# Patient Record
Sex: Male | Born: 1975 | Race: Black or African American | Hispanic: No | Smoking: Current every day smoker
Health system: Southern US, Community
[De-identification: ages and names within clinical notes are randomized; demographics above are authoritative.]

## PROBLEM LIST (undated history)

## (undated) DIAGNOSIS — D571 Sickle-cell disease without crisis: Secondary | ICD-10-CM

## (undated) DIAGNOSIS — R011 Cardiac murmur, unspecified: Secondary | ICD-10-CM

## (undated) HISTORY — PX: CHOLECYSTECTOMY: SHX55

## (undated) HISTORY — DX: Sickle-cell disease without crisis: D57.1

---

## 2008-04-29 ENCOUNTER — Ambulatory Visit (HOSPITAL_BASED_OUTPATIENT_CLINIC_OR_DEPARTMENT_OTHER): Payer: Medicaid Other | Admitting: Hematology and Oncology

## 2010-08-10 ENCOUNTER — Ambulatory Visit: Payer: Self-pay | Admitting: Hematology & Oncology

## 2010-08-23 ENCOUNTER — Ambulatory Visit: Payer: Self-pay | Admitting: Hematology & Oncology

## 2010-09-06 ENCOUNTER — Encounter (HOSPITAL_COMMUNITY)
Admission: RE | Admit: 2010-09-06 | Discharge: 2010-09-06 | Disposition: A | Discharge status: Home / Self Care | Payer: Medicaid Other | Source: Ambulatory Visit | Attending: Hematology & Oncology | Admitting: Hematology & Oncology

## 2010-09-06 ENCOUNTER — Ambulatory Visit (HOSPITAL_BASED_OUTPATIENT_CLINIC_OR_DEPARTMENT_OTHER): Payer: Medicaid Other | Admitting: Hematology & Oncology

## 2010-09-06 ENCOUNTER — Other Ambulatory Visit: Payer: Self-pay | Admitting: Hematology & Oncology

## 2010-09-06 DIAGNOSIS — D57219 Sickle-cell/Hb-C disease with crisis, unspecified: Secondary | ICD-10-CM

## 2010-09-06 DIAGNOSIS — D649 Anemia, unspecified: Secondary | ICD-10-CM | POA: Insufficient documentation

## 2010-09-06 LAB — CBC WITH DIFFERENTIAL (CANCER CENTER ONLY)
BASO#: 0.1 10*3/uL (ref 0.0–0.2)
BASO%: 0.6 % (ref 0.0–2.0)
EOS%: 3 % (ref 0.0–7.0)
Eosinophils Absolute: 0.6 10*3/uL — ABNORMAL HIGH (ref 0.0–0.5)
HCT: 18.5 % — ABNORMAL LOW (ref 38.7–49.9)
HGB: 6.5 g/dL — CL (ref 13.0–17.1)
LYMPH#: 4.3 10*3/uL — ABNORMAL HIGH (ref 0.9–3.3)
LYMPH%: 22.2 % (ref 14.0–48.0)
MCH: 33.3 pg (ref 28.0–33.4)
MCHC: 35.1 g/dL (ref 32.0–35.9)
MCV: 95 fL (ref 82–98)
MONO#: 2 10*3/uL — ABNORMAL HIGH (ref 0.1–0.9)
MONO%: 10.5 % (ref 0.0–13.0)
NEUT#: 12.4 10*3/uL — ABNORMAL HIGH (ref 1.5–6.5)
NEUT%: 63.7 % (ref 40.0–80.0)
Platelets: 432 10*3/uL — ABNORMAL HIGH (ref 145–400)
RBC: 1.95 10*6/uL — ABNORMAL LOW (ref 4.20–5.70)
RDW: 20.7 % — ABNORMAL HIGH (ref 11.1–15.7)
WBC: 19.4 10*3/uL — ABNORMAL HIGH (ref 4.0–10.0)

## 2010-09-06 LAB — ABO/RH: ABO/RH(D): O POS

## 2010-09-07 ENCOUNTER — Encounter (HOSPITAL_BASED_OUTPATIENT_CLINIC_OR_DEPARTMENT_OTHER): Payer: Medicaid Other | Admitting: Hematology & Oncology

## 2010-09-07 DIAGNOSIS — D571 Sickle-cell disease without crisis: Secondary | ICD-10-CM

## 2010-09-08 LAB — COMPREHENSIVE METABOLIC PANEL
ALT: 23 U/L (ref 0–53)
Albumin: 3.9 g/dL (ref 3.5–5.2)
Alkaline Phosphatase: 112 U/L (ref 39–117)
CO2: 21 mEq/L (ref 19–32)
Glucose, Bld: 96 mg/dL (ref 70–99)
Potassium: 4 mEq/L (ref 3.5–5.3)
Sodium: 138 mEq/L (ref 135–145)
Total Bilirubin: 7 mg/dL — ABNORMAL HIGH (ref 0.3–1.2)
Total Protein: 7.4 g/dL (ref 6.0–8.3)

## 2010-09-08 LAB — CROSSMATCH
ABO/RH(D): O POS
Antibody Screen: NEGATIVE
Unit division: 0

## 2010-09-08 LAB — HEMOGLOBINOPATHY EVALUATION
Hemoglobin Other: 0 % (ref 0.0–0.0)
Hgb A: 26.2 % — ABNORMAL LOW (ref 96.8–97.8)
Hgb S Quant: 61.5 % — ABNORMAL HIGH (ref 0.0–0.0)

## 2010-09-08 LAB — TYPE & CROSSMATCH - CHCC SATELLITE

## 2010-09-08 LAB — LACTATE DEHYDROGENASE: LDH: 297 U/L — ABNORMAL HIGH (ref 94–250)

## 2010-09-10 ENCOUNTER — Encounter (HOSPITAL_COMMUNITY): Payer: Medicaid Other

## 2010-09-11 ENCOUNTER — Emergency Department (HOSPITAL_BASED_OUTPATIENT_CLINIC_OR_DEPARTMENT_OTHER)
Admission: EM | Admit: 2010-09-11 | Discharge: 2010-09-12 | Disposition: A | Discharge status: Home / Self Care | Payer: Medicaid Other | Attending: Emergency Medicine | Admitting: Emergency Medicine

## 2010-09-11 DIAGNOSIS — M545 Low back pain, unspecified: Secondary | ICD-10-CM | POA: Insufficient documentation

## 2010-09-11 DIAGNOSIS — F17200 Nicotine dependence, unspecified, uncomplicated: Secondary | ICD-10-CM | POA: Insufficient documentation

## 2010-09-11 DIAGNOSIS — D5700 Hb-SS disease with crisis, unspecified: Secondary | ICD-10-CM | POA: Insufficient documentation

## 2010-09-12 LAB — RETICULOCYTES
RBC.: 2.4 MIL/uL — ABNORMAL LOW (ref 4.22–5.81)
Retic Count, Absolute: 268.8 10*3/uL — ABNORMAL HIGH (ref 19.0–186.0)
Retic Ct Pct: 11.2 % — ABNORMAL HIGH (ref 0.4–3.1)

## 2010-09-12 LAB — BASIC METABOLIC PANEL
CO2: 25 mEq/L (ref 19–32)
Chloride: 110 mEq/L (ref 96–112)
Creatinine, Ser: 0.6 mg/dL (ref 0.4–1.5)
GFR calc Af Amer: >60 mL/min (ref >60)
Sodium: 145 mEq/L (ref 135–145)

## 2010-09-12 LAB — CBC
Hemoglobin: 7.5 g/dL — ABNORMAL LOW (ref 13.0–17.0)
MCH: 32.2 pg (ref 26.0–34.0)
Platelets: 335 10*3/uL (ref 150–400)
RBC: 2.33 MIL/uL — ABNORMAL LOW (ref 4.22–5.81)
WBC: 19.9 10*3/uL — ABNORMAL HIGH (ref 4.0–10.5)

## 2010-09-12 LAB — DIFFERENTIAL
Basophils Absolute: 0.2 10*3/uL — ABNORMAL HIGH (ref 0.0–0.1)
Eosinophils Absolute: 0.6 10*3/uL (ref 0.0–0.7)
Lymphocytes Relative: 32 % (ref 12–46)
Monocytes Relative: 10 % (ref 3–12)
Neutro Abs: 10.7 10*3/uL — ABNORMAL HIGH (ref 1.7–7.7)
Neutrophils Relative %: 54 % (ref 43–77)

## 2010-09-21 ENCOUNTER — Other Ambulatory Visit: Payer: Self-pay | Admitting: Hematology & Oncology

## 2010-09-21 ENCOUNTER — Encounter (HOSPITAL_BASED_OUTPATIENT_CLINIC_OR_DEPARTMENT_OTHER): Payer: Medicaid Other | Admitting: Hematology & Oncology

## 2010-09-21 DIAGNOSIS — D571 Sickle-cell disease without crisis: Secondary | ICD-10-CM

## 2010-09-21 DIAGNOSIS — D57219 Sickle-cell/Hb-C disease with crisis, unspecified: Secondary | ICD-10-CM

## 2010-09-21 LAB — CBC WITH DIFFERENTIAL (CANCER CENTER ONLY)
BASO%: 0.4 % (ref 0.0–2.0)
LYMPH%: 20.1 % (ref 14.0–48.0)
MCH: 33.3 pg (ref 28.0–33.4)
MCV: 95 fL (ref 82–98)
MONO#: 2.5 10*3/uL — ABNORMAL HIGH (ref 0.1–0.9)
MONO%: 11.5 % (ref 0.0–13.0)
NEUT#: 14.4 10*3/uL — ABNORMAL HIGH (ref 1.5–6.5)
Platelets: 315 10*3/uL (ref 145–400)
RDW: 18.9 % — ABNORMAL HIGH (ref 11.1–15.7)
WBC: 22.1 10*3/uL — ABNORMAL HIGH (ref 4.0–10.0)

## 2010-09-21 LAB — RETICULOCYTES (CHCC): Retic Ct Pct: 13.7 % — ABNORMAL HIGH (ref 0.4–3.1)

## 2010-09-21 LAB — CHCC SATELLITE - SMEAR

## 2010-09-29 ENCOUNTER — Encounter (HOSPITAL_BASED_OUTPATIENT_CLINIC_OR_DEPARTMENT_OTHER): Payer: Medicaid Other | Admitting: Hematology & Oncology

## 2010-09-29 ENCOUNTER — Other Ambulatory Visit: Payer: Self-pay | Admitting: Hematology & Oncology

## 2010-09-29 DIAGNOSIS — D57219 Sickle-cell/Hb-C disease with crisis, unspecified: Secondary | ICD-10-CM

## 2010-09-29 DIAGNOSIS — D571 Sickle-cell disease without crisis: Secondary | ICD-10-CM

## 2010-09-29 LAB — CBC WITH DIFFERENTIAL (CANCER CENTER ONLY)
BASO#: 0.1 10*3/uL (ref 0.0–0.2)
Eosinophils Absolute: 0.2 10*3/uL (ref 0.0–0.5)
HCT: 20 % — ABNORMAL LOW (ref 38.7–49.9)
HGB: 7.2 g/dL — ABNORMAL LOW (ref 13.0–17.1)
LYMPH%: 22.7 % (ref 14.0–48.0)
MCH: 34.4 pg — ABNORMAL HIGH (ref 28.0–33.4)
MCV: 96 fL (ref 82–98)
MONO%: 9.6 % (ref 0.0–13.0)
NEUT%: 65.9 % (ref 40.0–80.0)
RBC: 2.09 10*6/uL — ABNORMAL LOW (ref 4.20–5.70)

## 2010-09-30 ENCOUNTER — Inpatient Hospital Stay (HOSPITAL_COMMUNITY)
Admission: AD | Admit: 2010-09-30 | Payer: Self-pay | Source: Other Acute Inpatient Hospital | Admitting: Internal Medicine

## 2010-09-30 ENCOUNTER — Emergency Department (INDEPENDENT_AMBULATORY_CARE_PROVIDER_SITE_OTHER): Payer: Medicaid Other

## 2010-09-30 ENCOUNTER — Emergency Department (HOSPITAL_BASED_OUTPATIENT_CLINIC_OR_DEPARTMENT_OTHER)
Admission: EM | Admit: 2010-09-30 | Discharge: 2010-09-30 | Disposition: A | Discharge status: Home / Self Care | Payer: Medicaid Other | Attending: Emergency Medicine | Admitting: Emergency Medicine

## 2010-09-30 DIAGNOSIS — J45909 Unspecified asthma, uncomplicated: Secondary | ICD-10-CM | POA: Insufficient documentation

## 2010-09-30 DIAGNOSIS — M545 Low back pain, unspecified: Secondary | ICD-10-CM | POA: Insufficient documentation

## 2010-09-30 DIAGNOSIS — D571 Sickle-cell disease without crisis: Secondary | ICD-10-CM

## 2010-09-30 DIAGNOSIS — D5700 Hb-SS disease with crisis, unspecified: Secondary | ICD-10-CM | POA: Insufficient documentation

## 2010-09-30 DIAGNOSIS — F17200 Nicotine dependence, unspecified, uncomplicated: Secondary | ICD-10-CM | POA: Insufficient documentation

## 2010-09-30 LAB — DIFFERENTIAL
Basophils Absolute: 0.2 10*3/uL — ABNORMAL HIGH (ref 0.0–0.1)
Lymphocytes Relative: 26 % (ref 12–46)
Monocytes Relative: 11 % (ref 3–12)
Neutro Abs: 11.7 10*3/uL — ABNORMAL HIGH (ref 1.7–7.7)

## 2010-09-30 LAB — CBC
HCT: 20 % — ABNORMAL LOW (ref 39.0–52.0)
Hemoglobin: 7.2 g/dL — ABNORMAL LOW (ref 13.0–17.0)
MCHC: 36 g/dL (ref 30.0–36.0)
WBC: 19.8 10*3/uL — ABNORMAL HIGH (ref 4.0–10.5)

## 2010-09-30 LAB — POCT TOXICOLOGY PANEL: Tetrahydrocannabinol: POSITIVE

## 2010-09-30 LAB — URINE MICROSCOPIC-ADD ON: RBC / HPF: NONE SEEN RBC/hpf (ref <3)

## 2010-09-30 LAB — COMPREHENSIVE METABOLIC PANEL
AST: 65 U/L — ABNORMAL HIGH (ref 0–37)
Albumin: 4.1 g/dL (ref 3.5–5.2)
BUN: 9 mg/dL (ref 6–23)
Calcium: 8.8 mg/dL (ref 8.4–10.5)
Creatinine, Ser: 0.8 mg/dL (ref 0.4–1.5)
Total Bilirubin: 6 mg/dL — ABNORMAL HIGH (ref 0.3–1.2)
Total Protein: 8.2 g/dL (ref 6.0–8.3)

## 2010-09-30 LAB — URINALYSIS, ROUTINE W REFLEX MICROSCOPIC
Glucose, UA: NEGATIVE mg/dL
Hgb urine dipstick: NEGATIVE
Ketones, ur: NEGATIVE mg/dL
Leukocytes, UA: NEGATIVE
pH: 6 (ref 5.0–8.0)

## 2010-09-30 LAB — RETICULOCYTES
RBC.: 2.12 MIL/uL — ABNORMAL LOW (ref 4.22–5.81)
Retic Ct Pct: 22 % — ABNORMAL HIGH (ref 0.4–3.1)

## 2010-10-01 LAB — RETICULOCYTES (CHCC): RBC.: 2.18 MIL/uL — ABNORMAL LOW (ref 4.22–5.81)

## 2010-10-01 LAB — HEMOGLOBINOPATHY EVALUATION
Hemoglobin Other: 0 % (ref 0.0–0.0)
Hgb A2 Quant: 2.7 % (ref 2.2–3.2)
Hgb A: 26.5 % — ABNORMAL LOW (ref 96.8–97.8)
Hgb F Quant: 10.5 % — ABNORMAL HIGH (ref 0.0–2.0)
Hgb S Quant: 60.3 % — ABNORMAL HIGH (ref 0.0–0.0)

## 2010-10-01 LAB — FERRITIN: Ferritin: 3623 ng/mL — ABNORMAL HIGH (ref 22–322)

## 2010-10-06 ENCOUNTER — Ambulatory Visit (HOSPITAL_COMMUNITY)
Admission: RE | Admit: 2010-10-06 | Discharge: 2010-10-06 | Disposition: A | Discharge status: Home / Self Care | Payer: Medicaid Other | Source: Ambulatory Visit | Attending: Hematology & Oncology | Admitting: Hematology & Oncology

## 2010-10-06 DIAGNOSIS — J45909 Unspecified asthma, uncomplicated: Secondary | ICD-10-CM | POA: Insufficient documentation

## 2010-10-06 DIAGNOSIS — R011 Cardiac murmur, unspecified: Secondary | ICD-10-CM | POA: Insufficient documentation

## 2010-10-06 DIAGNOSIS — I319 Disease of pericardium, unspecified: Secondary | ICD-10-CM

## 2010-10-06 DIAGNOSIS — D5720 Sickle-cell/Hb-C disease without crisis: Secondary | ICD-10-CM | POA: Insufficient documentation

## 2010-10-11 ENCOUNTER — Encounter (HOSPITAL_BASED_OUTPATIENT_CLINIC_OR_DEPARTMENT_OTHER): Payer: Medicaid Other | Admitting: Hematology & Oncology

## 2010-10-11 DIAGNOSIS — D571 Sickle-cell disease without crisis: Secondary | ICD-10-CM

## 2010-10-13 ENCOUNTER — Encounter: Payer: Medicaid Other | Admitting: Hematology & Oncology

## 2010-10-20 ENCOUNTER — Encounter (HOSPITAL_BASED_OUTPATIENT_CLINIC_OR_DEPARTMENT_OTHER): Payer: Medicaid Other | Admitting: Hematology & Oncology

## 2010-10-20 ENCOUNTER — Other Ambulatory Visit: Payer: Self-pay | Admitting: Hematology & Oncology

## 2010-10-20 DIAGNOSIS — D571 Sickle-cell disease without crisis: Secondary | ICD-10-CM

## 2010-10-20 LAB — CBC WITH DIFFERENTIAL (CANCER CENTER ONLY)
BASO#: 0.1 10*3/uL (ref 0.0–0.2)
BASO%: 0.5 % (ref 0.0–2.0)
EOS%: 3.5 % (ref 0.0–7.0)
HGB: 6.6 g/dL — CL (ref 13.0–17.1)
LYMPH#: 5.2 10*3/uL — ABNORMAL HIGH (ref 0.9–3.3)
MCH: 34.7 pg — ABNORMAL HIGH (ref 28.0–33.4)
MCHC: 35.5 g/dL (ref 32.0–35.9)
MONO%: 10.5 % (ref 0.0–13.0)
NEUT#: 9 10*3/uL — ABNORMAL HIGH (ref 1.5–6.5)
Platelets: 358 10*3/uL (ref 145–400)
RDW: 21.9 % — ABNORMAL HIGH (ref 11.1–15.7)

## 2010-10-20 LAB — CHCC SATELLITE - SMEAR

## 2010-10-20 LAB — TECHNOLOGIST REVIEW CHCC SATELLITE

## 2010-10-21 ENCOUNTER — Encounter (HOSPITAL_COMMUNITY)
Admission: RE | Admit: 2010-10-21 | Discharge: 2010-10-21 | Disposition: A | Discharge status: Home / Self Care | Payer: Medicaid Other | Source: Ambulatory Visit | Attending: Hematology & Oncology | Admitting: Hematology & Oncology

## 2010-10-21 ENCOUNTER — Other Ambulatory Visit: Payer: Self-pay | Admitting: Hematology & Oncology

## 2010-10-21 ENCOUNTER — Encounter (HOSPITAL_BASED_OUTPATIENT_CLINIC_OR_DEPARTMENT_OTHER): Payer: Medicaid Other | Admitting: Hematology & Oncology

## 2010-10-21 DIAGNOSIS — D571 Sickle-cell disease without crisis: Secondary | ICD-10-CM

## 2010-10-21 DIAGNOSIS — D649 Anemia, unspecified: Secondary | ICD-10-CM | POA: Insufficient documentation

## 2010-10-22 ENCOUNTER — Encounter (HOSPITAL_BASED_OUTPATIENT_CLINIC_OR_DEPARTMENT_OTHER): Payer: Medicaid Other | Admitting: Hematology & Oncology

## 2010-10-22 DIAGNOSIS — D571 Sickle-cell disease without crisis: Secondary | ICD-10-CM

## 2010-10-22 LAB — TYPE & CROSSMATCH - CHCC SATELLITE

## 2010-10-23 LAB — CROSSMATCH
ABO/RH(D): O POS
Unit division: 0

## 2010-10-25 ENCOUNTER — Encounter (HOSPITAL_COMMUNITY): Payer: Medicaid Other

## 2010-10-25 LAB — RETICULOCYTES (CHCC)
RBC.: 1.94 MIL/uL — ABNORMAL LOW (ref 4.22–5.81)
Retic Ct Pct: 19.8 % — ABNORMAL HIGH (ref 0.4–3.1)

## 2010-10-25 LAB — HEMOGLOBINOPATHY EVALUATION
Hemoglobin Other: 0 % (ref 0.0–0.0)
Hgb A: 9.9 % — ABNORMAL LOW (ref 96.8–97.8)
Hgb S Quant: 76.1 % — ABNORMAL HIGH (ref 0.0–0.0)

## 2010-10-25 LAB — LACTATE DEHYDROGENASE: LDH: 436 U/L — ABNORMAL HIGH (ref 94–250)

## 2010-10-25 LAB — HAPTOGLOBIN: Haptoglobin: <6 mg/dL — ABNORMAL LOW (ref 16–200)

## 2010-11-10 ENCOUNTER — Encounter: Payer: Medicaid Other | Admitting: Hematology & Oncology

## 2010-11-10 ENCOUNTER — Other Ambulatory Visit: Payer: Self-pay | Admitting: Hematology & Oncology

## 2010-11-10 DIAGNOSIS — D649 Anemia, unspecified: Secondary | ICD-10-CM

## 2010-11-10 LAB — CBC WITH DIFFERENTIAL (CANCER CENTER ONLY)
BASO#: 0.1 10*3/uL (ref 0.0–0.2)
Eosinophils Absolute: 0.7 10*3/uL — ABNORMAL HIGH (ref 0.0–0.5)
HCT: 17.7 % — ABNORMAL LOW (ref 38.7–49.9)
HGB: 6.4 g/dL — CL (ref 13.0–17.1)
LYMPH#: 4.1 10*3/uL — ABNORMAL HIGH (ref 0.9–3.3)
NEUT#: 9.4 10*3/uL — ABNORMAL HIGH (ref 1.5–6.5)
NEUT%: 59.2 % (ref 40.0–80.0)
RBC: 1.86 10*6/uL — ABNORMAL LOW (ref 4.20–5.70)

## 2010-11-11 ENCOUNTER — Encounter (HOSPITAL_BASED_OUTPATIENT_CLINIC_OR_DEPARTMENT_OTHER): Payer: Medicaid Other | Admitting: Hematology & Oncology

## 2010-11-11 ENCOUNTER — Other Ambulatory Visit: Payer: Self-pay | Admitting: Hematology & Oncology

## 2010-11-11 DIAGNOSIS — D571 Sickle-cell disease without crisis: Secondary | ICD-10-CM

## 2010-11-11 DIAGNOSIS — D649 Anemia, unspecified: Secondary | ICD-10-CM

## 2010-11-11 DIAGNOSIS — D57219 Sickle-cell/Hb-C disease with crisis, unspecified: Secondary | ICD-10-CM

## 2010-11-11 LAB — BASIC METABOLIC PANEL - CANCER CENTER ONLY
CO2: 26 mEq/L (ref 18–33)
Calcium: 8 mg/dL (ref 8.0–10.3)
Creat: UNDETERMINED mg/dl (ref 0.6–1.2)

## 2010-11-11 LAB — TYPE & CROSSMATCH - CHCC SATELLITE

## 2010-11-12 LAB — CROSSMATCH
ABO/RH(D): O POS
Antibody Screen: NEGATIVE
Unit division: 0
Unit division: 0

## 2010-11-15 ENCOUNTER — Other Ambulatory Visit (HOSPITAL_COMMUNITY): Payer: Medicaid Other

## 2010-12-13 ENCOUNTER — Other Ambulatory Visit: Payer: Self-pay | Admitting: Hematology & Oncology

## 2010-12-13 ENCOUNTER — Encounter (HOSPITAL_COMMUNITY)
Admission: RE | Admit: 2010-12-13 | Discharge: 2010-12-13 | Disposition: A | Discharge status: Home / Self Care | Payer: Medicaid Other | Source: Ambulatory Visit | Attending: Hematology & Oncology | Admitting: Hematology & Oncology

## 2010-12-13 ENCOUNTER — Encounter: Payer: Medicaid Other | Admitting: Hematology & Oncology

## 2010-12-13 DIAGNOSIS — D571 Sickle-cell disease without crisis: Secondary | ICD-10-CM | POA: Insufficient documentation

## 2010-12-13 LAB — CBC WITH DIFFERENTIAL (CANCER CENTER ONLY)
BASO#: 0.1 10*3/uL (ref 0.0–0.2)
EOS%: 2.2 % (ref 0.0–7.0)
HGB: 5.8 g/dL — CL (ref 13.0–17.1)
LYMPH#: 6.5 10*3/uL — ABNORMAL HIGH (ref 0.9–3.3)
MCHC: 36.3 g/dL — ABNORMAL HIGH (ref 32.0–35.9)
MONO#: 1.6 10*3/uL — ABNORMAL HIGH (ref 0.1–0.9)
NEUT#: 9 10*3/uL — ABNORMAL HIGH (ref 1.5–6.5)
RBC: 1.66 10*6/uL — ABNORMAL LOW (ref 4.20–5.70)
WBC: 17.6 10*3/uL — ABNORMAL HIGH (ref 4.0–10.0)

## 2010-12-13 LAB — HOLD TUBE, BLOOD BANK - CHCC SATELLITE

## 2010-12-14 ENCOUNTER — Other Ambulatory Visit: Payer: Self-pay | Admitting: Hematology & Oncology

## 2010-12-14 ENCOUNTER — Encounter (HOSPITAL_BASED_OUTPATIENT_CLINIC_OR_DEPARTMENT_OTHER): Payer: Medicaid Other | Admitting: Hematology & Oncology

## 2010-12-14 DIAGNOSIS — D571 Sickle-cell disease without crisis: Secondary | ICD-10-CM

## 2010-12-15 LAB — CROSSMATCH: Antibody Screen: NEGATIVE

## 2010-12-17 ENCOUNTER — Encounter (HOSPITAL_COMMUNITY): Payer: Medicaid Other

## 2010-12-21 ENCOUNTER — Encounter (HOSPITAL_COMMUNITY): Payer: Medicaid Other

## 2010-12-27 ENCOUNTER — Encounter (HOSPITAL_COMMUNITY): Payer: Medicaid Other

## 2011-01-18 ENCOUNTER — Other Ambulatory Visit: Payer: Self-pay | Admitting: Hematology & Oncology

## 2011-01-18 ENCOUNTER — Encounter (HOSPITAL_COMMUNITY)
Admission: RE | Admit: 2011-01-18 | Discharge: 2011-01-18 | Disposition: A | Discharge status: Home / Self Care | Payer: Medicaid Other | Source: Ambulatory Visit | Attending: Hematology & Oncology | Admitting: Hematology & Oncology

## 2011-01-18 ENCOUNTER — Encounter: Payer: Medicaid Other | Admitting: Hematology & Oncology

## 2011-01-18 DIAGNOSIS — D649 Anemia, unspecified: Secondary | ICD-10-CM | POA: Insufficient documentation

## 2011-01-18 LAB — CBC WITH DIFFERENTIAL (CANCER CENTER ONLY)
BASO#: 0 10*3/uL (ref 0.0–0.2)
BASO%: 0.5 % (ref 0.0–2.0)
HCT: 17.2 % — ABNORMAL LOW (ref 38.7–49.9)
HGB: 6.2 g/dL — CL (ref 13.0–17.1)
LYMPH%: 23.2 % (ref 14.0–48.0)
MCHC: 36 g/dL — ABNORMAL HIGH (ref 32.0–35.9)
MCV: 98 fL (ref 82–98)
MONO#: 0.9 10*3/uL (ref 0.1–0.9)
NEUT%: 63 % (ref 40.0–80.0)
RDW: 19.9 % — ABNORMAL HIGH (ref 11.1–15.7)
WBC: 16.3 10*3/uL — ABNORMAL HIGH (ref 4.0–10.0)

## 2011-01-18 LAB — HOLD TUBE, BLOOD BANK - CHCC SATELLITE

## 2011-01-19 ENCOUNTER — Encounter (HOSPITAL_BASED_OUTPATIENT_CLINIC_OR_DEPARTMENT_OTHER): Payer: Medicaid Other | Admitting: Hematology & Oncology

## 2011-01-19 DIAGNOSIS — D571 Sickle-cell disease without crisis: Secondary | ICD-10-CM

## 2011-01-20 ENCOUNTER — Other Ambulatory Visit: Payer: Self-pay | Admitting: Hematology & Oncology

## 2011-01-20 DIAGNOSIS — D571 Sickle-cell disease without crisis: Secondary | ICD-10-CM

## 2011-01-20 LAB — CROSSMATCH
ABO/RH(D): O POS
Antibody Screen: NEGATIVE
Unit division: 0

## 2011-01-27 ENCOUNTER — Ambulatory Visit (HOSPITAL_COMMUNITY)
Admission: RE | Admit: 2011-01-27 | Discharge: 2011-01-27 | Disposition: A | Discharge status: Home / Self Care | Payer: Medicaid Other | Source: Ambulatory Visit | Attending: Hematology & Oncology | Admitting: Hematology & Oncology

## 2011-01-27 DIAGNOSIS — D571 Sickle-cell disease without crisis: Secondary | ICD-10-CM

## 2011-02-03 ENCOUNTER — Encounter (HOSPITAL_BASED_OUTPATIENT_CLINIC_OR_DEPARTMENT_OTHER): Payer: Medicaid Other | Admitting: Hematology & Oncology

## 2011-02-03 ENCOUNTER — Other Ambulatory Visit: Payer: Self-pay | Admitting: Hematology & Oncology

## 2011-02-03 DIAGNOSIS — D571 Sickle-cell disease without crisis: Secondary | ICD-10-CM

## 2011-02-03 LAB — CBC WITH DIFFERENTIAL (CANCER CENTER ONLY)
BASO#: 0.1 10*3/uL (ref 0.0–0.2)
BASO%: 0.4 % (ref 0.0–2.0)
EOS%: 1.2 % (ref 0.0–7.0)
HGB: 6.4 g/dL — CL (ref 13.0–17.1)
MCH: 34.2 pg — ABNORMAL HIGH (ref 28.0–33.4)
MCHC: 36.4 g/dL — ABNORMAL HIGH (ref 32.0–35.9)
MONO%: 11.5 % (ref 0.0–13.0)
NEUT#: 11.5 10*3/uL — ABNORMAL HIGH (ref 1.5–6.5)
RDW: 20.2 % — ABNORMAL HIGH (ref 11.1–15.7)

## 2011-02-04 ENCOUNTER — Encounter (HOSPITAL_BASED_OUTPATIENT_CLINIC_OR_DEPARTMENT_OTHER): Payer: Medicaid Other | Admitting: Hematology & Oncology

## 2011-02-04 DIAGNOSIS — D571 Sickle-cell disease without crisis: Secondary | ICD-10-CM

## 2011-02-04 LAB — TYPE & CROSSMATCH - CHCC SATELLITE

## 2011-02-05 LAB — CROSSMATCH
ABO/RH(D): O POS
Unit division: 0
Unit division: 0

## 2011-02-10 ENCOUNTER — Other Ambulatory Visit: Payer: Self-pay | Admitting: Hematology & Oncology

## 2011-02-10 ENCOUNTER — Encounter (HOSPITAL_BASED_OUTPATIENT_CLINIC_OR_DEPARTMENT_OTHER): Payer: Medicaid Other | Admitting: Hematology & Oncology

## 2011-02-10 DIAGNOSIS — D571 Sickle-cell disease without crisis: Secondary | ICD-10-CM

## 2011-02-10 LAB — CBC WITH DIFFERENTIAL (CANCER CENTER ONLY)
BASO#: 0.1 10*3/uL (ref 0.0–0.2)
Eosinophils Absolute: 0.3 10*3/uL (ref 0.0–0.5)
HCT: 20.3 % — ABNORMAL LOW (ref 38.7–49.9)
HGB: 7.4 g/dL — ABNORMAL LOW (ref 13.0–17.1)
LYMPH#: 3 10*3/uL (ref 0.9–3.3)
MONO#: 3 10*3/uL — ABNORMAL HIGH (ref 0.1–0.9)
NEUT%: 71.6 % (ref 40.0–80.0)
WBC: 22.3 10*3/uL — ABNORMAL HIGH (ref 4.0–10.0)

## 2011-02-17 ENCOUNTER — Encounter: Payer: Medicaid Other | Admitting: Hematology & Oncology

## 2011-02-17 ENCOUNTER — Other Ambulatory Visit: Payer: Self-pay | Admitting: Hematology & Oncology

## 2011-02-17 ENCOUNTER — Encounter (HOSPITAL_COMMUNITY)
Admission: RE | Admit: 2011-02-17 | Discharge: 2011-02-17 | Disposition: A | Discharge status: Home / Self Care | Payer: Medicaid Other | Source: Ambulatory Visit | Attending: Hematology & Oncology | Admitting: Hematology & Oncology

## 2011-02-17 DIAGNOSIS — D649 Anemia, unspecified: Secondary | ICD-10-CM

## 2011-02-17 LAB — CBC WITH DIFFERENTIAL (CANCER CENTER ONLY)
BASO#: 0.1 10*3/uL (ref 0.0–0.2)
EOS%: 1.3 % (ref 0.0–7.0)
HCT: 19.5 % — ABNORMAL LOW (ref 38.7–49.9)
HGB: 6.9 g/dL — CL (ref 13.0–17.1)
MCH: 33.8 pg — ABNORMAL HIGH (ref 28.0–33.4)
MCHC: 35.4 g/dL (ref 32.0–35.9)
MCV: 96 fL (ref 82–98)
MONO%: 9.7 % (ref 0.0–13.0)
NEUT%: 59 % (ref 40.0–80.0)

## 2011-02-17 LAB — RETICULOCYTES (CHCC): Retic Ct Pct: 18.8 % — ABNORMAL HIGH (ref 0.4–2.3)

## 2011-02-18 ENCOUNTER — Encounter (HOSPITAL_BASED_OUTPATIENT_CLINIC_OR_DEPARTMENT_OTHER): Payer: Medicaid Other | Admitting: Hematology & Oncology

## 2011-02-18 DIAGNOSIS — D571 Sickle-cell disease without crisis: Secondary | ICD-10-CM

## 2011-02-18 LAB — TYPE & CROSSMATCH - CHCC SATELLITE

## 2011-02-19 LAB — CROSSMATCH
ABO/RH(D): O POS
Unit division: 0
Unit division: 0

## 2011-03-04 ENCOUNTER — Encounter (HOSPITAL_BASED_OUTPATIENT_CLINIC_OR_DEPARTMENT_OTHER): Payer: Medicaid Other | Admitting: Hematology & Oncology

## 2011-03-04 ENCOUNTER — Other Ambulatory Visit: Payer: Self-pay | Admitting: Hematology & Oncology

## 2011-03-04 DIAGNOSIS — D571 Sickle-cell disease without crisis: Secondary | ICD-10-CM

## 2011-03-04 LAB — CBC WITH DIFFERENTIAL (CANCER CENTER ONLY)
BASO#: 0.1 10*3/uL (ref 0.0–0.2)
BASO%: 0.3 % (ref 0.0–2.0)
EOS%: 1.8 % (ref 0.0–7.0)
HGB: 7.5 g/dL — ABNORMAL LOW (ref 13.0–17.1)
LYMPH#: 5.2 10*3/uL — ABNORMAL HIGH (ref 0.9–3.3)
MCHC: 35.7 g/dL (ref 32.0–35.9)
MONO#: 2.2 10*3/uL — ABNORMAL HIGH (ref 0.1–0.9)
NEUT#: 15.1 10*3/uL — ABNORMAL HIGH (ref 1.5–6.5)
WBC: 23 10*3/uL — ABNORMAL HIGH (ref 4.0–10.0)

## 2011-03-15 ENCOUNTER — Other Ambulatory Visit: Payer: Self-pay | Admitting: Hematology & Oncology

## 2011-03-15 ENCOUNTER — Emergency Department (INDEPENDENT_AMBULATORY_CARE_PROVIDER_SITE_OTHER): Payer: Medicaid Other

## 2011-03-15 ENCOUNTER — Encounter (HOSPITAL_BASED_OUTPATIENT_CLINIC_OR_DEPARTMENT_OTHER): Payer: Medicaid Other | Admitting: Hematology & Oncology

## 2011-03-15 ENCOUNTER — Inpatient Hospital Stay (HOSPITAL_COMMUNITY)
Admission: AD | Admit: 2011-03-15 | Payer: Self-pay | Source: Other Acute Inpatient Hospital | Admitting: Internal Medicine

## 2011-03-15 ENCOUNTER — Emergency Department (HOSPITAL_BASED_OUTPATIENT_CLINIC_OR_DEPARTMENT_OTHER)
Admission: EM | Admit: 2011-03-15 | Discharge: 2011-03-15 | Discharge status: Against Medical Advice | Payer: Medicaid Other | Source: Home / Self Care | Attending: Emergency Medicine | Admitting: Emergency Medicine

## 2011-03-15 ENCOUNTER — Encounter: Payer: Self-pay | Admitting: *Deleted

## 2011-03-15 ENCOUNTER — Inpatient Hospital Stay (HOSPITAL_COMMUNITY)
Admission: EM | Admit: 2011-03-15 | Discharge: 2011-03-18 | DRG: 812 | Disposition: A | Discharge status: Home / Self Care | Payer: Medicaid Other | Attending: Family Medicine | Admitting: Family Medicine

## 2011-03-15 DIAGNOSIS — R079 Chest pain, unspecified: Secondary | ICD-10-CM | POA: Insufficient documentation

## 2011-03-15 DIAGNOSIS — D571 Sickle-cell disease without crisis: Secondary | ICD-10-CM | POA: Insufficient documentation

## 2011-03-15 DIAGNOSIS — R1012 Left upper quadrant pain: Secondary | ICD-10-CM

## 2011-03-15 DIAGNOSIS — D7389 Other diseases of spleen: Secondary | ICD-10-CM | POA: Insufficient documentation

## 2011-03-15 DIAGNOSIS — D5700 Hb-SS disease with crisis, unspecified: Principal | ICD-10-CM | POA: Diagnosis present

## 2011-03-15 DIAGNOSIS — J45909 Unspecified asthma, uncomplicated: Secondary | ICD-10-CM | POA: Diagnosis present

## 2011-03-15 DIAGNOSIS — R109 Unspecified abdominal pain: Secondary | ICD-10-CM

## 2011-03-15 DIAGNOSIS — I517 Cardiomegaly: Secondary | ICD-10-CM

## 2011-03-15 DIAGNOSIS — F17200 Nicotine dependence, unspecified, uncomplicated: Secondary | ICD-10-CM | POA: Diagnosis present

## 2011-03-15 DIAGNOSIS — I319 Disease of pericardium, unspecified: Secondary | ICD-10-CM

## 2011-03-15 DIAGNOSIS — D735 Infarction of spleen: Secondary | ICD-10-CM

## 2011-03-15 HISTORY — DX: Sickle-cell disease without crisis: D57.1

## 2011-03-15 LAB — COMPREHENSIVE METABOLIC PANEL
ALT: 26 U/L (ref 0–53)
AST: 46 U/L — ABNORMAL HIGH (ref 0–37)
Alkaline Phosphatase: 98 U/L (ref 39–117)
CO2: 24 mEq/L (ref 19–32)
Glucose, Bld: 83 mg/dL (ref 70–99)
Potassium: 3.6 mEq/L (ref 3.5–5.1)
Sodium: 135 mEq/L (ref 135–145)
Total Protein: 7 g/dL (ref 6.0–8.3)

## 2011-03-15 LAB — URINALYSIS, ROUTINE W REFLEX MICROSCOPIC
Glucose, UA: NEGATIVE mg/dL
Ketones, ur: NEGATIVE mg/dL
Leukocytes, UA: NEGATIVE
Nitrite: NEGATIVE
Nitrite: NEGATIVE
Specific Gravity, Urine: 1.026 (ref 1.005–1.030)
Specific Gravity, Urine: 1.02 (ref 1.005–1.030)
Urobilinogen, UA: 1 mg/dL (ref 0.0–1.0)
pH: 5.5 (ref 5.0–8.0)

## 2011-03-15 LAB — RAPID URINE DRUG SCREEN, HOSP PERFORMED
Amphetamines: NOT DETECTED
Benzodiazepines: NOT DETECTED
Opiates: POSITIVE — AB

## 2011-03-15 LAB — HOLD TUBE, BLOOD BANK - CHCC SATELLITE

## 2011-03-15 LAB — CBC
HCT: 20.2 % — ABNORMAL LOW (ref 39.0–52.0)
Hemoglobin: 7.4 g/dL — ABNORMAL LOW (ref 13.0–17.0)
MCV: 98.1 fL (ref 78.0–100.0)
RBC: 2.06 MIL/uL — ABNORMAL LOW (ref 4.22–5.81)
RDW: 21.4 % — ABNORMAL HIGH (ref 11.5–15.5)
WBC: 20.6 10*3/uL — ABNORMAL HIGH (ref 4.0–10.5)

## 2011-03-15 LAB — CBC WITH DIFFERENTIAL (CANCER CENTER ONLY)
BASO%: 0.4 % (ref 0.0–2.0)
LYMPH%: 22.5 % (ref 14.0–48.0)
MCV: 98 fL (ref 82–98)
MONO#: 2.6 10*3/uL — ABNORMAL HIGH (ref 0.1–0.9)
MONO%: 12 % (ref 0.0–13.0)
NEUT#: 13.9 10*3/uL — ABNORMAL HIGH (ref 1.5–6.5)
Platelets: 368 10*3/uL (ref 145–400)
RBC: 2 10*6/uL — ABNORMAL LOW (ref 4.20–5.70)
RDW: 20.7 % — ABNORMAL HIGH (ref 11.1–15.7)
WBC: 21.7 10*3/uL — ABNORMAL HIGH (ref 4.0–10.0)

## 2011-03-15 LAB — RETICULOCYTES
RBC.: 2.06 MIL/uL — ABNORMAL LOW (ref 4.22–5.81)
Retic Count, Absolute: 725.1 10*3/uL — ABNORMAL HIGH (ref 19.0–186.0)

## 2011-03-15 LAB — BASIC METABOLIC PANEL
BUN: 8 mg/dL (ref 6–23)
CO2: 27 mEq/L (ref 19–32)
Calcium: 9.2 mg/dL (ref 8.4–10.5)
Chloride: 100 mEq/L (ref 96–112)
Potassium: 4 mEq/L (ref 3.5–5.3)
Potassium: 3.6 mEq/L (ref 3.5–5.1)
Sodium: 137 mEq/L (ref 135–145)
Sodium: 135 mEq/L (ref 135–145)

## 2011-03-15 LAB — DIFFERENTIAL
Basophils Absolute: 0.2 10*3/uL — ABNORMAL HIGH (ref 0.0–0.1)
Basophils Relative: 1 % (ref 0–1)
Lymphs Abs: 5.8 10*3/uL — ABNORMAL HIGH (ref 0.7–4.0)
Monocytes Relative: 10 % (ref 3–12)
Neutro Abs: 12.1 10*3/uL — ABNORMAL HIGH (ref 1.7–7.7)

## 2011-03-15 LAB — TECHNOLOGIST REVIEW CHCC SATELLITE

## 2011-03-15 MED ORDER — HYDROMORPHONE HCL 1 MG/ML IJ SOLN
1.0000 mg | Freq: Once | INTRAMUSCULAR | Status: AC
  Administered 2011-03-15: 1 mg via INTRAVENOUS
  Filled 2011-03-15: qty 1

## 2011-03-15 MED ORDER — DIPHENHYDRAMINE HCL 50 MG/ML IJ SOLN
25.0000 mg | Freq: Once | INTRAMUSCULAR | Status: AC
  Administered 2011-03-15: 25 mg via INTRAVENOUS

## 2011-03-15 MED ORDER — ONDANSETRON HCL 4 MG/2ML IJ SOLN
4.0000 mg | Freq: Once | INTRAMUSCULAR | Status: AC
  Administered 2011-03-15: 4 mg via INTRAVENOUS

## 2011-03-15 MED ORDER — DIPHENHYDRAMINE HCL 50 MG/ML IJ SOLN
INTRAMUSCULAR | Status: AC
  Administered 2011-03-15: 25 mg via INTRAVENOUS
  Filled 2011-03-15: qty 1

## 2011-03-15 MED ORDER — IOHEXOL 300 MG/ML  SOLN
100.0000 mL | Freq: Once | INTRAMUSCULAR | Status: AC | PRN
  Administered 2011-03-15: 100 mL via INTRAVENOUS

## 2011-03-15 MED ORDER — DIPHENHYDRAMINE HCL 50 MG/ML IJ SOLN
25.0000 mg | Freq: Once | INTRAMUSCULAR | Status: AC
  Administered 2011-03-15: 25 mg via INTRAVENOUS
  Filled 2011-03-15: qty 1

## 2011-03-15 MED ORDER — ONDANSETRON HCL 4 MG/2ML IJ SOLN
4.0000 mg | Freq: Once | INTRAMUSCULAR | Status: AC
  Administered 2011-03-15: 4 mg via INTRAVENOUS
  Filled 2011-03-15: qty 2

## 2011-03-15 MED ORDER — SODIUM CHLORIDE 0.9 % IV BOLUS (SEPSIS)
1000.0000 mL | Freq: Once | INTRAVENOUS | Status: AC
  Administered 2011-03-15: 1000 mL via INTRAVENOUS

## 2011-03-15 MED ORDER — ONDANSETRON HCL 4 MG/2ML IJ SOLN
INTRAMUSCULAR | Status: AC
  Administered 2011-03-15: 4 mg via INTRAVENOUS
  Filled 2011-03-15: qty 2

## 2011-03-15 NOTE — ED Notes (Signed)
Pt again adamant about not being admitted at this time. Pt sts he needs to "take care of some things at home first." Pt's friend sts he will bring pt back for admission after pt takes care of his things at home.

## 2011-03-15 NOTE — ED Notes (Signed)
Pt sts he cannot be admitted at this time. Pt's brother at bedside encouraging pt to be admitted.

## 2011-03-15 NOTE — ED Notes (Signed)
Pt amb to room 6 with quick steady gait in nad. Pt reports 3 days of luq pain that radiates through to his back. Denies any fevers, n/v/d. Pt sent from dr. Carolyn Stare office, has iv access in place.

## 2011-03-15 NOTE — ED Notes (Signed)
Patient leaving AMA; RN requested IV to be removed.

## 2011-03-15 NOTE — ED Provider Notes (Addendum)
History     CSN: [id number] Arrival date & time: 03/15/2011 11:40 AM  Chief Complaint  Patient presents with  . Abdominal Pain    (Consider location/radiation/quality/duration/timing/severity/associated sxs/prior treatment) HPI Patient with a history of sickle cell disease presenting with left upper abdominal pain. Patient states pain began 3 days ago and has been constant since that time. Denies fever vomiting or diarrhea. States pain is worse with taking deep breath. States that his normal pain crises involve low back pain and that this is different. Was seen this morning at his doctor's office and referred to the emergency department for CT scan of the abdomen. CBC was checked this morning and his hemoglobin is 7.2 which is near his baseline and they decided to hold on blood transfusion at this time.  Past Medical History  Diagnosis Date  . Sickle cell anemia   . Asthma     History reviewed. No pertinent past surgical history.  History reviewed. No pertinent family history.  History  Substance Use Topics  . Smoking status: Current Everyday Smoker  . Smokeless tobacco: Not on file  . Alcohol Use: No      Review of Systems ROS reviewed and otherwise negative except for mentioned in HPI  Allergies  Morphine and related  Home Medications   Current Outpatient Rx  Name Route Sig Dispense Refill  . DEFERASIROX 125 MG PO TBSO Oral Take 250 mg by mouth daily.     Marland Kitchen FOLIC ACID 1 MG PO TABS Oral Take 1 mg by mouth daily.      Marland Kitchen HYDROMORPHONE HCL 4 MG PO TABS Oral Take 4 mg by mouth every 4 (four) hours as needed.      Marland Kitchen HYDROXYUREA 500 MG PO CAPS Oral Take 500 mg by mouth daily. May take with food to minimize GI side effects.       BP 119/61  Pulse 88  Temp(Src) 98.4 F (36.9 C) (Oral)  SpO2 92% Vitals reviewed Physical Exam Physical Examination: General appearance - alert, well appearing, and in no distress Mental status - alert, oriented to person, place, and  time Eyes - pupils equal and reactive, extraocular eye movements intact, scleral icterus noted, conjunctival pallor Mouth - mucous membranes moist, pharynx normal without lesions Chest - clear to auscultation, no wheezes, rales or rhonchi, symmetric air entry Heart - normal rate, regular rhythm, normal S1, S2, no murmurs, rubs, clicks or gallops Abdomen - ttp in LUQ, spleen tip palpable, no gaurding, no rebound tenderness noted bowel sounds normal, soft, nondistended Back exam - full range of motion, mild ttp in left lumbar paraspinal region, no palpable spasm or pain on motion, no CVA tenderness Musculoskeletal - no joint tenderness, deformity or swelling, no muscular tenderness noted Extremities - peripheral pulses normal, no pedal edema, no clubbing or cyanosis Skin - normal coloration and turgor, no rashes, no suspicious skin lesions noted ED Course  Procedures (including critical care time) Cbc from Dr. Tama Gander office reviewed- elevated WBC in 20K, hgb 7.2, bmet pending- nurse calling to inquire if blood is in lab- will need CMP, lipase Abdominal CT scan ordered, also CXR  Labs Reviewed  URINALYSIS, ROUTINE W REFLEX MICROSCOPIC - Abnormal; Notable for the following:    Color, Urine AMBER (*) BIOCHEMICALS MAY BE AFFECTED BY COLOR   All other components within normal limits  COMPREHENSIVE METABOLIC PANEL  LIPASE, BLOOD   Dg Chest 2 View  03/15/2011  *RADIOLOGY REPORT*  Clinical Data: Left lower chest pain and upper abdominal  pain. Sickle cell anemia.  Asthma.  CHEST - 2 VIEW  Comparison: 09/30/2010  Findings: Stable accentuated pulmonary vasculature noted. Cardiomegaly is present.  No new airspace opacity or significant pleural effusion identified. Prior cholecystectomy noted.  No osseous findings of sickle cell disease noted.  IMPRESSION:  1.  Cardiomegaly with accentuated pulmonary vascularity, stable. No acute findings.  Original Report Authenticated By: Dellia Cloud, M.D.    Ct Abdomen Pelvis W Contrast  03/15/2011  *RADIOLOGY REPORT*  Clinical Data: Left upper abdominal pain  CT ABDOMEN AND PELVIS WITH CONTRAST  Technique:  Multidetector CT imaging of the abdomen and pelvis was performed following the standard protocol during bolus administration of intravenous contrast.  Contrast: OMNIPAQUE IOHEXOL 300 MG/ML IV SOLN  Comparison: None.  Findings: Poor technique limits the exam.  Mild pericardial effusion is partially imaged.  Several low density areas are scattered throughout the spleen. Some are round and others are wedge-shaped.  The spleen is enlarged measuring 17.2 cm.  Post cholecystectomy.  Liver, pancreas, adrenal glands, kidneys are within normal limits. No free fluid.  Prostate and bladder are within normal limits.  No abnormal adenopathy.  IMPRESSION: Pericardial effusion.  Splenomegaly with multiple spleen hypodensities.  Considerations include splenic infarcts and infarctions.  Given history of sickle cell disorder, splenic infarcts are favored.  Original Report Authenticated By: Donavan Burnet, M.D.     No diagnosis found.    MDM  Pt with 3 days of left upper quadrant pain that has been constant.  CT scan shows splenic infarcts.  Pt will need admission.  Have provided pain control- calling to discuss results with Dr. Myna Hidalgo as well.       2:36 PM D/w NP working with dr. Myna Hidalgo- she states hospitalist at [facility] to admit patient- and Dr. Myna Hidalgo will see patient in the morning.  Does not recommend transfusion at this time.  Pt updated about findings and plan for transfer D/w [facility] for admission- will write holding orders and arrange for transfer- pt given more medication for pain  Ethelda Chick, MD 03/15/11 1437  Ethelda Chick, MD 03/15/11 [phone]

## 2011-03-15 NOTE — ED Notes (Signed)
This rn called to room by pharmacy tech, states pt friend has brought in food and they are getting ready to eat.  This rn rushes to room, pt informed that he is NPO and not to eat or drink anything. Pt becomes angry and upset, shouts "this is bullshit! I don't want to be admitted!" also pt states he is "going outside to burn one..." pt encouraged to remain in his room, requested that friend take [facility] food out of room, friend refuses. Dr. Karma Ganja alerted, will speak with pt.  Report given to scott, rn.

## 2011-03-16 DIAGNOSIS — D571 Sickle-cell disease without crisis: Secondary | ICD-10-CM

## 2011-03-17 LAB — RETICULOCYTES
RBC.: 2.46 MIL/uL — ABNORMAL LOW (ref 4.22–5.81)
Retic Count, Absolute: 563.3 10*3/uL — ABNORMAL HIGH (ref 19.0–186.0)

## 2011-03-17 LAB — CBC
HCT: 22.9 % — ABNORMAL LOW (ref 39.0–52.0)
Hemoglobin: 8.1 g/dL — ABNORMAL LOW (ref 13.0–17.0)
MCV: 93.1 fL (ref 78.0–100.0)
RBC: 2.46 MIL/uL — ABNORMAL LOW (ref 4.22–5.81)
WBC: 32.3 10*3/uL — ABNORMAL HIGH (ref 4.0–10.5)

## 2011-03-18 LAB — RETICULOCYTES
RBC.: 2.86 MIL/uL — ABNORMAL LOW (ref 4.22–5.81)
Retic Count, Absolute: 437.6 10*3/uL — ABNORMAL HIGH (ref 19.0–186.0)
Retic Ct Pct: 15.3 % — ABNORMAL HIGH (ref 0.4–3.1)

## 2011-03-18 LAB — CBC
HCT: 25.6 % — ABNORMAL LOW (ref 39.0–52.0)
Hemoglobin: 9.3 g/dL — ABNORMAL LOW (ref 13.0–17.0)
WBC: 17.3 10*3/uL — ABNORMAL HIGH (ref 4.0–10.5)

## 2011-03-19 LAB — CROSSMATCH
ABO/RH(D): O POS
Unit division: 0
Unit division: 0
Unit division: 0

## 2011-03-26 NOTE — Discharge Summary (Signed)
NAME:  Christopher Warren, Christopher Warren NO.:  [id number]  MEDICAL RECORD NO.:  [id number]  LOCATION:  1337                         FACILITY:  [facility]  PHYSICIAN:  Pleas Koch, MD        DATE OF BIRTH:  June 21, 1975  DATE OF ADMISSION:  03/15/2011 DATE OF DISCHARGE:  03/18/2011                              DISCHARGE SUMMARY   PRIMARY CARE PHYSICIAN:  Theron Arista R. Myna Hidalgo, MD  DISCHARGE DIAGNOSES: 1. Acute sickle crisis. 2. History of splenic infarct. 3. ? Pain medicine seeking behavior. 4. Asthma. 5. History of heart murmur.  DISCHARGE MEDICATIONS: 1. Hydroxyurea 500 one capsule daily. 2. Deferasirox 500 two tablets daily. 3. Pilocarpine 2 drops daily p.r.n. 4. Ventolin inhaler 2 puffs q.4 p.r.n. 5. Folic acid 1 mg p.o. daily.  Please note, refilled the medications as follows:  Hydromorphone 4 mg 1 tablet q.6 p.r.n., quantity 24 given.  Please see full HPI by Dr. Marguerite Olea, job number [id number].  HISTORY OF PRESENT ILLNESS:  Briefly, this is a Christopher Warren who was admitted initially to the Hospitalist Service with sickle cell disease, asthma, heart murmur, complaint of abdominal pain.  He had no shortness of breath and evaluation there showed multiple splenic infarcts. Baseline hemoglobin is 7.4 and retic count was 2.06 and recommended to be admitted but left the Urgent Care Center and presented to the emergency room.  The patient is well-known to Dr. Arlan Organ, however, inadvertently Dr. August Saucer was peripherally involved in his care as a sickle cell specialist within the [facility].  As such, Dr. Myna Hidalgo managed his care over the 2 days that the patient was here and Dr. August Saucer was not involved in his care given that another hematologist/oncologist was seeing the patient.  I was involved in care to the extent that I transferred service over to Dr. August Saucer on admission to streamline his sickle cell disease care from a specialist standpoint.  HOSPITAL COURSE:  Sickle  crisis.  The patient was seen daily by Dr. Myna Hidalgo and was hydrated well and noted that his hemoglobin subsequent to transfusion of packed blood cells went up to a high point of 9.3.  He had a very appropriate reticulocyte response to 2.86 and was recommended by Dr. Myna Hidalgo to have an exchange transfusion which was performed today, March 18, 2011 and the patient has completed.  The patient was recommended to follow up with Dr. Myna Hidalgo as an outpatient for further management of sickle disease.  The patient did voice request to have a splenectomy in view of his abdominal pain.  This will have to be followed up as an outpatient with Dr. Myna Hidalgo.  The patient was discharged home in limited amount of pain medications and was counseled significantly regarding his multiple medications he takes for pain in addition to a positive drug screen that was noted on admission.  He was positive for cocaine, opiates, and tetrahydrocannabinol.  DISCHARGE VITAL SIGNS:  Temperature 98.7, pulse 74, respirations 18, blood pressure 131-123/36-96% on room air.  FOLLOWUP:  The patient to follow up with Dr. Myna Hidalgo as an outpatient as that his primary care physician.          ______________________________ Pleas Koch, MD  JS/MEDQ  D:  03/18/2011  T:  03/18/2011  Job:  [id number]  cc:   Christopher Warren, M.D.  Electronically Signed by Pleas Koch MD on 03/26/2011 06:54:22 PM

## 2011-03-30 NOTE — H&P (Signed)
NAME:  Christopher Warren, Christopher Warren NO.:  [id number]  MEDICAL RECORD NO.:  [id number]  LOCATION:  WLED                         FACILITY:  [facility]  PHYSICIAN:  Houston Siren, MD           DATE OF BIRTH:  01-06-1976  DATE OF ADMISSION:  03/15/2011 DATE OF DISCHARGE:                             HISTORY & PHYSICAL   PRIMARY CARE PHYSICIAN:  [name], M.D.  ADVANCE DIRECTIVE:  Full code.  REASON FOR ADMISSION:  Anemia, sickle cell crisis, and splenic infarct.  HISTORY OF PRESENT ILLNESS:  This is a 35 year old male with history of sickle cell disease, asthma, heart murmur, presented earlier to the Urgent Care complaining of abdominal pain.  He states his pain is on the left side.  He has no nausea, vomiting, fever, or chills.  He has no joint pain or chest pain.  No shortness of breath.  Evaluation there included an abdominal CT showed multiple splenic infarcts.  He has a hemoglobin of 7.4, baseline unclear.  Creatinine 0.47, potassium of 3.6, blood glucose of 116, reticulocyte count 2.06.  Hospitalist was asked to admit the patient for further evaluation and treatment.  He was recommended to admission, but actually left the Urgent Care and subsequently he presented here.  PAST MEDICAL HISTORY:  As above.  SOCIAL HISTORY:  He does smoke cigarettes and has history of cannabis abuse.  ALLERGY:  MORPHINE causes hives.  MEDICATIONS:  Include folic acid, hydroxyurea, and Dilaudid.  FAMILY HISTORY:  Noncontributory.  REVIEW OF SYSTEMS:  Otherwise, unremarkable.  PHYSICAL EXAMINATION:  VITAL SIGNS:  Blood pressure is 115/52, pulse of 100, respiratory rate of 16, and temperature 99.8. GENERAL:  He is sitting up, not in any respiratory distress. HEENT:  Sclerae are icteric. CARDIAC EXAM:  Revealed S1 and S2 with a 2/6 systolic ejection murmur at the left sternal border. LUNGS:  Clear. ABDOMINAL EXAM:  Scaphoid, slightly tender diffusely. EXTREMITIES:  Show no edema.  No  calf tenderness. SKIN:  Warm and dry.  He has tattoo all over his body.  No tremor.  No diaphoresis.  No evidence of shock.  He is constantly discussing his pain management and constantly asking for pain medication.  He would fall asleep while I was talking to him and arouse asking for pain medication again.  OBJECTIVE FINDINGS:  As above, hemoglobin 7.4.  CT showed multiple splenic infarcts.  Potassium of 3.6, creatinine of 0.47, reticulocyte count 2.06.  IMPRESSION:  This is a 35 year old with sickle cell disease presents likely with crisis and splenic infarct.  We will treat him oxygen, IV fluid, transfusions, and pain medication.  It should be noted that he does exhibit pain seeking behavior.  I will give him Dilaudid 1 to 2 mg IV q.2 hours p.r.n.  As soon as his medications are reconciled, we will continue them along with folic acid.  He requests IV Benadryl and Zofran with his pain medications and it will be given.  We will admit him to [facility].  He is a full code.     Houston Siren, MD     PL/MEDQ  D:  03/16/2011  T:  03/16/2011  Job:  [id number]  Electronically Signed by Houston Siren  on 03/30/2011 12:11:38 AM

## 2011-04-27 ENCOUNTER — Other Ambulatory Visit: Payer: Self-pay | Admitting: *Deleted

## 2011-04-27 NOTE — Telephone Encounter (Signed)
Left message on voicemail stating that Dr Myna Hidalgo said he would not refill his pain medication until he came in for an appt. He missed his appt on 10/30.

## 2011-05-10 ENCOUNTER — Encounter: Payer: Self-pay | Admitting: Hematology & Oncology

## 2011-05-10 ENCOUNTER — Other Ambulatory Visit: Payer: Self-pay | Admitting: Hematology & Oncology

## 2011-05-10 DIAGNOSIS — D571 Sickle-cell disease without crisis: Secondary | ICD-10-CM

## 2011-05-10 HISTORY — DX: Sickle-cell disease without crisis: D57.1

## 2011-05-11 ENCOUNTER — Encounter: Payer: Medicare Other | Admitting: Hematology & Oncology

## 2011-05-11 ENCOUNTER — Other Ambulatory Visit: Payer: Self-pay | Admitting: Hematology & Oncology

## 2011-05-11 ENCOUNTER — Other Ambulatory Visit (HOSPITAL_BASED_OUTPATIENT_CLINIC_OR_DEPARTMENT_OTHER): Payer: Medicaid Other | Admitting: Lab

## 2011-05-11 VITALS — BP 135/67 | HR 87 | Temp 100.0°F | Ht 65.0 in | Wt 126.0 lb

## 2011-05-11 DIAGNOSIS — D571 Sickle-cell disease without crisis: Secondary | ICD-10-CM

## 2011-05-11 DIAGNOSIS — D57219 Sickle-cell/Hb-C disease with crisis, unspecified: Secondary | ICD-10-CM

## 2011-05-11 LAB — CHCC SATELLITE - SMEAR

## 2011-05-11 LAB — RETICULOCYTES (CHCC)
ABS Retic: 144.3 10*3/uL (ref 19.0–186.0)
RBC.: 2.83 MIL/uL — ABNORMAL LOW (ref 4.22–5.81)

## 2011-05-11 LAB — CBC WITH DIFFERENTIAL (CANCER CENTER ONLY)
BASO#: 0.1 10*3/uL (ref 0.0–0.2)
Eosinophils Absolute: 0.2 10*3/uL (ref 0.0–0.5)
HCT: 25.7 % — ABNORMAL LOW (ref 38.7–49.9)
HGB: 9 g/dL — ABNORMAL LOW (ref 13.0–17.1)
LYMPH%: 10.8 % — ABNORMAL LOW (ref 14.0–48.0)
MCV: 94 fL (ref 82–98)
MONO#: 3.1 10*3/uL — ABNORMAL HIGH (ref 0.1–0.9)
NEUT%: 77.8 % (ref 40.0–80.0)
Platelets: 501 10*3/uL — ABNORMAL HIGH (ref 145–400)
RBC: 2.74 10*6/uL — ABNORMAL LOW (ref 4.20–5.70)
WBC: 29.2 10*3/uL — ABNORMAL HIGH (ref 4.0–10.0)

## 2011-05-11 LAB — IRON AND TIBC
Iron: 156 ug/dL (ref 42–165)
TIBC: 253 ug/dL (ref 215–435)

## 2011-05-11 MED ORDER — HYDROMORPHONE HCL 4 MG PO TABS: 4.0000 mg | ORAL_TABLET | ORAL | Status: DC | PRN

## 2011-05-11 MED ORDER — HYDROXYUREA 500 MG PO CAPS: 500.0000 mg | ORAL_CAPSULE | Freq: Three times a day (TID) | ORAL | Status: DC

## 2011-05-11 MED ORDER — CEFDINIR 300 MG PO CAPS: 300.0000 mg | ORAL_CAPSULE | Freq: Two times a day (BID) | ORAL | Status: AC

## 2011-05-30 ENCOUNTER — Telehealth: Payer: Self-pay | Admitting: *Deleted

## 2011-05-30 NOTE — Telephone Encounter (Signed)
Pt called made 12-21 appointment

## 2011-06-03 ENCOUNTER — Other Ambulatory Visit: Payer: Self-pay | Admitting: Hematology & Oncology

## 2011-06-03 ENCOUNTER — Other Ambulatory Visit (HOSPITAL_BASED_OUTPATIENT_CLINIC_OR_DEPARTMENT_OTHER): Payer: Medicaid Other | Admitting: Lab

## 2011-06-03 ENCOUNTER — Ambulatory Visit (HOSPITAL_BASED_OUTPATIENT_CLINIC_OR_DEPARTMENT_OTHER): Payer: Medicaid Other | Admitting: Hematology & Oncology

## 2011-06-03 VITALS — BP 120/63 | HR 102 | Temp 98.7°F | Ht 65.0 in | Wt 131.0 lb

## 2011-06-03 DIAGNOSIS — D571 Sickle-cell disease without crisis: Secondary | ICD-10-CM

## 2011-06-03 LAB — CBC WITH DIFFERENTIAL (CANCER CENTER ONLY)
BASO#: 0.1 10*3/uL (ref 0.0–0.2)
EOS%: 4.7 % (ref 0.0–7.0)
HGB: 7.2 g/dL — ABNORMAL LOW (ref 13.0–17.1)
MCH: 34.4 pg — ABNORMAL HIGH (ref 28.0–33.4)
MCHC: 35 g/dL (ref 32.0–35.9)
MONO%: 10.7 % (ref 0.0–13.0)
NEUT#: 16.9 10*3/uL — ABNORMAL HIGH (ref 1.5–6.5)
NEUT%: 66.5 % (ref 40.0–80.0)

## 2011-06-03 LAB — BASIC METABOLIC PANEL
CO2: 22 mEq/L (ref 19–32)
Calcium: 9 mg/dL (ref 8.4–10.5)
Creatinine, Ser: 0.71 mg/dL (ref 0.50–1.35)
Glucose, Bld: 108 mg/dL — ABNORMAL HIGH (ref 70–99)

## 2011-06-03 LAB — RETICULOCYTES (CHCC): Retic Ct Pct: >20 % — ABNORMAL HIGH (ref 0.4–2.3)

## 2011-06-03 LAB — HOLD TUBE, BLOOD BANK - CHCC SATELLITE

## 2011-06-03 MED ORDER — HYDROMORPHONE HCL 4 MG PO TABS: 4.0000 mg | ORAL_TABLET | ORAL | Status: DC | PRN

## 2011-06-03 NOTE — Progress Notes (Signed)
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  CURRENT THERAPY: 1. Hydrea 1500 mg p.o. daily. Exjade daily.  INTERIM HISTORY:  [name] comes in for followup.  He seems to be doing pretty well.  Again, it is hard to really know if he is taking his medication or not.  He is not complaining of any kind of pain.  He was hospitalized, I think back in October, because of a splenic infarct.  Again, he said he is taking the Exjade.  It is just hard to tell that if that truly is the case or not.  His last ferritin back on 05/11/2011 was 4241.  His iron saturation was 62%.  He still has his "carefree" lifestyle.  He is with his new girlfriend.  He has never let us do any exchange transfusion on him.  He has never wanted to have central line put in.  He has a "phobia" about these. This also is hard to understand since he has multiple tattoos on his body.  He may consent to a PICC line.  However, this would be somewhat risky given his lifestyle.  PHYSICAL EXAM:  General: This is a well-developed white gentleman in no obvious distress.  Vital signs: 98.7, pulse 102, respiratory 20, blood pressure 120/63, and weight is 131.  Head and neck exam shows a normocephalic, atraumatic skull.  There are no ocular or oral lesions. There are no palpable cervical or supraclavicular lymph nodes.  Lungs are clear to percussion and auscultation bilaterally.  Cardiac examination:  Regular rhythm with a normal S1 and S2.  There are no murmurs, rubs, or bruits.  Abdominal exam: Soft with good bowel sounds. There is no fluid wave.  There is no palpable abdominal mass.  He has no tenderness in the left upper quadrant.  There is no splenomegaly or hepatomegaly.  Extremities: Shows no clubbing, cyanosis, or edema. Neurological exam: Shows no focal neurological deficits.  LABORATORY STUDIES:  White cell count 25, hemoglobin 7.2, hematocrit 20.6, and platelet count 319.  IMPRESSION:  [name]  is a 35 year old African American gentleman with hemoglobin SS disease.  Again, he "does his own thing."  He really has not followed the guidelines that we essentially set for him.  He understands the risks that he takes, by not doing what feel is appropriate.  He fully is cognizant of the risk that he does take.  He is going to need to be transfused again.  He will get this next week.  We refilled his pain medication again today.  He will let us know when he wants to be seen again.  Again, I sincerely doubt he is taking the Exjade.  Unfortunately, there is really no way to prove this.    ______________________________ [name], M.D. PRE/MEDQ  D:  06/03/2011  T:  06/03/2011  Job:  [id number]

## 2011-06-03 NOTE — Progress Notes (Signed)
This office note has been dictated.

## 2011-06-06 ENCOUNTER — Ambulatory Visit: Payer: Medicare Other | Admitting: Lab

## 2011-06-06 ENCOUNTER — Encounter (HOSPITAL_COMMUNITY)
Admission: RE | Admit: 2011-06-06 | Discharge: 2011-06-06 | Disposition: A | Discharge status: Home / Self Care | Payer: Medicare Other | Source: Ambulatory Visit | Attending: Hematology & Oncology | Admitting: Hematology & Oncology

## 2011-06-06 DIAGNOSIS — D571 Sickle-cell disease without crisis: Secondary | ICD-10-CM

## 2011-06-06 LAB — PREPARE RBC (CROSSMATCH)

## 2011-06-08 ENCOUNTER — Ambulatory Visit (HOSPITAL_BASED_OUTPATIENT_CLINIC_OR_DEPARTMENT_OTHER): Payer: Medicaid Other

## 2011-06-08 VITALS — BP 122/76 | HR 88 | Temp 97.9°F | Resp 20

## 2011-06-08 DIAGNOSIS — D571 Sickle-cell disease without crisis: Secondary | ICD-10-CM

## 2011-06-08 MED ORDER — DIPHENHYDRAMINE HCL 50 MG/ML IJ SOLN: 25.0000 mg | Freq: Once | INTRAMUSCULAR | Status: DC

## 2011-06-08 MED ORDER — DIPHENHYDRAMINE HCL 25 MG PO TABS
25.0000 mg | ORAL_TABLET | Freq: Once | ORAL | Status: AC
  Administered 2011-06-08: 25 mg via ORAL
  Filled 2011-06-08: qty 1

## 2011-06-08 MED ORDER — FUROSEMIDE 10 MG/ML IJ SOLN
20.0000 mg | Freq: Once | INTRAMUSCULAR | Status: AC
  Administered 2011-06-08: 20 mg via INTRAVENOUS

## 2011-06-08 MED ORDER — HYDROMORPHONE HCL 4 MG PO TABS: 4.0000 mg | ORAL_TABLET | ORAL | Status: AC | PRN

## 2011-06-08 MED ORDER — ACETAMINOPHEN 325 MG PO TABS
650.0000 mg | ORAL_TABLET | Freq: Once | ORAL | Status: AC
  Administered 2011-06-08: 650 mg via ORAL

## 2011-06-08 MED ORDER — HYDROMORPHONE HCL PF 4 MG/ML IJ SOLN
4.0000 mg | Freq: Once | INTRAMUSCULAR | Status: AC
  Administered 2011-06-08: 4 mg via INTRAVENOUS

## 2011-06-09 LAB — TYPE AND SCREEN
ABO/RH(D): O POS
Antibody Screen: NEGATIVE
Unit division: 0
Unit division: 0

## 2011-07-06 ENCOUNTER — Telehealth: Payer: Self-pay | Admitting: Hematology & Oncology

## 2011-07-06 NOTE — Telephone Encounter (Signed)
Pt called said needed lab and med refill. Per MD see in a couple of weeks, Pt says he needs to be seen Friday and would not leave message on RN voice mail because he was on the other line and wants me to give RN message. RN is aware.

## 2011-07-06 NOTE — Telephone Encounter (Signed)
Per RN pt aware of 07-08-11 appointment

## 2011-07-07 ENCOUNTER — Other Ambulatory Visit: Payer: Self-pay | Admitting: Hematology & Oncology

## 2011-07-07 DIAGNOSIS — D571 Sickle-cell disease without crisis: Secondary | ICD-10-CM

## 2011-07-08 ENCOUNTER — Other Ambulatory Visit (HOSPITAL_BASED_OUTPATIENT_CLINIC_OR_DEPARTMENT_OTHER): Payer: Medicare Other | Admitting: Lab

## 2011-07-08 ENCOUNTER — Other Ambulatory Visit: Payer: Self-pay | Admitting: *Deleted

## 2011-07-08 DIAGNOSIS — D571 Sickle-cell disease without crisis: Secondary | ICD-10-CM

## 2011-07-08 LAB — CBC WITH DIFFERENTIAL (CANCER CENTER ONLY)
Eosinophils Absolute: 1 10*3/uL — ABNORMAL HIGH (ref 0.0–0.5)
HCT: 20.1 % — ABNORMAL LOW (ref 38.7–49.9)
LYMPH%: 23.8 % (ref 14.0–48.0)
MCH: 34 pg — ABNORMAL HIGH (ref 28.0–33.4)
MCV: 96 fL (ref 82–98)
MONO%: 10.7 % (ref 0.0–13.0)
NEUT%: 60.5 % (ref 40.0–80.0)
Platelets: 362 10*3/uL (ref 145–400)
RBC: 2.09 10*6/uL — ABNORMAL LOW (ref 4.20–5.70)
RDW: 23.2 % — ABNORMAL HIGH (ref 11.1–15.7)

## 2011-07-08 LAB — TECHNOLOGIST REVIEW CHCC SATELLITE

## 2011-07-08 MED ORDER — HYDROMORPHONE HCL 4 MG PO TABS: 4.0000 mg | ORAL_TABLET | ORAL | Status: DC | PRN

## 2011-07-08 NOTE — Telephone Encounter (Signed)
When the pt arrived to pick up his rx he was upset that he was unable to obtain more than 30 tablets due to his transportation issues. He wanted to know if Dr Myna Hidalgo could be called to tell Dr Arbutus Ped that it was ok to give him more than 30 pills. Explained to him that when another physician is covering it is up to them to determine what they are comfortable with giving. He further stated "I just don't get this just get him to call Dr Myna Hidalgo". Explained to him that Dr. Myna Hidalgo was off and the covering physician is only required to provide enough to get him through until Dr Myna Hidalgo returns. He continued to demand to have Dr Myna Hidalgo called and he was told for the third time that it was his day off. [name] then said "well then I'm just going to have to go to the ER". Asked him how many pills he had left and he stated "a couple". He took the prescriptions with him when he left but said several times that he probably wasn't going to have it filled.  Will discuss with Dr Myna Hidalgo on Monday what other options he may have.  Of note: Dr Myna Hidalgo called earlier in the morning and stated that if Mr Christopher Warren & H was around the same as last time, no transfusion was needed. Reviewed the labs with [name], no interventions to be done at this time based on labs per Dr Myna Hidalgo.

## 2011-07-08 NOTE — Telephone Encounter (Signed)
Pt came in to have labs checked and needs to have Dilaudid refilled. Dr Arbutus Ped is covering for Dr Myna Hidalgo who is willing to give him 30 tablets.

## 2011-07-09 LAB — IRON AND TIBC
%SAT: 91 % — ABNORMAL HIGH (ref 20–55)
Iron: 173 ug/dL — ABNORMAL HIGH (ref 42–165)

## 2011-07-09 LAB — RETICULOCYTES (CHCC): RBC.: 2.14 MIL/uL — ABNORMAL LOW (ref 4.22–5.81)

## 2011-07-12 ENCOUNTER — Other Ambulatory Visit: Payer: Self-pay | Admitting: Hematology & Oncology

## 2011-07-12 DIAGNOSIS — D571 Sickle-cell disease without crisis: Secondary | ICD-10-CM

## 2011-07-12 MED ORDER — HYDROMORPHONE HCL 4 MG PO TABS: 4.0000 mg | ORAL_TABLET | Freq: Four times a day (QID) | ORAL | Status: DC | PRN

## 2011-08-16 ENCOUNTER — Encounter (HOSPITAL_COMMUNITY)
Admission: RE | Admit: 2011-08-16 | Discharge: 2011-08-16 | Disposition: A | Discharge status: Home / Self Care | Payer: Medicaid Other | Source: Ambulatory Visit | Attending: Hematology & Oncology | Admitting: Hematology & Oncology

## 2011-08-16 DIAGNOSIS — D5700 Hb-SS disease with crisis, unspecified: Secondary | ICD-10-CM

## 2011-08-16 DIAGNOSIS — D571 Sickle-cell disease without crisis: Secondary | ICD-10-CM

## 2011-08-22 ENCOUNTER — Telehealth: Payer: Self-pay | Admitting: Hematology & Oncology

## 2011-08-22 NOTE — Telephone Encounter (Signed)
Faxed last 2 office visit notes to [facility] @ [facility] per phoned request.

## 2011-08-25 ENCOUNTER — Other Ambulatory Visit: Payer: Self-pay | Admitting: Hematology & Oncology

## 2011-08-25 ENCOUNTER — Ambulatory Visit (HOSPITAL_BASED_OUTPATIENT_CLINIC_OR_DEPARTMENT_OTHER): Payer: Medicaid Other

## 2011-08-25 ENCOUNTER — Other Ambulatory Visit: Payer: Self-pay | Admitting: *Deleted

## 2011-08-25 ENCOUNTER — Telehealth: Payer: Self-pay | Admitting: *Deleted

## 2011-08-25 ENCOUNTER — Other Ambulatory Visit (HOSPITAL_BASED_OUTPATIENT_CLINIC_OR_DEPARTMENT_OTHER): Payer: Medicaid Other | Admitting: Lab

## 2011-08-25 VITALS — BP 118/64 | HR 108 | Temp 100.4°F

## 2011-08-25 DIAGNOSIS — M549 Dorsalgia, unspecified: Secondary | ICD-10-CM

## 2011-08-25 DIAGNOSIS — D571 Sickle-cell disease without crisis: Secondary | ICD-10-CM

## 2011-08-25 DIAGNOSIS — D5700 Hb-SS disease with crisis, unspecified: Secondary | ICD-10-CM

## 2011-08-25 LAB — CBC WITH DIFFERENTIAL (CANCER CENTER ONLY)
BASO%: 0.5 % (ref 0.0–2.0)
EOS%: 2.1 % (ref 0.0–7.0)
HCT: 18.4 % — ABNORMAL LOW (ref 38.7–49.9)
LYMPH#: 3.1 10*3/uL (ref 0.9–3.3)
MCHC: 34.2 g/dL (ref 32.0–35.9)
MONO#: 2.6 10*3/uL — ABNORMAL HIGH (ref 0.1–0.9)
NEUT#: 18.5 10*3/uL — ABNORMAL HIGH (ref 1.5–6.5)
NEUT%: 74.6 % (ref 40.0–80.0)
Platelets: 304 10*3/uL (ref 145–400)
RDW: 18.7 % — ABNORMAL HIGH (ref 11.1–15.7)
WBC: 24.8 10*3/uL — ABNORMAL HIGH (ref 4.0–10.0)

## 2011-08-25 LAB — HOLD TUBE, BLOOD BANK - CHCC SATELLITE

## 2011-08-25 MED ORDER — HYDROMORPHONE HCL PF 4 MG/ML IJ SOLN
4.0000 mg | Freq: Once | INTRAMUSCULAR | Status: AC
  Administered 2011-08-25: 4 mg via INTRAVENOUS

## 2011-08-25 MED ORDER — DIPHENHYDRAMINE HCL 50 MG/ML IJ SOLN
12.5000 mg | Freq: Once | INTRAMUSCULAR | Status: AC
  Administered 2011-08-25: 12.5 mg via INTRAVENOUS

## 2011-08-25 MED ORDER — SODIUM CHLORIDE 0.9 % IJ SOLN
10.0000 mL | INTRAMUSCULAR | Status: DC | PRN
  Administered 2011-08-25: 10 mL via INTRAVENOUS
  Filled 2011-08-25: qty 10

## 2011-08-25 MED ORDER — LIDOCAINE-PRILOCAINE 2.5-2.5 % EX CREA
TOPICAL_CREAM | Freq: Once | CUTANEOUS | Status: AC
  Administered 2011-08-25: 14:00:00 via TOPICAL

## 2011-08-25 MED ORDER — HEPARIN SOD (PORK) LOCK FLUSH 100 UNIT/ML IV SOLN
500.0000 [IU] | Freq: Once | INTRAVENOUS | Status: AC
  Administered 2011-08-25: 500 [IU] via INTRAVENOUS
  Filled 2011-08-25: qty 5

## 2011-08-25 MED ORDER — SODIUM CHLORIDE 0.9 % IV SOLN
Freq: Once | INTRAVENOUS | Status: AC
  Administered 2011-08-25: 15:00:00 via INTRAVENOUS

## 2011-08-25 NOTE — Progress Notes (Signed)
Addended by: Lacie Draft on: 08/25/2011 06:02 PM   Modules accepted: Orders

## 2011-08-25 NOTE — Telephone Encounter (Signed)
Patient called stating he just got out of the hospital on Tuesday and feels as if he is having a crisis.  Having a great deal of pain.  Spoke to Dr. Lupita Leash wants to bring patient in for pain meds and fluids,.

## 2011-08-26 ENCOUNTER — Ambulatory Visit (HOSPITAL_BASED_OUTPATIENT_CLINIC_OR_DEPARTMENT_OTHER): Payer: Medicaid Other

## 2011-08-26 ENCOUNTER — Other Ambulatory Visit: Payer: Self-pay | Admitting: *Deleted

## 2011-08-26 VITALS — BP 108/73 | HR 77 | Temp 98.8°F | Resp 16

## 2011-08-26 DIAGNOSIS — D571 Sickle-cell disease without crisis: Secondary | ICD-10-CM

## 2011-08-26 DIAGNOSIS — R52 Pain, unspecified: Secondary | ICD-10-CM

## 2011-08-26 DIAGNOSIS — D5700 Hb-SS disease with crisis, unspecified: Secondary | ICD-10-CM

## 2011-08-26 MED ORDER — HYDROMORPHONE HCL PF 4 MG/ML IJ SOLN
4.0000 mg | Freq: Once | INTRAMUSCULAR | Status: AC
  Administered 2011-08-26: 4 mg via INTRAVENOUS

## 2011-08-26 MED ORDER — FUROSEMIDE 10 MG/ML IJ SOLN
20.0000 mg | Freq: Once | INTRAMUSCULAR | Status: AC
  Administered 2011-08-26: 20 mg via INTRAMUSCULAR

## 2011-08-26 MED ORDER — HEPARIN SOD (PORK) LOCK FLUSH 100 UNIT/ML IV SOLN
500.0000 [IU] | INTRAVENOUS | Status: AC | PRN
  Administered 2011-08-26: 500 [IU]
  Filled 2011-08-26: qty 5

## 2011-08-26 MED ORDER — SODIUM CHLORIDE 0.9 % IV SOLN
250.0000 mL | Freq: Once | INTRAVENOUS | Status: AC
  Administered 2011-08-26: 250 mL via INTRAVENOUS

## 2011-08-26 MED ORDER — WARFARIN SODIUM 1 MG PO TABS: ORAL_TABLET | ORAL | Status: DC

## 2011-08-26 MED ORDER — SODIUM CHLORIDE 0.9 % IJ SOLN
10.0000 mL | INTRAMUSCULAR | Status: AC | PRN
  Administered 2011-08-26: 10 mL
  Filled 2011-08-26: qty 10

## 2011-08-26 MED ORDER — PROMETHAZINE HCL 25 MG/ML IJ SOLN
25.0000 mg | INTRAMUSCULAR | Status: DC | PRN
  Administered 2011-08-26: 25 mg via INTRAVENOUS

## 2011-08-26 MED ORDER — ACETAMINOPHEN 325 MG PO TABS
650.0000 mg | ORAL_TABLET | Freq: Once | ORAL | Status: AC
  Administered 2011-08-26: 650 mg via ORAL

## 2011-08-26 MED ORDER — DIPHENHYDRAMINE HCL 25 MG PO CAPS
25.0000 mg | ORAL_CAPSULE | Freq: Once | ORAL | Status: AC
  Administered 2011-08-26: 25 mg via ORAL

## 2011-08-26 NOTE — Telephone Encounter (Signed)
Pt placed on Coumadin 1 mg daily per Dr Myna Hidalgo for port maintenance. He and his mother verbalized understanding the reason for use and to call if s/s of bleeding. Encouraged to hold extra pressure with any cuts, scrapes or needle sticks.

## 2011-08-26 NOTE — Patient Instructions (Signed)
Warfarin  Warfarin is a blood thinner (anticoagulant) medicine. It is used to keep clots from forming in your blood. When you take warfarin, you may bruise easily. You may also bleed a little longer if you cut yourself.  Before taking warfarin, tell your doctor if:  You take any other medicine for your heart or blood pressure.   You are pregnant.   You plan to get pregnant.   You are breastfeeding.   You are allergic to any medicine.  HOME CARE  Take warfarin as told by your doctor. Do not stop the medicine unless your doctor tells you to.   Take your medicine at the same time every day.   Do not take anything that has aspirin in it unless your doctor says it is okay.   Do not drink alcohol.   Tell all your doctors and dentists that you are taking warfarin before they treat you or give you any medicines.   Keep all your appointments for doctor visits and blood tests.   Keep warfarin out of reach of children. Do not share warfarin with anyone.   Eat about the same amount of vitamin K foods every day.   High vitamin K foods: Beef liver. Pork liver. Green tea. Broccoli. Brussels sprouts. Cauliflower. Chickpeas. Kale. Spinach. Turnip greens.   Medium vitamin K foods: Chicken liver. Pork tenderloin. Cheddar cheese. Rolled oats. Coffee. Asparagus. Cabbage. Iceberg lettuce.   Low vitamin K foods: Apples. Butter. Bananas. Skim or 1% milk. Orange rice. Canned pears. White bread. Strawberries. Corn. Tomatoes. Green beans. Eggs. Potatoes. Tomasa Blase. Pumpkin. Chicken breasts. Ground beef. Oil (except soybean oil).  GET HELP RIGHT AWAY IF:  You miss a dose of warfarin. Do not take 2 doses at the same time.   You have a skin rash.   You have heavy or unusual bleeding.   There is blood in your pee (urine) or poop (stool).   You have side effects from medicine that do not get better after a few days.  MAKE SURE YOU:  Understand these instructions.   Will watch your condition.   Will  get help right away if you are not doing well or get worse.  Document Released: 07/02/2010 Document Revised: 05/19/2011 Document Reviewed: 07/02/2010 [facility] Patient Information 2012 [place], [place].  Blood Transfusion  A blood transfusion replaces your blood or some of its parts. Blood is replaced when you have lost blood because of surgery, an accident, or for severe blood conditions like anemia. You can donate blood to be used on yourself if you have a planned surgery. If you lose blood during that surgery, your own blood can be given back to you. Any blood given to you is checked to make sure it matches your blood type. Your temperature, blood pressure, and heart rate (vital signs) will be checked often.  GET HELP RIGHT AWAY IF:   You feel sick to your stomach (nauseous) or throw up (vomit).   You have watery poop (diarrhea).   You have shortness of breath or trouble breathing.   You have blood in your pee (urine) or have dark colored pee.   You have chest pain or tightness.   Your eyes or skin turn yellow (jaundice).   You have a temperature by mouth above 102 F (38.9 C), not controlled by medicine.   You start to shake and have chills.   You develop a a red rash (hives) or feel itchy.   You develop lightheadedness or feel confused.  You develop back, joint, or muscle pain.   You do not feel hungry (lost appetite).   You feel tired, restless, or nervous.   You develop belly (abdominal) cramps.  Document Released: 08/26/2008 Document Revised: 05/19/2011 Document Reviewed: 08/26/2008 [facility] Patient Information 2012 [place], [place].

## 2011-08-26 NOTE — Progress Notes (Signed)
Christopher Warren presents today for phlebotomy per MD orders. Phlebotomy procedure started at 0930 and ended at 1000. 500 grams removed. Patient observed for 30 minutes after procedure without any incident. Patient tolerated procedure well. IV needle removed intact.  [name] will be getting blood transfusion now. Will continue to monitor.

## 2011-08-27 LAB — TYPE AND SCREEN
ABO/RH(D): O POS
Antibody Screen: NEGATIVE
Unit division: 0
Unit division: 0

## 2011-08-29 ENCOUNTER — Other Ambulatory Visit: Payer: Medicare Other | Admitting: Lab

## 2011-08-29 ENCOUNTER — Ambulatory Visit (HOSPITAL_BASED_OUTPATIENT_CLINIC_OR_DEPARTMENT_OTHER): Payer: Medicaid Other

## 2011-08-29 ENCOUNTER — Other Ambulatory Visit: Payer: Self-pay | Admitting: *Deleted

## 2011-08-29 VITALS — BP 106/51 | HR 70 | Temp 98.1°F | Resp 16

## 2011-08-29 DIAGNOSIS — D571 Sickle-cell disease without crisis: Secondary | ICD-10-CM

## 2011-08-29 DIAGNOSIS — D5700 Hb-SS disease with crisis, unspecified: Secondary | ICD-10-CM

## 2011-08-29 MED ORDER — SODIUM CHLORIDE 0.9 % IV SOLN: 250.0000 mL | Freq: Once | INTRAVENOUS | Status: DC

## 2011-08-29 MED ORDER — HYDROMORPHONE HCL PF 4 MG/ML IJ SOLN
4.0000 mg | Freq: Once | INTRAMUSCULAR | Status: AC
  Administered 2011-08-29: 4 mg via INTRAVENOUS

## 2011-08-29 MED ORDER — HYDROMORPHONE HCL PF 1 MG/ML IJ SOLN
2.0000 mg | Freq: Once | INTRAMUSCULAR | Status: AC
  Administered 2011-08-29: 2 mg via INTRAVENOUS
  Filled 2011-08-29: qty 2

## 2011-08-29 MED ORDER — HEPARIN SOD (PORK) LOCK FLUSH 100 UNIT/ML IV SOLN
500.0000 [IU] | INTRAVENOUS | Status: DC | PRN
  Filled 2011-08-29: qty 5

## 2011-08-29 MED ORDER — ACETAMINOPHEN 325 MG PO TABS
650.0000 mg | ORAL_TABLET | Freq: Once | ORAL | Status: AC
  Administered 2011-08-29: 650 mg via ORAL

## 2011-08-29 MED ORDER — DIPHENHYDRAMINE HCL 50 MG/ML IJ SOLN: 25.0000 mg | Freq: Once | INTRAMUSCULAR | Status: DC

## 2011-08-29 MED ORDER — DIPHENHYDRAMINE HCL 25 MG PO CAPS
25.0000 mg | ORAL_CAPSULE | Freq: Once | ORAL | Status: AC
  Administered 2011-08-29: 25 mg via ORAL

## 2011-08-29 MED ORDER — SODIUM CHLORIDE 0.9 % IJ SOLN
10.0000 mL | INTRAMUSCULAR | Status: DC | PRN
  Filled 2011-08-29: qty 10

## 2011-08-29 NOTE — Progress Notes (Signed)
Pt c/o back pain rated 8 out of 10. Reviewed with Dr Myna Hidalgo. To give Dilaudid 4 mg IV x 1 now.

## 2011-08-29 NOTE — Progress Notes (Signed)
Christopher Warren presents today for phlebotomy per MD orders. Phlebotomy procedure started at 0930 and ended at 0950. 500 grams removed. Patient observed for 30 minutes after procedure without any incident. Patient tolerated procedure well. IV needle removed intact. See vital under vital tabs.

## 2011-08-30 ENCOUNTER — Ambulatory Visit (HOSPITAL_BASED_OUTPATIENT_CLINIC_OR_DEPARTMENT_OTHER): Payer: Medicaid Other

## 2011-08-30 ENCOUNTER — Other Ambulatory Visit: Payer: Self-pay | Admitting: *Deleted

## 2011-08-30 VITALS — BP 122/70 | HR 100 | Temp 98.3°F | Resp 16

## 2011-08-30 DIAGNOSIS — D571 Sickle-cell disease without crisis: Secondary | ICD-10-CM

## 2011-08-30 DIAGNOSIS — D5700 Hb-SS disease with crisis, unspecified: Secondary | ICD-10-CM

## 2011-08-30 MED ORDER — HEPARIN SOD (PORK) LOCK FLUSH 100 UNIT/ML IV SOLN
250.0000 [IU] | INTRAVENOUS | Status: DC | PRN
  Filled 2011-08-30: qty 5

## 2011-08-30 MED ORDER — SODIUM CHLORIDE 0.9 % IJ SOLN
3.0000 mL | INTRAMUSCULAR | Status: AC | PRN
  Administered 2011-08-30: 3 mL
  Filled 2011-08-30: qty 10

## 2011-08-30 MED ORDER — HYDROMORPHONE HCL PF 4 MG/ML IJ SOLN
4.0000 mg | Freq: Once | INTRAMUSCULAR | Status: AC
  Administered 2011-08-30: 4 mg via INTRAVENOUS

## 2011-08-30 MED ORDER — SODIUM CHLORIDE 0.9 % IV SOLN
250.0000 mL | Freq: Once | INTRAVENOUS | Status: AC
  Administered 2011-08-30: 250 mL via INTRAVENOUS

## 2011-08-30 MED ORDER — ACETAMINOPHEN 325 MG PO TABS
650.0000 mg | ORAL_TABLET | Freq: Once | ORAL | Status: AC
  Administered 2011-08-30: 650 mg via ORAL

## 2011-08-30 MED ORDER — HEPARIN SOD (PORK) LOCK FLUSH 100 UNIT/ML IV SOLN
500.0000 [IU] | INTRAVENOUS | Status: AC | PRN
  Administered 2011-08-30: 500 [IU]
  Filled 2011-08-30: qty 5

## 2011-08-30 MED ORDER — DIPHENHYDRAMINE HCL 25 MG PO CAPS
25.0000 mg | ORAL_CAPSULE | Freq: Once | ORAL | Status: AC
  Administered 2011-08-30: 25 mg via ORAL

## 2011-08-30 NOTE — Progress Notes (Signed)
Christopher Warren presents today for phlebotomy per MD orders. Phlebotomy procedure started at 1000 and ended at 1030 400 mls removed. Today is an exchange transfusion, one unit PRBCs to be infused. [name], Jermel Artley [employer]

## 2011-08-30 NOTE — Patient Instructions (Signed)
Blood Transfusion Information  WHAT IS A BLOOD TRANSFUSION?  A transfusion is the replacement of blood or some of its parts. Blood is made up of multiple cells which provide different functions.   Red blood cells carry oxygen and are used for blood loss replacement.   White blood cells fight against infection.   Platelets control bleeding.   Plasma helps clot blood.   Other blood products are available for specialized needs, such as hemophilia or other clotting disorders.  BEFORE THE TRANSFUSION   Who gives blood for transfusions?    You may be able to donate blood to be used at a later date on yourself (autologous donation).   Relatives can be asked to donate blood. This is generally not any safer than if you have received blood from a stranger. The same precautions are taken to ensure safety when a relative's blood is donated.   Healthy volunteers who are fully evaluated to make sure their blood is safe. This is blood bank blood.  Transfusion therapy is the safest it has ever been in the practice of medicine. Before blood is taken from a donor, a complete history is taken to make sure that person has no history of diseases nor engages in risky social behavior (examples are intravenous drug use or sexual activity with multiple partners). The donor's travel history is screened to minimize risk of transmitting infections, such as malaria. The donated blood is tested for signs of infectious diseases, such as HIV and hepatitis. The blood is then tested to be sure it is compatible with you in order to minimize the chance of a transfusion reaction. If you or a relative donates blood, this is often done in anticipation of surgery and is not appropriate for emergency situations. It takes many days to process the donated blood.  RISKS AND COMPLICATIONS  Although transfusion therapy is very safe and saves many lives, the main dangers of transfusion include:    Getting an infectious disease.   Developing a  transfusion reaction. This is an allergic reaction to something in the blood you were given. Every precaution is taken to prevent this.  The decision to have a blood transfusion has been considered carefully by your caregiver before blood is given. Blood is not given unless the benefits outweigh the risks.  AFTER THE TRANSFUSION   Right after receiving a blood transfusion, you will usually feel much better and more energetic. This is especially true if your red blood cells have gotten low (anemic). The transfusion raises the level of the red blood cells which carry oxygen, and this usually causes an energy increase.   The nurse administering the transfusion will monitor you carefully for complications.  HOME CARE INSTRUCTIONS   No special instructions are needed after a transfusion. You may find your energy is better. Speak with your caregiver about any limitations on activity for underlying diseases you may have.  SEEK MEDICAL CARE IF:    Your condition is not improving after your transfusion.   You develop redness or irritation at the intravenous (IV) site.  SEEK IMMEDIATE MEDICAL CARE IF:   Any of the following symptoms occur over the next 12 hours:   Shaking chills.   You have a temperature by mouth above 102 F (38.9 C), not controlled by medicine.   Chest, back, or muscle pain.   People around you feel you are not acting correctly or are confused.   Shortness of breath or difficulty breathing.   Dizziness and fainting.     You get a rash or develop hives.   You have a decrease in urine output.   Your urine turns a dark color or changes to pink, red, or brown.  Any of the following symptoms occur over the next 10 days:   You have a temperature by mouth above 102 F (38.9 C), not controlled by medicine.   Shortness of breath.   Weakness after normal activity.   The white part of the eye turns yellow (jaundice).   You have a decrease in the amount of urine or are urinating less often.   Your  urine turns a dark color or changes to pink, red, or brown.  Document Released: 05/27/2000 Document Revised: 05/19/2011 Document Reviewed: 01/14/2008  Exit[employer] Patient Information 2012 [employer].

## 2011-08-31 ENCOUNTER — Other Ambulatory Visit: Payer: Self-pay | Admitting: *Deleted

## 2011-08-31 ENCOUNTER — Other Ambulatory Visit (HOSPITAL_BASED_OUTPATIENT_CLINIC_OR_DEPARTMENT_OTHER): Payer: Medicaid Other | Admitting: Lab

## 2011-08-31 ENCOUNTER — Ambulatory Visit (HOSPITAL_BASED_OUTPATIENT_CLINIC_OR_DEPARTMENT_OTHER): Payer: Medicaid Other

## 2011-08-31 VITALS — BP 107/52 | HR 82 | Temp 98.8°F | Resp 20

## 2011-08-31 DIAGNOSIS — D571 Sickle-cell disease without crisis: Secondary | ICD-10-CM

## 2011-08-31 LAB — CBC WITH DIFFERENTIAL (CANCER CENTER ONLY)
BASO%: 0.3 % (ref 0.0–2.0)
LYMPH%: 15.2 % (ref 14.0–48.0)
MCH: 32.2 pg (ref 28.0–33.4)
MCV: 92 fL (ref 82–98)
MONO%: 11.5 % (ref 0.0–13.0)
NEUT%: 69.4 % (ref 40.0–80.0)
Platelets: 302 10*3/uL (ref 145–400)
RDW: 17.3 % — ABNORMAL HIGH (ref 11.1–15.7)

## 2011-08-31 LAB — HOLD TUBE, BLOOD BANK - CHCC SATELLITE

## 2011-08-31 MED ORDER — HEPARIN SOD (PORK) LOCK FLUSH 100 UNIT/ML IV SOLN
500.0000 [IU] | INTRAVENOUS | Status: AC | PRN
  Administered 2011-08-31: 500 [IU]
  Filled 2011-08-31: qty 5

## 2011-08-31 MED ORDER — ACETAMINOPHEN 325 MG PO TABS
650.0000 mg | ORAL_TABLET | Freq: Once | ORAL | Status: AC
  Administered 2011-08-31: 650 mg via ORAL

## 2011-08-31 MED ORDER — PROMETHAZINE HCL 25 MG/ML IJ SOLN
12.5000 mg | Freq: Once | INTRAMUSCULAR | Status: AC
  Administered 2011-08-31: 12.5 mg via INTRAVENOUS

## 2011-08-31 MED ORDER — PREDNISOLONE SODIUM PHOSPHATE 1 % OP SOLN: 1.0000 [drp] | Freq: Two times a day (BID) | OPHTHALMIC | Status: DC

## 2011-08-31 MED ORDER — SODIUM CHLORIDE 0.9 % IJ SOLN
10.0000 mL | INTRAMUSCULAR | Status: DC | PRN
  Filled 2011-08-31: qty 10

## 2011-08-31 MED ORDER — HYDROMORPHONE HCL 4 MG PO TABS: 4.0000 mg | ORAL_TABLET | Freq: Four times a day (QID) | ORAL | Status: DC | PRN

## 2011-08-31 MED ORDER — HYDROMORPHONE HCL PF 4 MG/ML IJ SOLN
4.0000 mg | Freq: Once | INTRAMUSCULAR | Status: AC
  Administered 2011-08-31: 4 mg via INTRAVENOUS

## 2011-08-31 MED ORDER — DIPHENHYDRAMINE HCL 25 MG PO CAPS
25.0000 mg | ORAL_CAPSULE | Freq: Once | ORAL | Status: AC
  Administered 2011-08-31: 25 mg via ORAL

## 2011-08-31 MED ORDER — SODIUM CHLORIDE 0.9 % IJ SOLN
10.0000 mL | INTRAMUSCULAR | Status: AC | PRN
  Administered 2011-08-31: 10 mL
  Filled 2011-08-31: qty 10

## 2011-08-31 MED ORDER — HYDROXYUREA 500 MG PO CAPS: 500.0000 mg | ORAL_CAPSULE | Freq: Three times a day (TID) | ORAL | Status: DC

## 2011-08-31 MED ORDER — SODIUM CHLORIDE 0.9 % IV SOLN
250.0000 mL | Freq: Once | INTRAVENOUS | Status: AC
  Administered 2011-08-31: 250 mL via INTRAVENOUS

## 2011-08-31 MED ORDER — LIDOCAINE-PRILOCAINE 2.5-2.5 % EX CREA: TOPICAL_CREAM | CUTANEOUS | Status: DC | PRN

## 2011-08-31 NOTE — Progress Notes (Signed)
Christopher Warren presents today for phlebotomy per MD orders. Phlebotomy procedure started at0910 and ended at 0920 500 grams removed. Patient observed for 30 minutes after procedure without any incident. Patient tolerated procedure well. IV needle removed intact.

## 2011-08-31 NOTE — Patient Instructions (Signed)
Blood Transfusion Information  WHAT IS A BLOOD TRANSFUSION?  A transfusion is the replacement of blood or some of its parts. Blood is made up of multiple cells which provide different functions.   Red blood cells carry oxygen and are used for blood loss replacement.   White blood cells fight against infection.   Platelets control bleeding.   Plasma helps clot blood.   Other blood products are available for specialized needs, such as hemophilia or other clotting disorders.  BEFORE THE TRANSFUSION   Who gives blood for transfusions?    You may be able to donate blood to be used at a later date on yourself (autologous donation).   Relatives can be asked to donate blood. This is generally not any safer than if you have received blood from a stranger. The same precautions are taken to ensure safety when a relative's blood is donated.   Healthy volunteers who are fully evaluated to make sure their blood is safe. This is blood bank blood.  Transfusion therapy is the safest it has ever been in the practice of medicine. Before blood is taken from a donor, a complete history is taken to make sure that person has no history of diseases nor engages in risky social behavior (examples are intravenous drug use or sexual activity with multiple partners). The donor's travel history is screened to minimize risk of transmitting infections, such as malaria. The donated blood is tested for signs of infectious diseases, such as HIV and hepatitis. The blood is then tested to be sure it is compatible with you in order to minimize the chance of a transfusion reaction. If you or a relative donates blood, this is often done in anticipation of surgery and is not appropriate for emergency situations. It takes many days to process the donated blood.  RISKS AND COMPLICATIONS  Although transfusion therapy is very safe and saves many lives, the main dangers of transfusion include:    Getting an infectious disease.   Developing a  transfusion reaction. This is an allergic reaction to something in the blood you were given. Every precaution is taken to prevent this.  The decision to have a blood transfusion has been considered carefully by your caregiver before blood is given. Blood is not given unless the benefits outweigh the risks.  AFTER THE TRANSFUSION   Right after receiving a blood transfusion, you will usually feel much better and more energetic. This is especially true if your red blood cells have gotten low (anemic). The transfusion raises the level of the red blood cells which carry oxygen, and this usually causes an energy increase.   The nurse administering the transfusion will monitor you carefully for complications.  HOME CARE INSTRUCTIONS   No special instructions are needed after a transfusion. You may find your energy is better. Speak with your caregiver about any limitations on activity for underlying diseases you may have.  SEEK MEDICAL CARE IF:    Your condition is not improving after your transfusion.   You develop redness or irritation at the intravenous (IV) site.  SEEK IMMEDIATE MEDICAL CARE IF:   Any of the following symptoms occur over the next 12 hours:   Shaking chills.   You have a temperature by mouth above 102 F (38.9 C), not controlled by medicine.   Chest, back, or muscle pain.   People around you feel you are not acting correctly or are confused.   Shortness of breath or difficulty breathing.   Dizziness and fainting.     You get a rash or develop hives.   You have a decrease in urine output.   Your urine turns a dark color or changes to pink, red, or brown.  Any of the following symptoms occur over the next 10 days:   You have a temperature by mouth above 102 F (38.9 C), not controlled by medicine.   Shortness of breath.   Weakness after normal activity.   The white part of the eye turns yellow (jaundice).   You have a decrease in the amount of urine or are urinating less often.   Your  urine turns a dark color or changes to pink, red, or brown.  Document Released: 05/27/2000 Document Revised: 05/19/2011 Document Reviewed: 01/14/2008  Exit[employer] Patient Information 2012 [employer].

## 2011-09-01 ENCOUNTER — Ambulatory Visit (HOSPITAL_BASED_OUTPATIENT_CLINIC_OR_DEPARTMENT_OTHER): Payer: Medicaid Other

## 2011-09-01 VITALS — BP 114/68 | HR 96 | Temp 98.8°F | Resp 18

## 2011-09-01 DIAGNOSIS — D571 Sickle-cell disease without crisis: Secondary | ICD-10-CM

## 2011-09-01 LAB — TYPE AND SCREEN
Unit division: 0
Unit division: 0

## 2011-09-01 LAB — PREPARE RBC (CROSSMATCH)

## 2011-09-01 MED ORDER — SODIUM CHLORIDE 0.9 % IJ SOLN
10.0000 mL | INTRAMUSCULAR | Status: AC | PRN
  Administered 2011-09-01: 10 mL
  Filled 2011-09-01: qty 10

## 2011-09-01 MED ORDER — HYDROMORPHONE HCL PF 4 MG/ML IJ SOLN
4.0000 mg | Freq: Once | INTRAMUSCULAR | Status: AC
  Administered 2011-09-01: 4 mg via INTRAVENOUS

## 2011-09-01 MED ORDER — HEPARIN SOD (PORK) LOCK FLUSH 100 UNIT/ML IV SOLN
500.0000 [IU] | INTRAVENOUS | Status: AC | PRN
  Administered 2011-09-01: 500 [IU]
  Filled 2011-09-01: qty 5

## 2011-09-01 MED ORDER — ACETAMINOPHEN 325 MG PO TABS
650.0000 mg | ORAL_TABLET | Freq: Once | ORAL | Status: AC
  Administered 2011-09-01: 650 mg via ORAL

## 2011-09-01 MED ORDER — DIPHENHYDRAMINE HCL 25 MG PO CAPS
25.0000 mg | ORAL_CAPSULE | Freq: Once | ORAL | Status: AC
  Administered 2011-09-01: 25 mg via ORAL

## 2011-09-01 MED ORDER — SODIUM CHLORIDE 0.9 % IV SOLN
250.0000 mL | Freq: Once | INTRAVENOUS | Status: AC
  Administered 2011-09-01: 250 mL via INTRAVENOUS

## 2011-09-02 ENCOUNTER — Other Ambulatory Visit: Payer: Self-pay | Admitting: *Deleted

## 2011-09-02 ENCOUNTER — Ambulatory Visit (HOSPITAL_BASED_OUTPATIENT_CLINIC_OR_DEPARTMENT_OTHER): Payer: Medicaid Other

## 2011-09-02 ENCOUNTER — Encounter: Payer: Self-pay | Admitting: Hematology & Oncology

## 2011-09-02 ENCOUNTER — Other Ambulatory Visit: Payer: Self-pay

## 2011-09-02 VITALS — BP 102/48 | HR 75 | Temp 98.6°F | Resp 18

## 2011-09-02 DIAGNOSIS — D571 Sickle-cell disease without crisis: Secondary | ICD-10-CM

## 2011-09-02 MED ORDER — SODIUM CHLORIDE 0.9 % IV SOLN
250.0000 mL | Freq: Once | INTRAVENOUS | Status: AC
  Administered 2011-09-02: 250 mL via INTRAVENOUS

## 2011-09-02 MED ORDER — PROMETHAZINE HCL 25 MG/ML IJ SOLN
12.5000 mg | Freq: Once | INTRAMUSCULAR | Status: AC
  Administered 2011-09-02: 12.5 mg via INTRAVENOUS

## 2011-09-02 MED ORDER — DIPHENHYDRAMINE HCL 25 MG PO CAPS
25.0000 mg | ORAL_CAPSULE | Freq: Once | ORAL | Status: AC
  Administered 2011-09-02: 25 mg via ORAL

## 2011-09-02 MED ORDER — HYDROMORPHONE HCL PF 4 MG/ML IJ SOLN
4.0000 mg | Freq: Once | INTRAMUSCULAR | Status: AC
  Administered 2011-09-02: 4 mg via INTRAVENOUS

## 2011-09-02 MED ORDER — ACETAMINOPHEN 325 MG PO TABS
650.0000 mg | ORAL_TABLET | Freq: Once | ORAL | Status: AC
  Administered 2011-09-02: 650 mg via ORAL

## 2011-09-02 NOTE — Patient Instructions (Signed)
Blood Transfusion   A blood transfusion replaces your blood or some of its parts. Blood is replaced when you have lost blood because of surgery, an accident, or for severe blood conditions like anemia.  You can donate blood to be used on yourself if you have a planned surgery. If you lose blood during that surgery, your own blood can be given back to you.  Any blood given to you is checked to make sure it matches your blood type. Your temperature, blood pressure, and heart rate (vital signs) will be checked often.   GET HELP RIGHT AWAY IF:    You feel sick to your stomach (nauseous) or throw up (vomit).   You have watery poop (diarrhea).   You have shortness of breath or trouble breathing.   You have blood in your pee (urine) or have dark colored pee.   You have chest pain or tightness.   Your eyes or skin turn yellow (jaundice).   You have a temperature by mouth above 102 F (38.9 C), not controlled by medicine.   You start to shake and have chills.   You develop a a red rash (hives) or feel itchy.   You develop lightheadedness or feel confused.   You develop back, joint, or muscle pain.   You do not feel hungry (lost appetite).   You feel tired, restless, or nervous.   You develop belly (abdominal) cramps.  Document Released: 08/26/2008 Document Revised: 05/19/2011 Document Reviewed: 08/26/2008  [employer] Patient Information 2012 [employer].

## 2011-09-03 LAB — TYPE AND SCREEN

## 2011-09-12 ENCOUNTER — Other Ambulatory Visit: Payer: Self-pay | Admitting: *Deleted

## 2011-09-12 ENCOUNTER — Encounter (HOSPITAL_COMMUNITY)
Admission: RE | Admit: 2011-09-12 | Discharge: 2011-09-12 | Disposition: A | Discharge status: Home / Self Care | Payer: Medicaid Other | Source: Ambulatory Visit | Attending: Hematology & Oncology | Admitting: Hematology & Oncology

## 2011-09-12 DIAGNOSIS — D571 Sickle-cell disease without crisis: Secondary | ICD-10-CM

## 2011-09-12 MED ORDER — HYDROMORPHONE HCL 4 MG PO TABS: 4.0000 mg | ORAL_TABLET | Freq: Four times a day (QID) | ORAL | Status: DC | PRN

## 2011-09-12 NOTE — Telephone Encounter (Signed)
Pt called requesting a Dilaudid refill. Reviewed with Dr Myna Hidalgo. He ok'd the fill but wanted to know if he had considered going on the long acting medication. The pt stated "not this time but probably next time". Will route Dr Myna Hidalgo the rx to sign then place up for him to pick up.

## 2011-09-27 ENCOUNTER — Ambulatory Visit (HOSPITAL_BASED_OUTPATIENT_CLINIC_OR_DEPARTMENT_OTHER): Payer: Medicaid Other

## 2011-09-27 ENCOUNTER — Other Ambulatory Visit: Payer: Self-pay | Admitting: *Deleted

## 2011-09-27 VITALS — BP 109/57 | HR 78 | Temp 97.9°F

## 2011-09-27 DIAGNOSIS — D571 Sickle-cell disease without crisis: Secondary | ICD-10-CM

## 2011-09-27 MED ORDER — PROMETHAZINE HCL 25 MG/ML IJ SOLN
12.5000 mg | Freq: Four times a day (QID) | INTRAMUSCULAR | Status: DC | PRN
  Administered 2011-09-27: 12.5 mg via INTRAVENOUS

## 2011-09-27 MED ORDER — HYDROMORPHONE HCL PF 4 MG/ML IJ SOLN: 4.0000 mg | Freq: Once | INTRAMUSCULAR | Status: DC

## 2011-09-27 MED ORDER — HEPARIN SOD (PORK) LOCK FLUSH 100 UNIT/ML IV SOLN
500.0000 [IU] | Freq: Once | INTRAVENOUS | Status: AC
  Administered 2011-09-27: 500 [IU] via INTRAVENOUS
  Filled 2011-09-27: qty 5

## 2011-09-27 MED ORDER — HYDROMORPHONE HCL PF 4 MG/ML IJ SOLN
4.0000 mg | Freq: Once | INTRAMUSCULAR | Status: AC
  Administered 2011-09-27: 4 mg via INTRAVENOUS

## 2011-09-27 MED ORDER — SODIUM CHLORIDE 0.9 % IV SOLN
INTRAVENOUS | Status: DC
  Administered 2011-09-27: 13:00:00 via INTRAVENOUS

## 2011-09-27 MED ORDER — PROMETHAZINE HCL 25 MG/ML IJ SOLN: 12.5000 mg | Freq: Once | INTRAMUSCULAR | Status: DC

## 2011-09-27 MED ORDER — SODIUM CHLORIDE 0.9 % IJ SOLN
10.0000 mL | INTRAMUSCULAR | Status: DC | PRN
  Administered 2011-09-27: 10 mL via INTRAVENOUS
  Filled 2011-09-27: qty 10

## 2011-09-27 NOTE — Progress Notes (Signed)
Pt called stating that he was feeling bad today. Asked him to explain what feeling bad meant. He stated that his back was hurting and was feeling achy all over. He has tried to treat it with increasing his fluids and pain medicine but he still is feeling poorly. Reviewed with Dr Myna Hidalgo. Ok to come in today for IVF and pain medication but must be here by 1230. He verbalized understanding and stated he would be.

## 2011-10-03 ENCOUNTER — Observation Stay (HOSPITAL_COMMUNITY): Payer: Medicaid Other

## 2011-10-03 ENCOUNTER — Encounter (HOSPITAL_COMMUNITY): Payer: Self-pay | Admitting: Emergency Medicine

## 2011-10-03 ENCOUNTER — Inpatient Hospital Stay (HOSPITAL_COMMUNITY)
Admission: EM | Admit: 2011-10-03 | Discharge: 2011-10-07 | DRG: 812 | Disposition: A | Discharge status: Home / Self Care | Payer: Medicaid Other | Attending: Hematology & Oncology | Admitting: Hematology & Oncology

## 2011-10-03 DIAGNOSIS — F17200 Nicotine dependence, unspecified, uncomplicated: Secondary | ICD-10-CM | POA: Diagnosis present

## 2011-10-03 DIAGNOSIS — D571 Sickle-cell disease without crisis: Secondary | ICD-10-CM

## 2011-10-03 DIAGNOSIS — D599 Acquired hemolytic anemia, unspecified: Secondary | ICD-10-CM | POA: Diagnosis present

## 2011-10-03 DIAGNOSIS — D5700 Hb-SS disease with crisis, unspecified: Principal | ICD-10-CM | POA: Diagnosis present

## 2011-10-03 DIAGNOSIS — M545 Low back pain, unspecified: Secondary | ICD-10-CM | POA: Diagnosis present

## 2011-10-03 DIAGNOSIS — J45909 Unspecified asthma, uncomplicated: Secondary | ICD-10-CM | POA: Diagnosis present

## 2011-10-03 DIAGNOSIS — D72829 Elevated white blood cell count, unspecified: Secondary | ICD-10-CM | POA: Diagnosis present

## 2011-10-03 DIAGNOSIS — L738 Other specified follicular disorders: Secondary | ICD-10-CM | POA: Diagnosis present

## 2011-10-03 HISTORY — DX: Cardiac murmur, unspecified: R01.1

## 2011-10-03 LAB — COMPREHENSIVE METABOLIC PANEL
ALT: 31 U/L (ref 0–53)
AST: 37 U/L (ref 0–37)
Albumin: 3.7 g/dL (ref 3.5–5.2)
Alkaline Phosphatase: 104 U/L (ref 39–117)
CO2: 28 mEq/L (ref 19–32)
Chloride: 101 mEq/L (ref 96–112)
Creatinine, Ser: 0.54 mg/dL (ref 0.50–1.35)
GFR calc non Af Amer: >90 mL/min (ref >90)
Potassium: 4.1 mEq/L (ref 3.5–5.1)
Total Bilirubin: 3.8 mg/dL — ABNORMAL HIGH (ref 0.3–1.2)

## 2011-10-03 LAB — URINALYSIS, ROUTINE W REFLEX MICROSCOPIC
Bilirubin Urine: NEGATIVE
Glucose, UA: NEGATIVE mg/dL
Hgb urine dipstick: NEGATIVE
Ketones, ur: NEGATIVE mg/dL
Protein, ur: NEGATIVE mg/dL
Urobilinogen, UA: 1 mg/dL (ref 0.0–1.0)

## 2011-10-03 LAB — DIFFERENTIAL
Basophils Absolute: 0.1 10*3/uL (ref 0.0–0.1)
Basophils Relative: 0 % (ref 0–1)
Lymphocytes Relative: 15 % (ref 12–46)
Monocytes Absolute: 1.1 10*3/uL — ABNORMAL HIGH (ref 0.1–1.0)
Monocytes Relative: 7 % (ref 3–12)
Neutro Abs: 12.3 10*3/uL — ABNORMAL HIGH (ref 1.7–7.7)
Neutrophils Relative %: 76 % (ref 43–77)

## 2011-10-03 LAB — CBC
HCT: 26 % — ABNORMAL LOW (ref 39.0–52.0)
Hemoglobin: 9 g/dL — ABNORMAL LOW (ref 13.0–17.0)
MCHC: 34.6 g/dL (ref 30.0–36.0)
RDW: 20.8 % — ABNORMAL HIGH (ref 11.5–15.5)
WBC: 16.2 10*3/uL — ABNORMAL HIGH (ref 4.0–10.5)

## 2011-10-03 LAB — PROTIME-INR
INR: 1.13 (ref 0.00–1.49)
Prothrombin Time: 14.7 seconds (ref 11.6–15.2)

## 2011-10-03 LAB — RETICULOCYTES: Retic Ct Pct: 13.4 % — ABNORMAL HIGH (ref 0.4–3.1)

## 2011-10-03 MED ORDER — HYDROMORPHONE HCL PF 1 MG/ML IJ SOLN: 1.0000 mg | INTRAMUSCULAR | Status: DC | PRN

## 2011-10-03 MED ORDER — SODIUM CHLORIDE 0.9 % IV SOLN
1000.0000 mL | Freq: Once | INTRAVENOUS | Status: AC
  Administered 2011-10-03: 1000 mL via INTRAVENOUS

## 2011-10-03 MED ORDER — ONDANSETRON HCL 4 MG/2ML IJ SOLN: 4.0000 mg | Freq: Three times a day (TID) | INTRAMUSCULAR | Status: DC | PRN

## 2011-10-03 MED ORDER — ONDANSETRON HCL 4 MG/2ML IJ SOLN
4.0000 mg | Freq: Four times a day (QID) | INTRAMUSCULAR | Status: DC | PRN
  Administered 2011-10-03: 4 mg via INTRAVENOUS

## 2011-10-03 MED ORDER — DIPHENHYDRAMINE HCL 50 MG/ML IJ SOLN
25.0000 mg | Freq: Once | INTRAMUSCULAR | Status: AC
  Administered 2011-10-03: 25 mg via INTRAVENOUS
  Filled 2011-10-03: qty 1

## 2011-10-03 MED ORDER — ONDANSETRON HCL 4 MG/2ML IJ SOLN
4.0000 mg | Freq: Four times a day (QID) | INTRAMUSCULAR | Status: DC | PRN
  Filled 2011-10-03: qty 2

## 2011-10-03 MED ORDER — HYDROMORPHONE HCL PF 1 MG/ML IJ SOLN
1.0000 mg | Freq: Once | INTRAMUSCULAR | Status: AC
  Administered 2011-10-03: 1 mg via INTRAVENOUS
  Filled 2011-10-03: qty 1

## 2011-10-03 MED ORDER — SODIUM CHLORIDE 0.9 % IV SOLN
1000.0000 mL | INTRAVENOUS | Status: DC
  Administered 2011-10-03: 1000 mL via INTRAVENOUS

## 2011-10-03 MED ORDER — HYDROMORPHONE HCL PF 2 MG/ML IJ SOLN
2.0000 mg | Freq: Once | INTRAMUSCULAR | Status: AC
  Administered 2011-10-03: 2 mg via INTRAVENOUS
  Filled 2011-10-03: qty 1

## 2011-10-03 MED ORDER — HYDROMORPHONE HCL PF 1 MG/ML IJ SOLN
1.0000 mg | INTRAMUSCULAR | Status: DC | PRN
  Administered 2011-10-04: 1 mg via INTRAVENOUS
  Filled 2011-10-03: qty 1

## 2011-10-03 MED ORDER — ACETAMINOPHEN 325 MG PO TABS
650.0000 mg | ORAL_TABLET | ORAL | Status: DC | PRN
  Filled 2011-10-03: qty 2

## 2011-10-03 MED ORDER — SODIUM CHLORIDE 0.9 % IV SOLN
INTRAVENOUS | Status: AC
  Administered 2011-10-04: 01:00:00 via INTRAVENOUS

## 2011-10-03 NOTE — ED Notes (Signed)
Pt states pain began last night, pain only located in lower back. Pt states last crisis was roughly two months ago.

## 2011-10-03 NOTE — ED Provider Notes (Signed)
History     CSN: [id number]  Arrival date & time 10/03/11  1713   First MD Initiated Contact with Patient 10/03/11 1834      Chief Complaint  Patient presents with  . Sickle Cell Pain Crisis    (Consider location/radiation/quality/duration/timing/severity/associated sxs/prior treatment) HPI Patient complaining of pain again today in his low back. He states this is consistent with his previous sickle cell pain. He has had some low-grade fever to 99. He has not any nausea or vomiting. He is taking his Dilaudid without control of pain. He states that he was last transfused a week ago. He denies any cough or chest pain. Past Medical History  Diagnosis Date  . Sickle cell anemia   . Asthma   . 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 Heart murmur     History reviewed. No pertinent past surgical history.  No family history on file.  History  Substance Use Topics  . Smoking status: Current Everyday Smoker  . Smokeless tobacco: Not on file  . Alcohol Use: No      Review of Systems  All other systems reviewed and are negative.    Allergies  Morphine and related  Home Medications   Current Outpatient Rx  Name Route Sig Dispense Refill  . DEFERASIROX 125 MG PO TBSO Oral Take 250 mg by mouth daily.     Marland Kitchen FOLIC ACID 1 MG PO TABS Oral Take 1 mg by mouth daily.      Marland Kitchen HYDROMORPHONE HCL 4 MG PO TABS Oral Take 1 tablet (4 mg total) by mouth every 6 (six) hours as needed for pain. 120 tablet 0    1 month supply  . HYDROXYUREA 500 MG PO CAPS Oral Take 1 capsule (500 mg total) by mouth 3 (three) times daily. May take with food to minimize GI side effects. 90 capsule 4  . LIDOCAINE-PRILOCAINE 2.5-2.5 % EX CREA Topical Apply topically as needed. 30 g 0  . WARFARIN SODIUM 1 MG PO TABS  Take 1 tab nightly for port maintenance. 30 tablet 6  . PREDNISOLONE SODIUM PHOSPHATE 1 % OP SOLN Both Eyes Place 1 drop into both eyes 2 (two) times daily. 10 mL 0    BP 118/44  Pulse 76  Temp(Src)  99 F (37.2 C) (Oral)  Resp 16  SpO2 100%  Physical Exam  Nursing note and vitals reviewed. Constitutional: He is oriented to person, place, and time. He appears well-developed and well-nourished.  HENT:  Head: Normocephalic and atraumatic.  Right Ear: External ear normal.  Left Ear: External ear normal.  Nose: Nose normal.  Mouth/Throat: Oropharynx is clear and moist.  Eyes: EOM are normal. Pupils are equal, round, and reactive to light.       Mild scleral icterus with pale conjunctiva  Neck: Normal range of motion. Neck supple.  Cardiovascular: Normal rate, regular rhythm, normal heart sounds and intact distal pulses.   Pulmonary/Chest: Effort normal and breath sounds normal.  Abdominal: Soft. Bowel sounds are normal.  Musculoskeletal: Normal range of motion.  Neurological: He is alert and oriented to person, place, and time. He has normal reflexes.  Skin: Skin is warm and dry.  Psychiatric: He has a normal mood and affect. His behavior is normal. Thought content normal.    ED Course  Procedures (including critical care time)  Labs Reviewed  CBC - Abnormal; Notable for the following:    WBC 16.2 (*)    RBC 2.83 (*)    Hemoglobin 9.0 (*)  HCT 26.0 (*)    RDW 20.8 (*)    All other components within normal limits  DIFFERENTIAL - Abnormal; Notable for the following:    Neutro Abs 12.3 (*)    Monocytes Absolute 1.1 (*)    All other components within normal limits  COMPREHENSIVE METABOLIC PANEL - Abnormal; Notable for the following:    Glucose, Bld 115 (*)    Total Bilirubin 3.8 (*)    All other components within normal limits  RETICULOCYTES - Abnormal; Notable for the following:    Retic Ct Pct 13.4 (*)    RBC. 2.83 (*)    Retic Count, Manual 379.2 (*)    All other components within normal limits  URINALYSIS, ROUTINE W REFLEX MICROSCOPIC  PROTIME-INR   No results found.   No diagnosis found.  Results for orders placed during the hospital encounter of 10/03/11    CBC      Component Value Range   WBC 16.2 (*) 4.0 - 10.5 (K/uL)   RBC 2.83 (*) 4.22 - 5.81 (MIL/uL)   Hemoglobin 9.0 (*) 13.0 - 17.0 (g/dL)   HCT 40.9 (*) 81.1 - 52.0 (%)   MCV 91.9  78.0 - 100.0 (fL)   MCH 31.8  26.0 - 34.0 (pg)   MCHC 34.6  30.0 - 36.0 (g/dL)   RDW 91.4 (*) 78.2 - 15.5 (%)   Platelets 271  150 - 400 (K/uL)  DIFFERENTIAL      Component Value Range   Neutrophils Relative 76  43 - 77 (%)   Neutro Abs 12.3 (*) 1.7 - 7.7 (K/uL)   Lymphocytes Relative 15  12 - 46 (%)   Lymphs Abs 2.4  0.7 - 4.0 (K/uL)   Monocytes Relative 7  3 - 12 (%)   Monocytes Absolute 1.1 (*) 0.1 - 1.0 (K/uL)   Eosinophils Relative 2  0 - 5 (%)   Eosinophils Absolute 0.4  0.0 - 0.7 (K/uL)   Basophils Relative 0  0 - 1 (%)   Basophils Absolute 0.1  0.0 - 0.1 (K/uL)  COMPREHENSIVE METABOLIC PANEL      Component Value Range   Sodium 135  135 - 145 (mEq/L)   Potassium 4.1  3.5 - 5.1 (mEq/L)   Chloride 101  96 - 112 (mEq/L)   CO2 28  19 - 32 (mEq/L)   Glucose, Bld 115 (*) 70 - 99 (mg/dL)   BUN 8  6 - 23 (mg/dL)   Creatinine, Ser 9.56  0.50 - 1.35 (mg/dL)   Calcium 9.4  8.4 - 21.3 (mg/dL)   Total Protein 8.0  6.0 - 8.3 (g/dL)   Albumin 3.7  3.5 - 5.2 (g/dL)   AST 37  0 - 37 (U/L)   ALT 31  0 - 53 (U/L)   Alkaline Phosphatase 104  39 - 117 (U/L)   Total Bilirubin 3.8 (*) 0.3 - 1.2 (mg/dL)   GFR calc non Af Amer >90  >90 (mL/min)   GFR calc Af Amer >90  >90 (mL/min)  URINALYSIS, ROUTINE W REFLEX MICROSCOPIC      Component Value Range   Color, Urine YELLOW  YELLOW    APPearance CLEAR  CLEAR    Specific Gravity, Urine 1.009  1.005 - 1.030    pH 6.0  5.0 - 8.0    Glucose, UA NEGATIVE  NEGATIVE (mg/dL)   Hgb urine dipstick NEGATIVE  NEGATIVE    Bilirubin Urine NEGATIVE  NEGATIVE    Ketones, ur NEGATIVE  NEGATIVE (mg/dL)  Protein, ur NEGATIVE  NEGATIVE (mg/dL)   Urobilinogen, UA 1.0  0.0 - 1.0 (mg/dL)   Nitrite NEGATIVE  NEGATIVE    Leukocytes, UA NEGATIVE  NEGATIVE   RETICULOCYTES       Component Value Range   Retic Ct Pct 13.4 (*) 0.4 - 3.1 (%)   RBC. 2.83 (*) 4.22 - 5.81 (MIL/uL)   Retic Count, Manual 379.2 (*) 19.0 - 186.0 (K/uL)  PROTIME-INR      Component Value Range   Prothrombin Time 14.7  11.6 - 15.2 (seconds)   INR 1.13  0.00 - 1.49      MDM  Patient with poor pain control here on IV Dilaudid. Care discussed with Dr. Angus Palms and patient admitted for further pain control.        Hilario Quarry, MD 10/03/11 [phone]

## 2011-10-03 NOTE — ED Notes (Signed)
Pt out in hall speaking loudly upset due to wait time  Pt states he has been here for 3 hrs and has not seen anyone  Explained to pt that I had been in room and explained to him that we had called IV team to access port, collect blood and then pain meds would be given  Pt back to room  Family at bedside

## 2011-10-04 ENCOUNTER — Encounter (HOSPITAL_COMMUNITY): Payer: Self-pay | Admitting: Internal Medicine

## 2011-10-04 DIAGNOSIS — D72829 Elevated white blood cell count, unspecified: Secondary | ICD-10-CM | POA: Diagnosis present

## 2011-10-04 LAB — BASIC METABOLIC PANEL
BUN: 9 mg/dL (ref 6–23)
CO2: 28 mEq/L (ref 19–32)
Calcium: 9.1 mg/dL (ref 8.4–10.5)
Creatinine, Ser: 0.59 mg/dL (ref 0.50–1.35)
GFR calc Af Amer: >90 mL/min (ref >90)

## 2011-10-04 LAB — CBC
HCT: 24.1 % — ABNORMAL LOW (ref 39.0–52.0)
MCHC: 33.6 g/dL (ref 30.0–36.0)
MCV: 93.4 fL (ref 78.0–100.0)
Platelets: 233 10*3/uL (ref 150–400)
RDW: 20.6 % — ABNORMAL HIGH (ref 11.5–15.5)
WBC: 16.6 10*3/uL — ABNORMAL HIGH (ref 4.0–10.5)

## 2011-10-04 MED ORDER — FOLIC ACID 1 MG PO TABS
1.0000 mg | ORAL_TABLET | Freq: Every day | ORAL | Status: DC
  Administered 2011-10-04 – 2011-10-07 (×4): 1 mg via ORAL
  Filled 2011-10-04 (×4): qty 1

## 2011-10-04 MED ORDER — ONDANSETRON HCL 4 MG/2ML IJ SOLN
4.0000 mg | Freq: Four times a day (QID) | INTRAMUSCULAR | Status: DC | PRN
  Administered 2011-10-04 – 2011-10-07 (×11): 4 mg via INTRAVENOUS
  Filled 2011-10-04 (×12): qty 2

## 2011-10-04 MED ORDER — POLYETHYLENE GLYCOL 3350 17 G PO PACK
17.0000 g | PACK | Freq: Every day | ORAL | Status: DC | PRN
  Filled 2011-10-04: qty 1

## 2011-10-04 MED ORDER — DIPHENHYDRAMINE HCL 25 MG PO CAPS
25.0000 mg | ORAL_CAPSULE | Freq: Three times a day (TID) | ORAL | Status: DC | PRN
  Administered 2011-10-04: 25 mg via ORAL
  Filled 2011-10-04: qty 1

## 2011-10-04 MED ORDER — HYDROMORPHONE HCL PF 2 MG/ML IJ SOLN
4.0000 mg | INTRAMUSCULAR | Status: DC | PRN
Start: 2011-10-04 — End: 2011-10-07
  Administered 2011-10-04 – 2011-10-07 (×36): 4 mg via INTRAVENOUS
  Filled 2011-10-04 (×40): qty 2

## 2011-10-04 MED ORDER — HYDROMORPHONE HCL 4 MG PO TABS: 4.0000 mg | ORAL_TABLET | Freq: Four times a day (QID) | ORAL | Status: DC | PRN

## 2011-10-04 MED ORDER — WARFARIN - PHYSICIAN DOSING INPATIENT
Freq: Every day | Status: DC
  Administered 2011-10-07: 18:00:00

## 2011-10-04 MED ORDER — SODIUM CHLORIDE 0.9 % IV SOLN: INTRAVENOUS | Status: DC

## 2011-10-04 MED ORDER — PROMETHAZINE HCL 25 MG/ML IJ SOLN
12.5000 mg | Freq: Four times a day (QID) | INTRAMUSCULAR | Status: DC | PRN
  Administered 2011-10-05 – 2011-10-06 (×5): 12.5 mg via INTRAVENOUS
  Filled 2011-10-04 (×5): qty 1

## 2011-10-04 MED ORDER — HYDROXYUREA 500 MG PO CAPS
500.0000 mg | ORAL_CAPSULE | Freq: Three times a day (TID) | ORAL | Status: DC
  Administered 2011-10-04 – 2011-10-07 (×11): 500 mg via ORAL
  Filled 2011-10-04 (×12): qty 1

## 2011-10-04 MED ORDER — HYDROMORPHONE HCL PF 2 MG/ML IJ SOLN: 4.0000 mg | Freq: Four times a day (QID) | INTRAMUSCULAR | Status: DC | PRN

## 2011-10-04 MED ORDER — HYDROMORPHONE HCL PF 2 MG/ML IJ SOLN
4.0000 mg | Freq: Four times a day (QID) | INTRAMUSCULAR | Status: DC | PRN
  Administered 2011-10-04: 4 mg via INTRAVENOUS
  Filled 2011-10-04: qty 2

## 2011-10-04 MED ORDER — DOCUSATE SODIUM 100 MG PO CAPS
100.0000 mg | ORAL_CAPSULE | Freq: Two times a day (BID) | ORAL | Status: DC
  Administered 2011-10-04 – 2011-10-06 (×3): 100 mg via ORAL
  Filled 2011-10-04 (×8): qty 1

## 2011-10-04 MED ORDER — DIPHENHYDRAMINE HCL 50 MG/ML IJ SOLN
25.0000 mg | Freq: Four times a day (QID) | INTRAMUSCULAR | Status: DC | PRN
  Administered 2011-10-04 – 2011-10-06 (×5): 25 mg via INTRAVENOUS
  Filled 2011-10-04 (×8): qty 1

## 2011-10-04 MED ORDER — ONDANSETRON HCL 4 MG PO TABS
4.0000 mg | ORAL_TABLET | Freq: Four times a day (QID) | ORAL | Status: DC | PRN
  Administered 2011-10-04: 4 mg via ORAL
  Filled 2011-10-04: qty 1

## 2011-10-04 MED ORDER — ACETAMINOPHEN 650 MG RE SUPP: 650.0000 mg | Freq: Four times a day (QID) | RECTAL | Status: DC | PRN

## 2011-10-04 MED ORDER — DEFERASIROX 125 MG PO TBSO: 250.0000 mg | ORAL_TABLET | Freq: Every day | ORAL | Status: DC

## 2011-10-04 MED ORDER — LORAZEPAM 0.5 MG PO TABS
0.5000 mg | ORAL_TABLET | Freq: Every evening | ORAL | Status: DC | PRN
  Administered 2011-10-04 – 2011-10-06 (×5): 0.5 mg via ORAL
  Filled 2011-10-04 (×5): qty 1

## 2011-10-04 MED ORDER — HYDROMORPHONE HCL PF 2 MG/ML IJ SOLN
3.0000 mg | Freq: Once | INTRAMUSCULAR | Status: AC
Start: 2011-10-04 — End: 2011-10-04
  Administered 2011-10-04: 3 mg via INTRAVENOUS
  Filled 2011-10-04 (×2): qty 1

## 2011-10-04 MED ORDER — HYDROMORPHONE HCL PF 2 MG/ML IJ SOLN
4.0000 mg | INTRAMUSCULAR | Status: DC | PRN
  Administered 2011-10-04: 4 mg via INTRAVENOUS
  Filled 2011-10-04: qty 2

## 2011-10-04 MED ORDER — ACETAMINOPHEN 325 MG PO TABS: 650.0000 mg | ORAL_TABLET | Freq: Four times a day (QID) | ORAL | Status: DC | PRN

## 2011-10-04 MED ORDER — SENNA 8.6 MG PO TABS
1.0000 | ORAL_TABLET | Freq: Two times a day (BID) | ORAL | Status: DC
  Administered 2011-10-04 – 2011-10-06 (×3): 8.6 mg via ORAL
  Filled 2011-10-04 (×5): qty 1
  Filled 2011-10-04: qty 2

## 2011-10-04 MED ORDER — DIPHENHYDRAMINE HCL 25 MG PO CAPS: 25.0000 mg | ORAL_CAPSULE | Freq: Four times a day (QID) | ORAL | Status: DC | PRN

## 2011-10-04 MED ORDER — WARFARIN SODIUM 1 MG PO TABS
1.0000 mg | ORAL_TABLET | Freq: Every day | ORAL | Status: DC
  Administered 2011-10-04 – 2011-10-07 (×4): 1 mg via ORAL
  Filled 2011-10-04 (×4): qty 1

## 2011-10-04 MED ORDER — SODIUM CHLORIDE 0.45 % IV SOLN
INTRAVENOUS | Status: DC
  Administered 2011-10-04 – 2011-10-05 (×2): via INTRAVENOUS

## 2011-10-04 NOTE — Progress Notes (Signed)
Security and Huntley Dec, the [facility], have been on the floor talking to the patient about going off the floor and leaving the hospital campus. Pt informed that the facility is tobacco-free and that security will be notified if he attempts to leave the unit again. Per Huntley Dec, pt is agreeable to stay the night and wants to see. Dr. Myna Hidalgo in the AM.

## 2011-10-04 NOTE — Progress Notes (Signed)
Subjective: Still complaining of generalized pain and itching.  Objective: Vital signs in last 24 hours: Filed Vitals:   10/03/11 1734 10/03/11 1924 10/04/11 0303 10/04/11 0558  BP: 124/66 118/44 138/81 128/65  Pulse: 91 76 76 70  Temp: 99.3 F (37.4 C) 99 F (37.2 C) 98.8 F (37.1 C) 98.5 F (36.9 C)  TempSrc: Oral Oral Oral Oral  Resp: 18 16 18 18   Height:   5\' 5"  (1.651 m)   Weight:   62.6 kg (138 lb 0.1 oz)   SpO2: 97% 100% 96% 97%   Weight change:   Intake/Output Summary (Last 24 hours) at 10/04/11 0917 Last data filed at 10/04/11 0700  Gross per 24 hour  Intake   1892 ml  Output    450 ml  Net   1442 ml    Physical Exam: General: Awake, Oriented, No acute distress. HEENT: EOMI. Neck: Supple CV: S1 and S2 Lungs: Clear to ascultation bilaterally Abdomen: Soft, Nontender, Nondistended, +bowel sounds. Ext: Good pulses. Trace edema.  Lab Results:  [facility] 10/04/11 0620 10/03/11 2100  NA 134* 135  K 4.2 4.1  CL 101 101  CO2 28 28  GLUCOSE 90 115*  BUN 9 8  CREATININE 0.59 0.54  CALCIUM 9.1 9.4  MG -- --  PHOS -- --    Basename 10/03/11 2100  AST 37  ALT 31  ALKPHOS 104  BILITOT 3.8*  PROT 8.0  ALBUMIN 3.7   No results found for this basename: LIPASE:2,AMYLASE:2 in the last 72 hours  Basename 10/04/11 0620 10/03/11 2100  WBC 16.6* 16.2*  NEUTROABS -- 12.3*  HGB 8.1* 9.0*  HCT 24.1* 26.0*  MCV 93.4 91.9  PLT 233 271   No results found for this basename: CKTOTAL:3,CKMB:3,CKMBINDEX:3,TROPONINI:3 in the last 72 hours No components found with this basename: POCBNP:3 No results found for this basename: DDIMER:2 in the last 72 hours No results found for this basename: HGBA1C:2 in the last 72 hours No results found for this basename: CHOL:2,HDL:2,LDLCALC:2,TRIG:2,CHOLHDL:2,LDLDIRECT:2 in the last 72 hours No results found for this basename: TSH,T4TOTAL,FREET3,T3FREE,THYROIDAB in the last 72 hours  Basename 10/03/11 2100  VITAMINB12 --  FOLATE  --  FERRITIN --  TIBC --  IRON --  RETICCTPCT 13.4*    Micro Results: No results found for this or any previous visit (from the past 240 hour(s)).  Studies/Results: Dg Chest 2 View  10/04/2011  *RADIOLOGY REPORT*  Clinical Data: Sickle cell crisis.  Pain.  Leukocytosis.  CHEST - 2 VIEW  Comparison: 03/15/2011  Findings: Interval placement of a power port type right central venous catheter with tip over the low SVC region.  Mild cardiac enlargement with mild increased pulmonary vascularity, stable since previous study.  No focal airspace consolidation in the lungs.  No blunting of costophrenic angles.  No pneumothorax.  IMPRESSION: Stable cardiac enlargement and pulmonary vascular prominence. Interval placement of power port central venous catheter with tip over the low SVC region.  No acute consolidation.  Original Report Authenticated By: [name], M.D.    Medications: I have reviewed the patient's current medications. Scheduled Meds:   . sodium chloride  1,000 mL Intravenous Once  . sodium chloride   Intravenous STAT  . deferasirox  250 mg Oral Daily  . diphenhydrAMINE  25 mg Intravenous Once  . docusate sodium  100 mg Oral BID  . folic acid  1 mg Oral Daily  .  HYDROmorphone (DILAUDID) injection  1 mg Intravenous Once  .  HYDROmorphone (DILAUDID)  injection  2 mg Intravenous Once  .  HYDROmorphone (DILAUDID) injection  2 mg Intravenous Once  .  HYDROmorphone (DILAUDID) injection  3 mg Intravenous Once  . hydroxyurea  500 mg Oral TID  . senna  1 tablet Oral BID  . warfarin  1 mg Oral q1800  . Warfarin - Physician Dosing Inpatient   Does not apply q1800   Continuous Infusions:   . sodium chloride 100 mL/hr at 10/04/11 0827  . DISCONTD: sodium chloride 1,000 mL (10/03/11 2300)  . DISCONTD: sodium chloride     PRN Meds:.acetaminophen, acetaminophen, diphenhydrAMINE, HYDROmorphone, HYDROmorphone, LORazepam, ondansetron (ZOFRAN) IV, ondansetron, polyethylene glycol,  promethazine, DISCONTD: acetaminophen, DISCONTD: diphenhydrAMINE, DISCONTD: diphenhydrAMINE, DISCONTD: HYDROmorphone, DISCONTD:  HYDROmorphone (DILAUDID) injection, DISCONTD:  HYDROmorphone (DILAUDID) injection, DISCONTD: HYDROmorphone, DISCONTD: HYDROmorphone, DISCONTD: ondansetron DISCONTD: ondansetron, DISCONTD: ondansetron (ZOFRAN) IV  Assessment/Plan: Sickle cell pain crisis with pain Uncomplicated. UA and chest x-ray not suggestive of infectious process. Continue dilaudid 4 mg IV q2h PRN for pain with benadryl, zofran and phenergan as needed. Hgb appears stable, if still having pain, consider exchange transfusion. Continue home hydrea, folate, coumadin for port patency, exjade. Hypotonic IVF maintenance, oxygen.  Anemia Secondary to sickle cell disease.  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 Continue to monitor.   DVT prophy: Pt is fully ambulatory   Full code.  Discussed with Dr. Myna Hidalgo, who will assume patient's care tomorrow morning. Appreciate Dr. Gustavo Lah help.   LOS: 1 day  Montgomery Rothlisberger A, MD 10/04/2011, 9:17 AM

## 2011-10-04 NOTE — ED Notes (Signed)
Dr. Bonno in to see pt at this time. 

## 2011-10-04 NOTE — Progress Notes (Signed)
UR complete 

## 2011-10-04 NOTE — H&P (Signed)
PCP:  He considers Dr. Myna Hidalgo his primary care physician   Chief Complaint:  Lower back pain  HPI: 35yoM with h/o sickle cell disease presents with sickle cell pain crisis.   Pt is reliable historian, he states that he's recently been under a lot of stress as his wife  (who also has sickle cell disease) had a brother who died of HF. He usually tries to take good  care of himself, eating right, exercising, controlling his pain at home with meds but over the  past few days, lower back pain has gotten worse for which he called Dr. Gustavo Lah office and  tried to manage as outpt, but apparently eventually presented to ED for management. He denies any  chest pain, SOB, or pain elsewhere, and his lower back pain he states is well within his known  sickle cell disease.   In the ED, vitals stable. Labs with normal chem and LFT's except elevated Tbili to 3.8.  Luekocytosis to 16.2, Hgb 9.0 is stable appearing. UA negative. CXR showed cardiac enalrgement  and pulmonary vascular prominence, new power port a cath, no consolidation. Pt given 1L of NS x2,  25 mg benadryl, 1mg  dilaudid x2, then 2mg  x2, zofran 4mg  x1. Admission requested for pain  control.   ROS as above also with possible low grade subjective fever. Otherwise negative. Many of his  family members have sickle cell as well. He follows pretty well with Dr. Myna Hidalgo and gets  exchange transfusions.      Past Medical History  Diagnosis Date  . Sickle cell anemia   . Asthma   . 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 Heart murmur     History reviewed. No pertinent past surgical history.  Medications:  HOME MEDS: Pt can name his meds quite well  Prior to Admission medications   Medication Sig Start Date End Date Taking? Authorizing Provider  deferasirox (EXJADE) 125 MG disintegrating tablet Take 250 mg by mouth daily.    Yes Historical Provider, MD  folic acid (FOLVITE) 1 MG tablet Take 1 mg by mouth daily.     Yes Historical  Provider, MD  HYDROmorphone (DILAUDID) 4 MG tablet Take 1 tablet (4 mg total) by mouth every 6 (six) hours as needed for pain. 09/12/11  Yes [name], MD  hydroxyurea (HYDREA) 500 MG capsule Take 1 capsule (500 mg total) by mouth 3 (three) times daily. May take with food to minimize GI side effects. 08/31/11  Yes [name], MD  lidocaine-prilocaine (EMLA) cream Apply topically as needed. 08/31/11 08/30/12 Yes [name], MD  warfarin (COUMADIN) 1 MG tablet Take 1 tab nightly for port maintenance. 08/26/11  Yes [name], MD  prednisoLONE sodium phosphate (INFLAMASE FORTE) 1 % ophthalmic solution Place 1 drop into both eyes 2 (two) times daily. 08/31/11 09/10/11  [name], MD    Allergies:  Allergies  Allergen Reactions  . Morphine And Related Hives    Social History:   reports that he has been smoking.  He does not have any smokeless tobacco history on file. He reports that he does not drink alcohol or use illicit drugs. He denies smoking to me, but does endorse occasional "reefer" but denies alcohol. He has a wife who also has sickle cell disease, and has two children by a different woman, one of whom has sickle cell disease and the other has the trait. His mother also has sickle cell.   Family History: No family history on  file.  Physical Exam: Filed Vitals:   10/03/11 1734 10/03/11 1924  BP: 124/66 118/44  Pulse: 91 76  Temp: 99.3 F (37.4 C) 99 F (37.2 C)  TempSrc: Oral Oral  Resp: 18 16  SpO2: 97% 100%   Blood pressure 118/44, pulse 76, temperature 99 F (37.2 C), temperature source Oral, resp. rate 16, SpO2 100.00%. Gen: Young, well dressed, very talkative M in no distress, speech a bit pressured, but he is very  nice, apologetic, pleasant and grateful, appears not toxic. Initially seen to be walking back  from the bathroom without difficulty HEENT: Pupils round and reactive, scleral muddying, and possibly a bit of light icterus. Mouth  moist and  normal appearing, missing some teeth.  Lungs: CTAB no w/c/r, good air movement, normal exam throughout Heart: Regular, not tachycardic, S1/2 heard with an early peaking systolic murmur as well.  Abd: Soft, not tender, not distended, no facial grimacing, benign overall Extrem: Warm, well perfusing, radials moderately well palpated, no BLE edema noted, normal exam Neuro: Alert, attentive, conversant, CN 2-12 intact without slurring, facial asymmetry. Moves  extremities on his own, was seen to ahve normal gait going up the hall, grossly normal exam.  Skin: Lots of various tattoos. Psych: Seen to get a bit tearful during the interview.     Labs & Imaging Results for orders placed during the hospital encounter of 10/03/11 (from the past 48 hour(s))  URINALYSIS, ROUTINE W REFLEX MICROSCOPIC     Status: Normal   Collection Time   10/03/11  7:03 PM      Component Value Range Comment   Color, Urine YELLOW  YELLOW     APPearance CLEAR  CLEAR     Specific Gravity, Urine 1.009  1.005 - 1.030     pH 6.0  5.0 - 8.0     Glucose, UA NEGATIVE  NEGATIVE (mg/dL)    Hgb urine dipstick NEGATIVE  NEGATIVE     Bilirubin Urine NEGATIVE  NEGATIVE     Ketones, ur NEGATIVE  NEGATIVE (mg/dL)    Protein, ur NEGATIVE  NEGATIVE (mg/dL)    Urobilinogen, UA 1.0  0.0 - 1.0 (mg/dL)    Nitrite NEGATIVE  NEGATIVE     Leukocytes, UA NEGATIVE  NEGATIVE  MICROSCOPIC NOT DONE ON URINES WITH NEGATIVE PROTEIN, BLOOD, LEUKOCYTES, NITRITE, OR GLUCOSE <1000 mg/dL.  CBC     Status: Abnormal   Collection Time   10/03/11  9:00 PM      Component Value Range Comment   WBC 16.2 (*) 4.0 - 10.5 (K/uL)    RBC 2.83 (*) 4.22 - 5.81 (MIL/uL)    Hemoglobin 9.0 (*) 13.0 - 17.0 (g/dL)    HCT 91.4 (*) 78.2 - 52.0 (%)    MCV 91.9  78.0 - 100.0 (fL)    MCH 31.8  26.0 - 34.0 (pg)    MCHC 34.6  30.0 - 36.0 (g/dL)    RDW 95.6 (*) 21.3 - 15.5 (%)    Platelets 271  150 - 400 (K/uL)   DIFFERENTIAL     Status: Abnormal   Collection Time    10/03/11  9:00 PM      Component Value Range Comment   Neutrophils Relative 76  43 - 77 (%)    Neutro Abs 12.3 (*) 1.7 - 7.7 (K/uL)    Lymphocytes Relative 15  12 - 46 (%)    Lymphs Abs 2.4  0.7 - 4.0 (K/uL)    Monocytes Relative 7  3 - 12 (%)  Monocytes Absolute 1.1 (*) 0.1 - 1.0 (K/uL)    Eosinophils Relative 2  0 - 5 (%)    Eosinophils Absolute 0.4  0.0 - 0.7 (K/uL)    Basophils Relative 0  0 - 1 (%)    Basophils Absolute 0.1  0.0 - 0.1 (K/uL)   COMPREHENSIVE METABOLIC PANEL     Status: Abnormal   Collection Time   10/03/11  9:00 PM      Component Value Range Comment   Sodium 135  135 - 145 (mEq/L)    Potassium 4.1  3.5 - 5.1 (mEq/L)    Chloride 101  96 - 112 (mEq/L)    CO2 28  19 - 32 (mEq/L)    Glucose, Bld 115 (*) 70 - 99 (mg/dL)    BUN 8  6 - 23 (mg/dL)    Creatinine, Ser 4.54  0.50 - 1.35 (mg/dL)    Calcium 9.4  8.4 - 10.5 (mg/dL)    Total Protein 8.0  6.0 - 8.3 (g/dL)    Albumin 3.7  3.5 - 5.2 (g/dL)    AST 37  0 - 37 (U/L)    ALT 31  0 - 53 (U/L)    Alkaline Phosphatase 104  39 - 117 (U/L)    Total Bilirubin 3.8 (*) 0.3 - 1.2 (mg/dL)    GFR calc non Af Amer >90  >90 (mL/min)    GFR calc Af Amer >90  >90 (mL/min)   RETICULOCYTES     Status: Abnormal   Collection Time   10/03/11  9:00 PM      Component Value Range Comment   Retic Ct Pct 13.4 (*) 0.4 - 3.1 (%)    RBC. 2.83 (*) 4.22 - 5.81 (MIL/uL)    Retic Count, Manual 379.2 (*) 19.0 - 186.0 (K/uL)   PROTIME-INR     Status: Normal   Collection Time   10/03/11  9:00 PM      Component Value Range Comment   Prothrombin Time 14.7  11.6 - 15.2 (seconds)    INR 1.13  0.00 - 1.49     Dg Chest 2 View  10/04/2011  *RADIOLOGY REPORT*  Clinical Data: Sickle cell crisis.  Pain.  Leukocytosis.  CHEST - 2 VIEW  Comparison: 03/15/2011  Findings: Interval placement of a power port type right central venous catheter with tip over the low SVC region.  Mild cardiac enlargement with mild increased pulmonary vascularity, stable since  previous study.  No focal airspace consolidation in the lungs.  No blunting of costophrenic angles.  No pneumothorax.  IMPRESSION: Stable cardiac enlargement and pulmonary vascular prominence. Interval placement of power port central venous catheter with tip over the low SVC region.  No acute consolidation.  Original Report Authenticated By: [name], M.D.    Impression Present on Admission:  .Hemoglobin S-S disease .Sickle cell pain crisis .Leukocytosis .Hyperbilirubinemia  35yoM with h/o sickle cell disease presents with sickle cell pain crisis.   1. Sickle cell pain crisis: This appears uncomplicated, and involving his lower back. Hgb appears  stable and retic count is robust. CXR without acute process. He states he take Dilaudid 4mg  at  home, possibly 6-7 a day for pain control, and this dose is also documented in last admission in  03/2011. Therefore, although I don't like giving 4mg  Dilaudid boluses at a time, it's reasonable  for him given his likely high tolerance.   - Admit observation for pain control. Continue home PO Dilaudid at 4mg  PO q6  scheduled and use   Dilaudid 4mg  IV q6 PRN.  - Continue home hydrea, folate, coumadin for port patency, exjade.  - IVF's maintenance, oxygen  2. 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 Leukocytosis: Suspect this is due to stress reaction with sickle cell disease. Nothing at  current to suggest infection and this is commonly seen with sickle cell crises. Would just  monitor for now, not going to give ABx.   DVT prophy: Pt is fully ambulatory  Regular bed, [facility] team 5 Presumed full code  Other plans as per orders.  Jalen Daluz 10/04/2011, 2:45 AM

## 2011-10-04 NOTE — Progress Notes (Signed)
Patient is lethargic but alert, medicated for pain with dilaudid 4 mg five times today, vital signs are stable, tolerating diet, patient walking with CNA but stayed off floor for about an hour smoking, benadryl given for itching and zofran given for nausea, ativan given earlier this morning, continue to monitor Means, Myrtie Hawk RN 10-04-11 16:36pm

## 2011-10-04 NOTE — Progress Notes (Signed)
Report given to RN Misty Stanley on [address] Myrtie Hawk [place] 10-04-11 19:50pm

## 2011-10-04 NOTE — Progress Notes (Signed)
Spoke with MD Betti Cruz concerning patient stating that his pain is not being relieved with his current pain regimen and wants to know if there are any other options, orders received for one time order of dilaudid 3mg  IV once now and MD will round and speak with patient Means, Myrtie Hawk RN 10-04-11 7:50am

## 2011-10-05 DIAGNOSIS — D5700 Hb-SS disease with crisis, unspecified: Principal | ICD-10-CM

## 2011-10-05 DIAGNOSIS — R52 Pain, unspecified: Secondary | ICD-10-CM

## 2011-10-05 LAB — BASIC METABOLIC PANEL
BUN: 12 mg/dL (ref 6–23)
CO2: 29 mEq/L (ref 19–32)
Calcium: 9.2 mg/dL (ref 8.4–10.5)
Creatinine, Ser: 0.54 mg/dL (ref 0.50–1.35)
GFR calc non Af Amer: >90 mL/min (ref >90)
Glucose, Bld: 97 mg/dL (ref 70–99)
Sodium: 136 mEq/L (ref 135–145)

## 2011-10-05 LAB — PROTIME-INR: Prothrombin Time: 14.6 seconds (ref 11.6–15.2)

## 2011-10-05 LAB — CBC
Hemoglobin: 8 g/dL — ABNORMAL LOW (ref 13.0–17.0)
MCH: 31.6 pg (ref 26.0–34.0)
MCHC: 34.6 g/dL (ref 30.0–36.0)
MCV: 91.3 fL (ref 78.0–100.0)
RBC: 2.53 MIL/uL — ABNORMAL LOW (ref 4.22–5.81)

## 2011-10-05 LAB — PREPARE RBC (CROSSMATCH)

## 2011-10-05 MED ORDER — HYDROMORPHONE HCL PF 2 MG/ML IJ SOLN
4.0000 mg | Freq: Once | INTRAMUSCULAR | Status: AC
  Administered 2011-10-05: 4 mg via INTRAVENOUS
  Filled 2011-10-05: qty 2

## 2011-10-05 MED ORDER — ACETAMINOPHEN 325 MG PO TABS
650.0000 mg | ORAL_TABLET | Freq: Once | ORAL | Status: AC
  Administered 2011-10-05: 650 mg via ORAL
  Filled 2011-10-05: qty 2

## 2011-10-05 MED ORDER — FUROSEMIDE 10 MG/ML IJ SOLN
20.0000 mg | Freq: Once | INTRAMUSCULAR | Status: AC
  Administered 2011-10-05: 20 mg via INTRAVENOUS
  Filled 2011-10-05: qty 2

## 2011-10-05 MED ORDER — DEXTROSE 5 % IV SOLN
2000.0000 mg | Freq: Two times a day (BID) | INTRAVENOUS | Status: DC
  Administered 2011-10-06 – 2011-10-07 (×4): 2000 mg via INTRAVENOUS
  Filled 2011-10-05 (×7): qty 2

## 2011-10-05 MED ORDER — SODIUM CHLORIDE 0.9 % IV SOLN
INTRAVENOUS | Status: DC
  Administered 2011-10-05 – 2011-10-07 (×2): via INTRAVENOUS

## 2011-10-05 MED ORDER — ALTEPLASE 100 MG IV SOLR
2.0000 mg | Freq: Once | INTRAVENOUS | Status: DC
  Filled 2011-10-05: qty 2

## 2011-10-05 MED ORDER — SODIUM CHLORIDE 0.9 % IJ SOLN
10.0000 mL | INTRAMUSCULAR | Status: DC | PRN
  Administered 2011-10-05: 180 mL

## 2011-10-05 MED ORDER — DIPHENHYDRAMINE HCL 25 MG PO CAPS
25.0000 mg | ORAL_CAPSULE | Freq: Once | ORAL | Status: AC
  Administered 2011-10-05: 25 mg via ORAL
  Filled 2011-10-05: qty 1

## 2011-10-05 NOTE — Progress Notes (Signed)
Per MD order, blood exchanged done for 750cc of blood. NS 10 cc flushes used (18 in total). Pt tolerated procedure well. Fred RN notified that the exchange is completed.  Consuello Masse

## 2011-10-05 NOTE — Progress Notes (Signed)
Visited this afternoon with patient.  Re-iterated the hospital policy regarding smoking on campus, that this is not allowed under any circumstances.  I also explained to the patient that although he had an order to leave the department and walk, his nurse, in his judgment may not deem that this would be appropriate based on the patients' level of alertness.  Patient verbalized an understanding of this conversation.

## 2011-10-05 NOTE — Progress Notes (Signed)
REFERRING PHYSICIAN:  Andreas Blower, MD  REASON FOR CONSULTATION:  Hemoglobin SS disease/crisis.  HISTORY OF PRESENT ILLNESS:  Christopher Warren is well known to me.  A 36- year-old black gentleman with hemoglobin SS disease.  He does have iron overload.  He is on Exjade.  I am sure if he is taking this or not.  He chronically runs a hemoglobin of about 7 or 8.  He is on Dilaudid for pain control.  He had another crisis.  He was admitted, I think on the 23rd.  Not surprisingly, the hospitalist would like to transfer him over to the oncology service.  As such, we will take over.  He has been started on exchanges.  We finally got a Port-A-Cath into him.  He has pain in his back.  He has some shoulder pain.  He has had no fever.  When he came into the hospital, his CBC showed a white cell count of 16, hemoglobin 9, hematocrit 26, platelet count 271.  I think his ferritin was 4000.  His total bilirubin was 3.8.  BUN and creatinine were okay. Potassium 4.1.  His reticulocyte count was 13%.  He was started on IV fluids.  We are asked to see him and take over his care.  PAST MEDICAL HISTORY: 1. Remarkable for the hemoglobin SS disease. 2. Iron overload. 3. History of asthma.  ALLERGIES:  "Morphine".  MEDICATIONS:  Exjade 250 mg p.o. daily, folic acid 1 mg p.o. daily, Dilaudid 4-8 mg q.6 hours p.r.n., Hydrea 500 mg p.o. t.i.d., Coumadin 1 mg p.o. daily.  SOCIAL HISTORY:  Remarkable for tobacco use.  He states no alcohol use. There is some occasional marijuana use.  FAMILY HISTORY:  Remarkable for sickle cell.  PHYSICAL EXAM:  This is a well-developed, well-nourished, black gentleman in no obvious distress.  Vital signs:  99, pulse 73, respiratory rate 16, blood pressure 117/69, oximetry saturation 92%. Head/Neck:  A normocephalic, atraumatic skull.  He has no scleral icterus.  There are no ocular or oral lesions.  There is no palpable cervical or supraclavicular lymph nodes.   Lungs:  Clear bilaterally.  No rales, wheezes, rhonchi are noted.  Cardiac:  Regular rate and rhythm with a normal S1, S2.  There are no murmurs, rubs, or bruits.  Abdomen: Soft, good bowel sounds.  There is no palpable abdominal mass.  There is no fluid wave.  There is no palpable hepatosplenomegaly.  Extremities: No clubbing, cyanosis, or edema.  Neurological:  No focal neurological deficits.  LABORATORY STUDIES:  White cell count is 20, hemoglobin 8, hematocrit 23, platelet count 245.  Sodium 136, potassium 4.1, BUN 12, creatinine 0.54.  IMPRESSION:  Christopher Warren is a 36 year old black gentleman with sickle cell disease.  He has another crisis.  Usually he comes in the hospital every 3 or 4 months.  Will go ahead and do a "mini" exchange on him.  This usually does help.  Will probably do this for the next couple of days.  Will give the Desferal IV.  Again I am not sure if he even takes the Exjade.  Will continue to use the Dilaudid for pain control.  So far, cultures have not needed to be taken.  He has been afebrile.  Will continue IV fluids.  Will continue his folic acid.    ______________________________ Christopher Warren, M.D. PRE/MEDQ  D:  10/05/2011  T:  10/05/2011  Job:  [id number]

## 2011-10-05 NOTE — Progress Notes (Signed)
Pt has been calm and cooperative for the remainder of the shift. No attempts were made to leave the floor.

## 2011-10-06 ENCOUNTER — Other Ambulatory Visit: Payer: Self-pay | Admitting: Hematology & Oncology

## 2011-10-06 LAB — CBC
HCT: 27.9 % — ABNORMAL LOW (ref 39.0–52.0)
Hemoglobin: 9.8 g/dL — ABNORMAL LOW (ref 13.0–17.0)
MCH: 30.8 pg (ref 26.0–34.0)
MCHC: 35.1 g/dL (ref 30.0–36.0)
MCV: 87.7 fL (ref 78.0–100.0)
RDW: 18.9 % — ABNORMAL HIGH (ref 11.5–15.5)

## 2011-10-06 MED ORDER — PROMETHAZINE HCL 25 MG/ML IJ SOLN
12.5000 mg | INTRAMUSCULAR | Status: DC | PRN
  Administered 2011-10-06 – 2011-10-07 (×5): 12.5 mg via INTRAVENOUS
  Filled 2011-10-06 (×5): qty 1

## 2011-10-06 MED ORDER — CEPHALEXIN 500 MG PO CAPS
500.0000 mg | ORAL_CAPSULE | Freq: Four times a day (QID) | ORAL | Status: DC
  Administered 2011-10-06 – 2011-10-07 (×7): 500 mg via ORAL
  Filled 2011-10-06 (×9): qty 1

## 2011-10-06 MED ORDER — NICOTINE 21 MG/24HR TD PT24: 1.0000 | MEDICATED_PATCH | Freq: Every day | TRANSDERMAL | Status: AC

## 2011-10-06 MED ORDER — ACETAMINOPHEN 325 MG PO TABS: 650.0000 mg | ORAL_TABLET | Freq: Once | ORAL | Status: DC

## 2011-10-06 MED ORDER — DIPHENHYDRAMINE HCL 50 MG/ML IJ SOLN
12.5000 mg | INTRAMUSCULAR | Status: DC | PRN
  Administered 2011-10-06 – 2011-10-07 (×7): 12.5 mg via INTRAVENOUS
  Filled 2011-10-06 (×8): qty 1

## 2011-10-06 MED ORDER — ACETAMINOPHEN 325 MG PO TABS
650.0000 mg | ORAL_TABLET | Freq: Once | ORAL | Status: AC
  Administered 2011-10-06: 650 mg via ORAL
  Filled 2011-10-06: qty 2

## 2011-10-06 MED ORDER — NICOTINE 21 MG/24HR TD PT24
21.0000 mg | MEDICATED_PATCH | Freq: Every day | TRANSDERMAL | Status: DC
  Administered 2011-10-06: 21 mg via TRANSDERMAL
  Filled 2011-10-06 (×2): qty 1

## 2011-10-06 MED ORDER — CEPHALEXIN 500 MG PO CAPS: 500.0000 mg | ORAL_CAPSULE | Freq: Four times a day (QID) | ORAL | Status: DC

## 2011-10-06 NOTE — Discharge Summary (Signed)
#   [id number] is the d/c summary.

## 2011-10-06 NOTE — Progress Notes (Signed)
Mr. Sayre is doing a little bit better.  He got up phlebotomized 750 mL of blood yesterday.  He got 2 units of blood back.  Will go ahead and do another exchange on him today.  There ares some issues with him trying to go off the floor.  I talked with him about that today.  I explained to him the hospital policy about having to have somebody with him.  He does understand this.  Will try a nicotine patch on him.  This might help with him wanting to smoke.  He has had no fever.  He has had no cough.  There is no lab work back yet today.  His appetite is doing alright.  There is no nausea or vomiting.  He still has some pain in the back.  He says he just "aches."  PHYSICAL EXAM:  There are pretty much no changes from yesterday.  His temperature is 99.2, pulse 67, blood pressure is 102/62.  His lungs are clear.  Cardiac:  Regular rate and rhythm with normal S1, S2.  There are no murmurs, rubs, or bruits.  Abdomen:  Soft with good bowel sounds. There is no palpable abdominal mass.  There is no fluid wave.  No palpable hepatosplenomegaly.  Extremities:  No clubbing, cyanosis, or edema.  Will do another exchange on him today.  He will continue on his Desferal.  I forgot to mention that there is maybe a little sebaceous cyst under the right arm.  This does not appear to be infected.  I will give him some Keflex for this.  I will put on nicotine patch.  Hopefully this might help with his nicotine craving.    ______________________________ [name], M.D. PRE/MEDQ  D:  10/06/2011  T:  10/06/2011  Job:  [id number]

## 2011-10-06 NOTE — Progress Notes (Signed)
Blood exchange/transfusion ordered. Was able to draw off approximately 500cc of blood with flushing with 10cc ns between each draw off right chest port. When completed flushed with 10cc ns and resumed fluids at same rate. Pt. Tolerated procedure well. Colbert Ewing RN VAST.

## 2011-10-07 DIAGNOSIS — L738 Other specified follicular disorders: Secondary | ICD-10-CM

## 2011-10-07 LAB — CBC
Hemoglobin: 9.4 g/dL — ABNORMAL LOW (ref 13.0–17.0)
MCH: 30.8 pg (ref 26.0–34.0)
MCV: 87.5 fL (ref 78.0–100.0)
RBC: 3.05 MIL/uL — ABNORMAL LOW (ref 4.22–5.81)
WBC: 15.4 10*3/uL — ABNORMAL HIGH (ref 4.0–10.5)

## 2011-10-07 LAB — RETICULOCYTES
Retic Count, Absolute: 140.3 10*3/uL (ref 19.0–186.0)
Retic Ct Pct: 4.6 % — ABNORMAL HIGH (ref 0.4–3.1)

## 2011-10-07 MED ORDER — SODIUM CHLORIDE 0.9 % IJ SOLN: 10.0000 mL | INTRAMUSCULAR | Status: DC | PRN

## 2011-10-07 MED ORDER — HEPARIN SOD (PORK) LOCK FLUSH 100 UNIT/ML IV SOLN: 500.0000 [IU] | INTRAVENOUS | Status: DC | PRN

## 2011-10-07 MED ORDER — HEPARIN SOD (PORK) LOCK FLUSH 100 UNIT/ML IV SOLN: 500.0000 [IU] | Freq: Every day | INTRAVENOUS | Status: DC | PRN

## 2011-10-07 MED ORDER — HYDROMORPHONE HCL 4 MG PO TABS: 4.0000 mg | ORAL_TABLET | Freq: Four times a day (QID) | ORAL | Status: DC | PRN

## 2011-10-07 MED ORDER — HYDROXYUREA 500 MG PO CAPS: 500.0000 mg | ORAL_CAPSULE | Freq: Three times a day (TID) | ORAL | Status: AC

## 2011-10-07 MED ORDER — CEPHALEXIN 500 MG PO CAPS: 500.0000 mg | ORAL_CAPSULE | Freq: Four times a day (QID) | ORAL | Status: AC

## 2011-10-07 MED ORDER — HEPARIN SOD (PORK) LOCK FLUSH 100 UNIT/ML IV SOLN
INTRAVENOUS | Status: AC
  Filled 2011-10-07: qty 5

## 2011-10-07 NOTE — Discharge Summary (Signed)
NAME:  Christopher Warren, Christopher Warren NO.:  [id number]  MEDICAL RECORD NO.:  [id number]  LOCATION:  1332                         FACILITY:  [facility]  PHYSICIAN:  [name], M.D.  DATE OF BIRTH:  25-Oct-1975  DATE OF ADMISSION:  10/03/2011 DATE OF DISCHARGE:  10/06/2011                              DISCHARGE SUMMARY   DIAGNOSES UPON DISCHARGE: 1. Hemoglobin SS crisis. 2. 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 3. Right axillary folliculitis. 4. History of asthma.  CONDITION ON DISCHARGE:  Fair.  ACTIVITIES:  As tolerated.  FOLLOWUP:  The patient will come to the [facility] on October 07, 2011, for other exchange transfusion.  MEDICATION UPON DISCHARGE: 1. Folic acid 1 mg p.o. daily. 2. Hydroxyurea 500 mg p.o. t.i.d. 3. NicoDerm patch 21 mg patch to the skin daily. 4. Exjade 125 mg daily. 5. Dilaudid 4 mg q.6 hours p.r.n. 6. Coumadin 1 mg p.o. daily. 7. Prednisolone eyedrops 1 drop both eyes b.i.d.  HOSPITAL COURSE:  Mr. Pina was admitted because of sickle crisis. He is a 36 year old white gentleman with hemoglobin SS disease.  He had a Port-A-Cath placed and we could do exchange transfusions on him.  He actually has been out of the hospital for a quite a while.  He was admitted.  His hemoglobin usually runs about 8 or 9.  His hemoglobin was a little bit on the lower side.  He had a chest x-ray done, which did not show any evidence of obvious pneumonia.  He was admitted to [facility] [address].  His electrolytes have done showed good BUN and creatinine.  He did get a couple of exchange transfusion in the hospital.  This did seem to help him a little bit.  I felt that he could probably benefit from further exchanges as an outpatient.  We will get this set up in the office.  He had problems with not being able to go outside and smoke.  I talked him about the hospital policy.  He was not aware of this and once we talked him, this really not  become an issue.  We did put him on a nicotine patch.  He had no problems with his appetite.  No nausea or vomiting.  He wanted to go home on October 06, 2011.  I felt we probably could get him home after an exchange.  PHYSICAL EXAMINATION:  GENERAL:  On his physical exam, this is a well- developed, well-nourished black gentleman, in no obvious distress. VITAL SIGNS:  Show temperature of 98, pulse 73, respiratory rate 18, and blood pressure 103/61.  His oxygen saturation on room air is 94%. HEAD AND NECK EXAM:  Shows no ocular or oral lesions.  There is no palpable thyroid.  There is no adenopathy in the neck. LUNGS:  Clear bilaterally.  He may have a little bit of expiratory wheezes. CARDIAC EXAMINATION:  Regular rate and rhythm with no murmurs, rubs, or bruits. ABDOMINAL EXAM:  Soft with good bowel sounds.  There is no palpable abdominal mass.  There is no fluid wave.  There is no palpable hepatosplenomegaly. EXTREMITIES:  Show no clubbing, cyanosis, or edema.  There is some slight tenderness over his long bones.  Back exam shows some tenderness in the lumbar spine. NEUROLOGICAL EXAM:  No focal neurological deficits.  LABORATORY DATA:  His laboratory studies upon discharge show white cell count 18, hemoglobin 9.8, hematocrit 27.9, and platelet count 249.  He does have some folliculitis in the right axilla.  As such, I have put him on Keflex to try to help with this.  Again, he will come to the office on October 07, 2011, and we will do an exchange transfusion in the office.     [name], M.D.     PRE/MEDQ  D:  10/06/2011  T:  10/07/2011  Job:  [id number]

## 2011-10-07 NOTE — Progress Notes (Signed)
Patient discharged at 28 with his mother via wheelchair.  Allayne Butcher [facility] 10/07/2011 7:43 PM

## 2011-10-07 NOTE — Progress Notes (Signed)
Patient given discharge instructions and he verbalized understanding.  Patient understands pain management and follow up with Dr. Myna Hidalgo.  Patient understands when he would need to call MD.  Patient stable for discharge.  Allayne Butcher [facility]  10/07/2011  6:24 PM

## 2011-10-07 NOTE — Progress Notes (Signed)
IV nurse phlebotomized 500 cc of blood from patient's PAC. Patient then received one unit PRBC's and he tolerated the transfusion without any problems.  Allayne Butcher [facility]  10/07/2011

## 2011-10-07 NOTE — Discharge Summary (Signed)
Note didtated

## 2011-10-07 NOTE — Plan of Care (Signed)
Problem: Phase III Progression Outcomes Goal: Pain controlled on oral analgesia Outcome: Not Met (add Reason) Patient taking IV pain medication until discharged.  No orders to titrate IV meds or to change to po meds.

## 2011-10-09 LAB — TYPE AND SCREEN
Antibody Screen: NEGATIVE
Unit division: 0
Unit division: 0
Unit division: 0

## 2011-10-14 ENCOUNTER — Encounter (HOSPITAL_COMMUNITY)
Admission: RE | Admit: 2011-10-14 | Discharge: 2011-10-14 | Disposition: A | Discharge status: Home / Self Care | Payer: Medicaid Other | Source: Ambulatory Visit | Attending: Hematology & Oncology | Admitting: Hematology & Oncology

## 2011-10-14 DIAGNOSIS — D571 Sickle-cell disease without crisis: Secondary | ICD-10-CM

## 2011-10-20 ENCOUNTER — Ambulatory Visit: Payer: Medicaid Other

## 2011-10-20 ENCOUNTER — Telehealth: Payer: Self-pay | Admitting: *Deleted

## 2011-10-20 NOTE — Telephone Encounter (Signed)
Patient called complaining of increased pain over the past few days.  Was up all night last night hurting.  Has a bump in his ear he would like for one of [place] to look at.  Told to Dr.Ennever.  Dr E stated he could come in for fluids but pain medicine administration will have to be assessed.  Patient states he will see about getting a ride in to [facility].  appt made with scheduler

## 2011-10-21 ENCOUNTER — Emergency Department (HOSPITAL_COMMUNITY)
Admission: EM | Admit: 2011-10-21 | Discharge: 2011-10-21 | Disposition: A | Discharge status: Home / Self Care | Payer: Medicaid Other | Attending: Emergency Medicine | Admitting: Emergency Medicine

## 2011-10-21 ENCOUNTER — Ambulatory Visit: Payer: Medicaid Other

## 2011-10-21 ENCOUNTER — Encounter (HOSPITAL_COMMUNITY): Payer: Self-pay | Admitting: Family Medicine

## 2011-10-21 DIAGNOSIS — D5700 Hb-SS disease with crisis, unspecified: Secondary | ICD-10-CM | POA: Insufficient documentation

## 2011-10-21 NOTE — ED Notes (Signed)
Pt and friend left the lobby and went out of dept

## 2011-10-21 NOTE — ED Notes (Signed)
Patient states he started having sickle cell crisis 2 days ago. Pain in lower back.

## 2011-11-01 ENCOUNTER — Other Ambulatory Visit: Payer: Self-pay | Admitting: Hematology & Oncology

## 2011-11-01 DIAGNOSIS — D571 Sickle-cell disease without crisis: Secondary | ICD-10-CM

## 2011-11-01 MED ORDER — HYDROMORPHONE HCL 4 MG PO TABS: 4.0000 mg | ORAL_TABLET | Freq: Four times a day (QID) | ORAL | Status: DC | PRN

## 2011-11-10 ENCOUNTER — Other Ambulatory Visit: Payer: Self-pay | Admitting: *Deleted

## 2011-11-10 DIAGNOSIS — D571 Sickle-cell disease without crisis: Secondary | ICD-10-CM

## 2011-11-10 NOTE — Progress Notes (Signed)
Pt called stating that he needs to come in tomorrow to have his labs checked because he is "feeling real bad & thinks he is going to need a transfusion". Scheduled pt to come in for lab at 0845 with possible exchange transfusion afterwards. He understands that he can not be late for his appt. Christopher Warren wanted to come "real early" but explained to him that other pt's were scheduled before then and that was the 1st opening. He knows that will only be taken ahead of time if time permits otherwise it will be as scheduled.

## 2011-11-11 ENCOUNTER — Emergency Department (HOSPITAL_COMMUNITY)
Admission: EM | Admit: 2011-11-11 | Discharge: 2011-11-11 | Discharge status: Against Medical Advice | Payer: Medicaid Other | Source: Home / Self Care | Attending: Emergency Medicine | Admitting: Emergency Medicine

## 2011-11-11 ENCOUNTER — Ambulatory Visit (HOSPITAL_BASED_OUTPATIENT_CLINIC_OR_DEPARTMENT_OTHER): Payer: Medicaid Other

## 2011-11-11 ENCOUNTER — Encounter (HOSPITAL_COMMUNITY): Payer: Self-pay | Admitting: Emergency Medicine

## 2011-11-11 ENCOUNTER — Emergency Department (HOSPITAL_COMMUNITY): Payer: Medicaid Other

## 2011-11-11 ENCOUNTER — Ambulatory Visit (HOSPITAL_BASED_OUTPATIENT_CLINIC_OR_DEPARTMENT_OTHER): Payer: Medicaid Other | Admitting: Lab

## 2011-11-11 VITALS — BP 95/50 | HR 69 | Temp 98.0°F | Resp 20 | Ht 65.0 in | Wt 130.0 lb

## 2011-11-11 DIAGNOSIS — D72829 Elevated white blood cell count, unspecified: Secondary | ICD-10-CM | POA: Diagnosis present

## 2011-11-11 DIAGNOSIS — R17 Unspecified jaundice: Secondary | ICD-10-CM | POA: Diagnosis present

## 2011-11-11 DIAGNOSIS — R509 Fever, unspecified: Secondary | ICD-10-CM | POA: Diagnosis present

## 2011-11-11 DIAGNOSIS — D571 Sickle-cell disease without crisis: Secondary | ICD-10-CM | POA: Insufficient documentation

## 2011-11-11 DIAGNOSIS — M545 Low back pain, unspecified: Secondary | ICD-10-CM | POA: Diagnosis present

## 2011-11-11 DIAGNOSIS — J45909 Unspecified asthma, uncomplicated: Secondary | ICD-10-CM | POA: Diagnosis present

## 2011-11-11 DIAGNOSIS — R062 Wheezing: Secondary | ICD-10-CM

## 2011-11-11 DIAGNOSIS — D5700 Hb-SS disease with crisis, unspecified: Principal | ICD-10-CM | POA: Diagnosis present

## 2011-11-11 DIAGNOSIS — R52 Pain, unspecified: Secondary | ICD-10-CM | POA: Insufficient documentation

## 2011-11-11 DIAGNOSIS — F17200 Nicotine dependence, unspecified, uncomplicated: Secondary | ICD-10-CM | POA: Diagnosis present

## 2011-11-11 LAB — CBC WITH DIFFERENTIAL (CANCER CENTER ONLY)
BASO#: 0.1 10*3/uL (ref 0.0–0.2)
Eosinophils Absolute: 1.1 10*3/uL — ABNORMAL HIGH (ref 0.0–0.5)
HGB: 7.1 g/dL — ABNORMAL LOW (ref 13.0–17.1)
MCH: 33.8 pg — ABNORMAL HIGH (ref 28.0–33.4)
MONO#: 1.7 10*3/uL — ABNORMAL HIGH (ref 0.1–0.9)
MONO%: 8.4 % (ref 0.0–13.0)
NEUT#: 13.3 10*3/uL — ABNORMAL HIGH (ref 1.5–6.5)
Platelets: 358 10*3/uL (ref 145–400)
RBC: 2.1 10*6/uL — ABNORMAL LOW (ref 4.20–5.70)
WBC: 20.6 10*3/uL — ABNORMAL HIGH (ref 4.0–10.0)

## 2011-11-11 LAB — TECHNOLOGIST REVIEW CHCC SATELLITE

## 2011-11-11 LAB — PREPARE RBC (CROSSMATCH)

## 2011-11-11 MED ORDER — SODIUM CHLORIDE 0.9 % IV SOLN: Freq: Once | INTRAVENOUS | Status: DC

## 2011-11-11 MED ORDER — PREDNISOLONE SODIUM PHOSPHATE 1 % OP SOLN: 1.0000 [drp] | Freq: Four times a day (QID) | OPHTHALMIC | Status: DC

## 2011-11-11 MED ORDER — HYDROMORPHONE HCL PF 4 MG/ML IJ SOLN
4.0000 mg | Freq: Once | INTRAMUSCULAR | Status: AC
  Administered 2011-11-11: 4 mg via INTRAVENOUS

## 2011-11-11 MED ORDER — PROMETHAZINE HCL 25 MG/ML IJ SOLN
12.5000 mg | Freq: Four times a day (QID) | INTRAMUSCULAR | Status: DC | PRN
  Administered 2011-11-11: 12.5 mg via INTRAVENOUS

## 2011-11-11 MED ORDER — SODIUM CHLORIDE 0.9 % IJ SOLN
10.0000 mL | INTRAMUSCULAR | Status: AC | PRN
  Administered 2011-11-11: 10 mL
  Filled 2011-11-11: qty 10

## 2011-11-11 MED ORDER — PROMETHAZINE HCL 25 MG PO TABS: 12.5000 mg | ORAL_TABLET | Freq: Four times a day (QID) | ORAL | Status: DC | PRN

## 2011-11-11 MED ORDER — ACETAMINOPHEN 325 MG PO TABS
650.0000 mg | ORAL_TABLET | Freq: Once | ORAL | Status: AC
  Administered 2011-11-11: 650 mg via ORAL

## 2011-11-11 MED ORDER — SODIUM CHLORIDE 0.9 % IV BOLUS (SEPSIS): 1000.0000 mL | Freq: Once | INTRAVENOUS | Status: DC

## 2011-11-11 MED ORDER — PROMETHAZINE HCL 25 MG/ML IJ SOLN
12.5000 mg | Freq: Once | INTRAMUSCULAR | Status: AC
  Administered 2011-11-11: 12.5 mg via INTRAVENOUS

## 2011-11-11 MED ORDER — SODIUM CHLORIDE 0.9 % IV SOLN
250.0000 mL | Freq: Once | INTRAVENOUS | Status: AC
  Administered 2011-11-11: 250 mL via INTRAVENOUS

## 2011-11-11 MED ORDER — IPRATROPIUM-ALBUTEROL 0.5-2.5 (3) MG/3ML IN SOLN
3.0000 mL | Freq: Once | RESPIRATORY_TRACT | Status: AC
Start: 2011-11-11 — End: 2011-11-11
  Administered 2011-11-11: 3 mL via RESPIRATORY_TRACT
  Filled 2011-11-11: qty 3

## 2011-11-11 MED ORDER — DIPHENHYDRAMINE HCL 25 MG PO CAPS
25.0000 mg | ORAL_CAPSULE | Freq: Once | ORAL | Status: AC
  Administered 2011-11-11: 25 mg via ORAL

## 2011-11-11 MED ORDER — HEPARIN SOD (PORK) LOCK FLUSH 100 UNIT/ML IV SOLN
500.0000 [IU] | Freq: Every day | INTRAVENOUS | Status: AC | PRN
  Administered 2011-11-11: 500 [IU]
  Filled 2011-11-11: qty 5

## 2011-11-11 NOTE — ED Notes (Signed)
Triage RN stated that she saw pt walking out. He told her he was not going to stay.

## 2011-11-11 NOTE — Progress Notes (Signed)
Christopher Warren presents today for phlebotomy per MD orders. Phlebotomy procedure started at 0915 and ended at 56. 480 mls  removed. Patient observed for 30 minutes after procedure without any incident. Patient tolerated procedure well. To get 2 unit PRBSs. [name]   1550 Complain of wheezing, Dr Myna Hidalgo notified, [facility] treatment with Albuterol and Proventil with relief.  [name], [name]

## 2011-11-11 NOTE — ED Notes (Signed)
Pt alert, nad, c/o pain associated with sickle disease, [facility], pt receiving "blood exchange" today, resp even unlabored, skin pwd

## 2011-11-11 NOTE — ED Notes (Signed)
Saw Dr. Myna Hidalgo today and received blood products, started vomiting and SOB after tranfusion.  Now c/o lower back pain with nausea.

## 2011-11-11 NOTE — ED Notes (Signed)
Pt refused to have port accessed by RN until he sees the MD.

## 2011-11-11 NOTE — Patient Instructions (Signed)
Blood Transfusion   A blood transfusion replaces your blood or some of its parts. Blood is replaced when you have lost blood because of surgery, an accident, or for severe blood conditions like anemia.  You can donate blood to be used on yourself if you have a planned surgery. If you lose blood during that surgery, your own blood can be given back to you.  Any blood given to you is checked to make sure it matches your blood type. Your temperature, blood pressure, and heart rate (vital signs) will be checked often.   GET HELP RIGHT AWAY IF:    You feel sick to your stomach (nauseous) or throw up (vomit).   You have watery poop (diarrhea).   You have shortness of breath or trouble breathing.   You have blood in your pee (urine) or have dark colored pee.   You have chest pain or tightness.   Your eyes or skin turn yellow (jaundice).   You have a temperature by mouth above 102 F (38.9 C), not controlled by medicine.   You start to shake and have chills.   You develop a a red rash (hives) or feel itchy.   You develop lightheadedness or feel confused.   You develop back, joint, or muscle pain.   You do not feel hungry (lost appetite).   You feel tired, restless, or nervous.   You develop belly (abdominal) cramps.  Document Released: 08/26/2008 Document Revised: 05/19/2011 Document Reviewed: 08/26/2008  [employer] Patient Information 2012 [employer].

## 2011-11-12 ENCOUNTER — Encounter (HOSPITAL_COMMUNITY)
Admission: RE | Admit: 2011-11-12 | Discharge: 2011-11-12 | Disposition: A | Discharge status: Home / Self Care | Payer: Medicaid Other | Source: Ambulatory Visit | Attending: Hematology & Oncology | Admitting: Hematology & Oncology

## 2011-11-12 ENCOUNTER — Inpatient Hospital Stay (HOSPITAL_BASED_OUTPATIENT_CLINIC_OR_DEPARTMENT_OTHER)
Admission: EM | Admit: 2011-11-12 | Discharge: 2011-11-17 | DRG: 812 | Disposition: A | Discharge status: Home / Self Care | Payer: Medicaid Other | Attending: Internal Medicine | Admitting: Internal Medicine

## 2011-11-12 ENCOUNTER — Emergency Department (HOSPITAL_BASED_OUTPATIENT_CLINIC_OR_DEPARTMENT_OTHER): Payer: Medicaid Other

## 2011-11-12 ENCOUNTER — Encounter (HOSPITAL_BASED_OUTPATIENT_CLINIC_OR_DEPARTMENT_OTHER): Payer: Self-pay | Admitting: *Deleted

## 2011-11-12 DIAGNOSIS — D571 Sickle-cell disease without crisis: Secondary | ICD-10-CM

## 2011-11-12 DIAGNOSIS — R509 Fever, unspecified: Secondary | ICD-10-CM

## 2011-11-12 DIAGNOSIS — D57219 Sickle-cell/Hb-C disease with crisis, unspecified: Secondary | ICD-10-CM

## 2011-11-12 DIAGNOSIS — D7289 Other specified disorders of white blood cells: Secondary | ICD-10-CM

## 2011-11-12 DIAGNOSIS — D72829 Elevated white blood cell count, unspecified: Secondary | ICD-10-CM | POA: Diagnosis present

## 2011-11-12 LAB — BASIC METABOLIC PANEL WITH GFR
BUN: 5 mg/dL — ABNORMAL LOW (ref 6–23)
CO2: 27 meq/L (ref 19–32)
Calcium: 8.9 mg/dL (ref 8.4–10.5)
Chloride: 103 meq/L (ref 96–112)
Creatinine, Ser: 0.6 mg/dL (ref 0.50–1.35)
GFR calc Af Amer: >90 mL/min
GFR calc non Af Amer: >90 mL/min
Glucose, Bld: 130 mg/dL — ABNORMAL HIGH (ref 70–99)
Potassium: 3.6 meq/L (ref 3.5–5.1)
Sodium: 138 meq/L (ref 135–145)

## 2011-11-12 LAB — URINALYSIS, ROUTINE W REFLEX MICROSCOPIC
Bilirubin Urine: NEGATIVE
Glucose, UA: NEGATIVE mg/dL
Hgb urine dipstick: NEGATIVE
Ketones, ur: NEGATIVE mg/dL
Leukocytes, UA: NEGATIVE
Nitrite: NEGATIVE
Protein, ur: NEGATIVE mg/dL
Specific Gravity, Urine: 1.012 (ref 1.005–1.030)
Urobilinogen, UA: 1 mg/dL (ref 0.0–1.0)
pH: 5.5 (ref 5.0–8.0)

## 2011-11-12 LAB — DIFFERENTIAL
Basophils Absolute: 0 10*3/uL (ref 0.0–0.1)
Eosinophils Absolute: 0.4 10*3/uL (ref 0.0–0.7)
Lymphocytes Relative: 16 % (ref 12–46)
Monocytes Absolute: 2.2 10*3/uL — ABNORMAL HIGH (ref 0.1–1.0)
Neutrophils Relative %: 77 % (ref 43–77)

## 2011-11-12 LAB — COMPREHENSIVE METABOLIC PANEL
ALT: 30 U/L (ref 0–53)
Alkaline Phosphatase: 92 U/L (ref 39–117)
CO2: 26 mEq/L (ref 19–32)
Calcium: 8.8 mg/dL (ref 8.4–10.5)
Chloride: 103 mEq/L (ref 96–112)
GFR calc Af Amer: >90 mL/min (ref >90)
GFR calc non Af Amer: >90 mL/min (ref >90)
Glucose, Bld: 113 mg/dL — ABNORMAL HIGH (ref 70–99)
Potassium: 4.1 mEq/L (ref 3.5–5.1)
Sodium: 138 mEq/L (ref 135–145)
Total Bilirubin: 4.3 mg/dL — ABNORMAL HIGH (ref 0.3–1.2)

## 2011-11-12 LAB — CBC
Hemoglobin: 9.1 g/dL — ABNORMAL LOW (ref 13.0–17.0)
MCH: 32.6 pg (ref 26.0–34.0)
MCHC: 35.1 g/dL (ref 30.0–36.0)
Platelets: 318 10*3/uL (ref 150–400)
RBC: 2.73 MIL/uL — ABNORMAL LOW (ref 4.22–5.81)
RDW: 20.3 % — ABNORMAL HIGH (ref 11.5–15.5)
WBC: 48.4 10*3/uL — ABNORMAL HIGH (ref 4.0–10.5)

## 2011-11-12 MED ORDER — HYDROMORPHONE HCL PF 1 MG/ML IJ SOLN
1.0000 mg | INTRAMUSCULAR | Status: DC | PRN
  Administered 2011-11-12 (×3): 1 mg via INTRAVENOUS
  Filled 2011-11-12 (×3): qty 1

## 2011-11-12 MED ORDER — SODIUM CHLORIDE 0.9 % IV SOLN
INTRAVENOUS | Status: DC
  Administered 2011-11-12: 05:00:00 via INTRAVENOUS

## 2011-11-12 MED ORDER — ACETAMINOPHEN 325 MG PO TABS
650.0000 mg | ORAL_TABLET | Freq: Once | ORAL | Status: AC
  Administered 2011-11-12: 650 mg via ORAL
  Filled 2011-11-12: qty 2

## 2011-11-12 MED ORDER — HYDROMORPHONE HCL PF 2 MG/ML IJ SOLN
2.0000 mg | Freq: Once | INTRAMUSCULAR | Status: AC
  Administered 2011-11-12: 2 mg via INTRAVENOUS
  Filled 2011-11-12: qty 1

## 2011-11-12 MED ORDER — ONDANSETRON HCL 4 MG/2ML IJ SOLN
4.0000 mg | Freq: Once | INTRAMUSCULAR | Status: AC
  Administered 2011-11-12: 4 mg via INTRAVENOUS
  Filled 2011-11-12: qty 2

## 2011-11-12 MED ORDER — PREDNISOLONE SODIUM PHOSPHATE 1 % OP SOLN
1.0000 [drp] | Freq: Four times a day (QID) | OPHTHALMIC | Status: DC
  Administered 2011-11-12 – 2011-11-17 (×19): 1 [drp] via OPHTHALMIC
  Filled 2011-11-12 (×2): qty 10

## 2011-11-12 MED ORDER — DEXTROSE 5 % IV SOLN
500.0000 mg | Freq: Every day | INTRAVENOUS | Status: DC
  Administered 2011-11-12 (×2): 500 mg via INTRAVENOUS
  Filled 2011-11-12: qty 500

## 2011-11-12 MED ORDER — AZITHROMYCIN 500 MG IV SOLR
500.0000 mg | Freq: Once | INTRAVENOUS | Status: AC
  Administered 2011-11-12: 500 mg via INTRAVENOUS
  Filled 2011-11-12: qty 500

## 2011-11-12 MED ORDER — DEXTROSE 5 % IV SOLN
1.0000 g | Freq: Once | INTRAVENOUS | Status: AC
  Administered 2011-11-12: 1 g via INTRAVENOUS
  Filled 2011-11-12: qty 10

## 2011-11-12 MED ORDER — FOLIC ACID 1 MG PO TABS
1.0000 mg | ORAL_TABLET | Freq: Every day | ORAL | Status: DC
  Administered 2011-11-12 – 2011-11-17 (×6): 1 mg via ORAL
  Filled 2011-11-12 (×6): qty 1

## 2011-11-12 MED ORDER — SODIUM CHLORIDE 0.9 % IV SOLN
INTRAVENOUS | Status: DC
  Administered 2011-11-12 (×2): via INTRAVENOUS

## 2011-11-12 MED ORDER — LORAZEPAM 1 MG PO TABS
1.0000 mg | ORAL_TABLET | Freq: Four times a day (QID) | ORAL | Status: DC | PRN
  Administered 2011-11-12: 1 mg via ORAL
  Filled 2011-11-12: qty 1

## 2011-11-12 MED ORDER — BENZOCAINE 20 % MT SOLN
OROMUCOSAL | Status: AC
  Administered 2011-11-12: 1
  Filled 2011-11-12: qty 57

## 2011-11-12 MED ORDER — ONDANSETRON HCL 4 MG/2ML IJ SOLN
4.0000 mg | Freq: Four times a day (QID) | INTRAMUSCULAR | Status: DC | PRN
  Administered 2011-11-12 – 2011-11-17 (×19): 4 mg via INTRAVENOUS
  Filled 2011-11-12 (×20): qty 2

## 2011-11-12 MED ORDER — HYDROMORPHONE HCL PF 2 MG/ML IJ SOLN
2.0000 mg | INTRAMUSCULAR | Status: DC | PRN
  Administered 2011-11-12 – 2011-11-15 (×31): 2 mg via INTRAVENOUS
  Filled 2011-11-12 (×34): qty 1

## 2011-11-12 MED ORDER — ACETAMINOPHEN 325 MG PO TABS
650.0000 mg | ORAL_TABLET | Freq: Four times a day (QID) | ORAL | Status: DC | PRN
  Filled 2011-11-12: qty 2

## 2011-11-12 MED ORDER — ACETAMINOPHEN 650 MG RE SUPP: 650.0000 mg | Freq: Four times a day (QID) | RECTAL | Status: DC | PRN

## 2011-11-12 MED ORDER — LORAZEPAM 1 MG PO TABS: 1.0000 mg | ORAL_TABLET | Freq: Three times a day (TID) | ORAL | Status: DC | PRN

## 2011-11-12 MED ORDER — PROMETHAZINE HCL 25 MG/ML IJ SOLN
12.5000 mg | Freq: Four times a day (QID) | INTRAMUSCULAR | Status: DC | PRN
  Administered 2011-11-12: 12.5 mg via INTRAVENOUS
  Administered 2011-11-12: 05:00:00 via INTRAVENOUS
  Administered 2011-11-12 – 2011-11-15 (×10): 12.5 mg via INTRAVENOUS
  Filled 2011-11-12 (×12): qty 1

## 2011-11-12 MED ORDER — DIPHENHYDRAMINE HCL 25 MG PO CAPS
25.0000 mg | ORAL_CAPSULE | Freq: Four times a day (QID) | ORAL | Status: DC | PRN
  Administered 2011-11-12 – 2011-11-13 (×2): 25 mg via ORAL
  Filled 2011-11-12 (×3): qty 1

## 2011-11-12 MED ORDER — DEFERASIROX 125 MG PO TBSO: 250.0000 mg | ORAL_TABLET | Freq: Every day | ORAL | Status: DC

## 2011-11-12 MED ORDER — LORAZEPAM 2 MG/ML IJ SOLN
1.0000 mg | Freq: Four times a day (QID) | INTRAMUSCULAR | Status: DC | PRN
  Administered 2011-11-12: 1 mg via INTRAVENOUS
  Filled 2011-11-12 (×2): qty 1

## 2011-11-12 MED ORDER — ALBUTEROL SULFATE (5 MG/ML) 0.5% IN NEBU: 2.5000 mg | INHALATION_SOLUTION | RESPIRATORY_TRACT | Status: DC | PRN

## 2011-11-12 MED ORDER — SODIUM CHLORIDE 0.9 % IJ SOLN
10.0000 mL | INTRAMUSCULAR | Status: DC | PRN
  Administered 2011-11-12 – 2011-11-15 (×2): 10 mL
  Administered 2011-11-16: 130 mL
  Administered 2011-11-17 (×2): 10 mL

## 2011-11-12 MED ORDER — DIPHENHYDRAMINE HCL 50 MG/ML IJ SOLN
25.0000 mg | Freq: Once | INTRAMUSCULAR | Status: AC
  Administered 2011-11-12: 25 mg via INTRAVENOUS
  Filled 2011-11-12: qty 1

## 2011-11-12 MED ORDER — DEXTROSE 5 % IV SOLN
1.0000 g | Freq: Every day | INTRAVENOUS | Status: DC
  Administered 2011-11-12: 1 g via INTRAVENOUS
  Filled 2011-11-12: qty 10

## 2011-11-12 MED ORDER — SODIUM CHLORIDE 0.45 % IV SOLN
INTRAVENOUS | Status: DC
  Administered 2011-11-12: 1000 mL via INTRAVENOUS
  Administered 2011-11-12 – 2011-11-15 (×5): via INTRAVENOUS
  Administered 2011-11-16: 1000 mL via INTRAVENOUS
  Administered 2011-11-16: 01:00:00 via INTRAVENOUS

## 2011-11-12 MED ORDER — NICOTINE 21 MG/24HR TD PT24
21.0000 mg | MEDICATED_PATCH | Freq: Every day | TRANSDERMAL | Status: DC
  Administered 2011-11-16: 21 mg via TRANSDERMAL
  Filled 2011-11-12 (×6): qty 1

## 2011-11-12 MED ORDER — HYDROXYUREA 500 MG PO CAPS
500.0000 mg | ORAL_CAPSULE | Freq: Three times a day (TID) | ORAL | Status: DC
  Administered 2011-11-12 – 2011-11-17 (×15): 500 mg via ORAL
  Filled 2011-11-12 (×19): qty 1

## 2011-11-12 MED ORDER — ONDANSETRON HCL 4 MG PO TABS: 4.0000 mg | ORAL_TABLET | Freq: Four times a day (QID) | ORAL | Status: DC | PRN

## 2011-11-12 NOTE — Progress Notes (Signed)
Subjective: Patient reports significant pain from his sickle cell crisis, has low back pain and generalized pain.  Wondering if he could go outside, instructed the patient that he can walk in the unit but advised the patient not currently off unit.  Objective: Vital signs in last 24 hours: Filed Vitals:   11/12/11 0252 11/12/11 0403 11/12/11 0529 11/12/11 0617  BP: 130/56 119/51  109/64  Pulse: 90 71  65  Temp: 99 F (37.2 C) 98.4 F (36.9 C)  98 F (36.7 C)  TempSrc: Oral Oral  Oral  Resp: 16 17  18   Height:   5\' 5"  (1.651 m)   Weight:   59 kg (130 lb 1.1 oz)   SpO2: 94% 96%  96%   Weight change:  No intake or output data in the 24 hours ending 11/12/11 1114  Physical Exam: General: Oriented, No acute distress, at times patient was drowsy and was falling asleep and had to be aroused. HEENT: EOMI. Neck: Supple CV: S1 and S2 Lungs: Clear to ascultation bilaterally Abdomen: Soft, Nontender, Nondistended, +bowel sounds. Ext: Good pulses. Trace edema.  Lab Results: Basic Metabolic Panel:  Lab 11/12/11 1610 11/12/11 0040  NA 138 138  K 4.1 3.6  CL 103 103  CO2 26 27  GLUCOSE 113* 130*  BUN 7 5*  CREATININE 0.62 0.60  CALCIUM 8.8 8.9  MG -- --  PHOS -- --   Liver Function Tests:  Lab 11/12/11 0555  AST 34  ALT 30  ALKPHOS 92  BILITOT 4.3*  PROT 7.3  ALBUMIN 3.5   No results found for this basename: LIPASE:5,AMYLASE:5 in the last 168 hours No results found for this basename: AMMONIA:5 in the last 168 hours CBC:  Lab 11/12/11 0555 11/12/11 0040 11/11/11 0847  WBC 37.4* 48.4* 20.6*  NEUTROABS 28.8* -- 13.3*  HGB 8.7* 9.1* 7.1*  HCT 24.8* 24.8* 20.6*  MCV 92.9 90.8 98  PLT 322 318 358 Large platelets present   Cardiac Enzymes: No results found for this basename: CKTOTAL:5,CKMB:5,CKMBINDEX:5,TROPONINI:5 in the last 168 hours BNP (last 3 results) No results found for this basename: PROBNP:3 in the last 8760 hours CBG: No results found for this basename:  GLUCAP:5 in the last 168 hours No results found for this basename: HGBA1C:5 in the last 72 hours Other Labs: No components found with this basename: POCBNP:3 No results found for this basename: DDIMER:2 in the last 168 hours No results found for this basename: CHOL:2,HDL:2,LDLCALC:2,TRIG:2,CHOLHDL:2,LDLDIRECT:2 in the last 168 hours No results found for this basename: TSH,T4TOTAL,FREET3,T3FREE,FREET4,THYROIDAB in the last 168 hours No results found for this basename: VITAMINB12:2,FOLATE:2,FERRITIN:2,TIBC:2,IRON:2,RETICCTPCT:2 in the last 168 hours  Micro Results: No results found for this or any previous visit (from the past 240 hour(s)).  Studies/Results: Dg Chest 2 View  11/12/2011  *RADIOLOGY REPORT*  Clinical Data: Views chest pain.  Sickle cell anemia.  Smoker.  CHEST - 2 VIEW  Comparison: 10/03/2011.  Findings: Stable enlarged cardiac silhouette and right jugular porta-catheter.  The lungs remain mildly hyperexpanded with mild diffuse peribronchial thickening.  Unremarkable bones.  IMPRESSION:  1.  No acute abnormality. 2.  Stable cardiomegaly and mild changes of COPD and chronic bronchitis.  Original Report Authenticated By: Darrol Angel, M.D.    Medications: I have reviewed the patient's current medications. Scheduled Meds:   . acetaminophen  650 mg Oral Once  . azithromycin  500 mg Intravenous Once  . azithromycin  500 mg Intravenous QHS  . benzocaine      .  cefTRIAXone (ROCEPHIN)  IV  1 g Intravenous Once  . cefTRIAXone (ROCEPHIN)  IV  1 g Intravenous QHS  . deferasirox  250 mg Oral Daily  . diphenhydrAMINE  25 mg Intravenous Once  . diphenhydrAMINE  25 mg Intravenous Once  . folic acid  1 mg Oral Daily  . HYDROmorphone  2 mg Intravenous Once  . hydroxyurea  500 mg Oral TID WC  . nicotine  21 mg Transdermal Daily  . ondansetron  4 mg Intravenous Once  . ondansetron  4 mg Intravenous Once  . prednisoLONE sodium phosphate  1 drop Both Eyes QID   Continuous Infusions:    . sodium chloride 1,000 mL (11/12/11 1114)  . DISCONTD: sodium chloride 125 mL/hr at 11/12/11 0110  . DISCONTD: sodium chloride 125 mL/hr at 11/12/11 0528   PRN Meds:.acetaminophen, acetaminophen, albuterol, diphenhydrAMINE, HYDROmorphone (DILAUDID) injection, LORazepam, ondansetron (ZOFRAN) IV, ondansetron, promethazine, sodium chloride, DISCONTD:  HYDROmorphone (DILAUDID) injection, DISCONTD: LORazepam  Assessment/Plan: Sickle cell pain crisis Continue Dilaudid 2 mg IV q2h PRN with PRN benadryl, zofran, and phenergan. Continue IVF with half normal saline solution. Patient had exchange transfusion on 11/11/2011.  Hemoglobin 8.7 today.  Continue folic acid and hydroxyurea.  Leukocytosis with fever Etiology unclear.  Continue empiric ceftriaxone and azithromycin.  Blood culture shows no growth.  Discussed with Dr. Donnie Coffin, hematology covering for Dr. Myna Hidalgo, if cultures are negative and if white count continues to improve consider deescalating antibiotics in the next 1 to 2 days.  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  Chronic iron overload Continue deferasirox.  Prophylaxis Ambulate.  Disposition Pending improvement in pain and leukocytosis.   LOS: 0 days  Emrey Thornley A, MD 11/12/2011, 11:14 AM

## 2011-11-12 NOTE — H&P (Signed)
Christopher Warren is an 36 y.o. male.   Hematologist - Dr.Enever. Chief Complaint: Pain. HPI: 36 year old male with known history of sickle cell disease had come to the ER because of increasing pain in the low back. Patient has had a transfusion yesterday and was discharged home after which patient had developed some shortness of breath with nausea vomiting and increasing low back pain. In the ER patient was found to have a WBC count of 48,000 but his hemoglobin has increased from before transfusion. Chest x-ray did not show any infiltrates and UA did not show any features of UTI. Dr.Enever was consulted by the ER physician who recommended patient be admitted under hospitalist. Patient has been started on empiric antibiotics and blood cultures have been obtained. Patient was transferred to [facility] for further management. When I examined the patient, patient was not in acute distress. At this moment he is afebrile and is walking around. Still complaining of low back pain and joint pains but denies any shortness of breath cough phlegm abdominal pain.  Past Medical History  Diagnosis Date  . Sickle cell anemia   . Asthma   . 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 Heart murmur     Past Surgical History  Procedure Date  . Cholecystectomy     Family History  Problem Relation Age of Onset  . Sickle cell anemia Mother   . Sickle cell anemia Son    Social History:  reports that he has been smoking.  He does not have any smokeless tobacco history on file. He reports that he does not drink alcohol or use illicit drugs.  Allergies:  Allergies  Allergen Reactions  . Adhesive (Tape)   . Latex   . Morphine And Related Hives    Medications Prior to Admission  Medication Sig Dispense Refill  . deferasirox (EXJADE) 125 MG disintegrating tablet Take 250 mg by mouth daily.       . diphenhydrAMINE (BENADRYL) 25 mg capsule Take 25 mg by mouth every 6 (six) hours as needed.      . folic acid  (FOLVITE) 1 MG tablet Take 1 mg by mouth daily.        Marland Kitchen HYDROmorphone (DILAUDID) 4 MG tablet Take 1 tablet (4 mg total) by mouth every 6 (six) hours as needed for pain.  120 tablet  0  . hydroxyurea (HYDREA) 500 MG capsule Take 1 capsule (500 mg total) by mouth 3 (three) times daily. May take with food to minimize GI side effects.  90 capsule  4  . promethazine (PHENERGAN) 25 MG tablet Take 0.5 tablets (12.5 mg total) by mouth every 6 (six) hours as needed for nausea.  60 tablet  6  . prednisoLONE sodium phosphate (INFLAMASE FORTE) 1 % ophthalmic solution Place 1 drop into both eyes 4 (four) times daily.  5 mL  6    Results for orders placed during the hospital encounter of 11/12/11 (from the past 48 hour(s))  URINALYSIS, ROUTINE W REFLEX MICROSCOPIC     Status: Abnormal   Collection Time   11/12/11 12:21 AM      Component Value Range Comment   Color, Urine AMBER (*) YELLOW  BIOCHEMICALS MAY BE AFFECTED BY COLOR   APPearance CLEAR  CLEAR     Specific Gravity, Urine 1.012  1.005 - 1.030     pH 5.5  5.0 - 8.0     Glucose, UA NEGATIVE  NEGATIVE (mg/dL)    Hgb urine dipstick NEGATIVE  NEGATIVE  Bilirubin Urine NEGATIVE  NEGATIVE     Ketones, ur NEGATIVE  NEGATIVE (mg/dL)    Protein, ur NEGATIVE  NEGATIVE (mg/dL)    Urobilinogen, UA 1.0  0.0 - 1.0 (mg/dL)    Nitrite NEGATIVE  NEGATIVE     Leukocytes, UA NEGATIVE  NEGATIVE  MICROSCOPIC NOT DONE ON URINES WITH NEGATIVE PROTEIN, BLOOD, LEUKOCYTES, NITRITE, OR GLUCOSE <1000 mg/dL.  CBC     Status: Abnormal   Collection Time   11/12/11 12:40 AM      Component Value Range Comment   WBC 48.4 (*) 4.0 - 10.5 (K/uL)    RBC 2.73 (*) 4.22 - 5.81 (MIL/uL)    Hemoglobin 9.1 (*) 13.0 - 17.0 (g/dL)    HCT 16.1 (*) 09.6 - 52.0 (%)    MCV 90.8  78.0 - 100.0 (fL)    MCH 33.3  26.0 - 34.0 (pg)    MCHC 36.7 (*) 30.0 - 36.0 (g/dL)    RDW 04.5 (*) 40.9 - 15.5 (%)    Platelets 318  150 - 400 (K/uL)   BASIC METABOLIC PANEL     Status: Abnormal   Collection  Time   11/12/11 12:40 AM      Component Value Range Comment   Sodium 138  135 - 145 (mEq/L)    Potassium 3.6  3.5 - 5.1 (mEq/L)    Chloride 103  96 - 112 (mEq/L)    CO2 27  19 - 32 (mEq/L)    Glucose, Bld 130 (*) 70 - 99 (mg/dL)    BUN 5 (*) 6 - 23 (mg/dL)    Creatinine, Ser 8.11  0.50 - 1.35 (mg/dL)    Calcium 8.9  8.4 - 10.5 (mg/dL)    GFR calc non Af Amer >90  >90 (mL/min)    GFR calc Af Amer >90  >90 (mL/min)    Dg Chest 2 View  11/12/2011  *RADIOLOGY REPORT*  Clinical Data: Views chest pain.  Sickle cell anemia.  Smoker.  CHEST - 2 VIEW  Comparison: 10/03/2011.  Findings: Stable enlarged cardiac silhouette and right jugular porta-catheter.  The lungs remain mildly hyperexpanded with mild diffuse peribronchial thickening.  Unremarkable bones.  IMPRESSION:  1.  No acute abnormality. 2.  Stable cardiomegaly and mild changes of COPD and chronic bronchitis.  Original Report Authenticated By: Darrol Angel, M.D.    Review of Systems  Constitutional: Positive for fever and chills.  HENT: Negative.  Negative for hearing loss.   Eyes: Negative.   Respiratory: Positive for shortness of breath.   Cardiovascular: Negative.   Gastrointestinal: Negative.   Genitourinary: Negative.   Musculoskeletal: Positive for back pain.  Skin: Negative.   Neurological: Negative.   Endo/Heme/Allergies: Negative.   Psychiatric/Behavioral: Negative.     Blood pressure 119/51, pulse 71, temperature 98.4 F (36.9 C), temperature source Oral, resp. rate 17, height 5\' 5"  (1.651 m), weight 59 kg (130 lb 1.1 oz), SpO2 96.00%. Physical Exam  Constitutional: He is oriented to person, place, and time. He appears well-developed and well-nourished. No distress.  HENT:  Head: Normocephalic and atraumatic.  Right Ear: External ear normal.  Left Ear: External ear normal.  Nose: Nose normal.  Mouth/Throat: Oropharynx is clear and moist. No oropharyngeal exudate.  Eyes: Conjunctivae are normal. Pupils are equal,  round, and reactive to light. Right eye exhibits no discharge. Left eye exhibits no discharge. No scleral icterus.  Neck: Normal range of motion. Neck supple.  Cardiovascular: Normal rate and regular rhythm.   Respiratory: Effort  normal and breath sounds normal. No respiratory distress. He has no wheezes. He has no rales.  GI: Soft. Bowel sounds are normal. He exhibits no distension. There is no tenderness. There is no rebound.  Musculoskeletal: Normal range of motion. He exhibits no edema and no tenderness.  Neurological: He is alert and oriented to person, place, and time.       Moves all extremities.  Skin: Skin is warm and dry. No rash noted. He is not diaphoretic. No erythema.  Psychiatric: His behavior is normal.     Assessment/Plan #1. Sickle cell pain crisis - patient has been started on Dilaudid when necessary along with IV hydration. #2. Leukocytosis with fever - presently patient is afebrile. Blood cultures have been obtained and patient was empirically started on ceftriaxone and azithromycin which can be continued until culture results come back. Not sure if patient had developed an infection from transfusion. Patient's hemoglobin has increased after transfusion which will negate the fact of any immune hemolytic reaction related to transfusion. We will recheck CBC now along with LFTs and metabolic panel and continue IV rehydration.  CODE STATUS - full code.  Katurah Karapetian N. 11/12/2011, 5:52 AM

## 2011-11-12 NOTE — ED Notes (Signed)
MD at bedside. Gave test results and plan of care to pt.  Pt aware that he will be transferred to [facility]. Pt requested additional pain med. Orders will be placed.

## 2011-11-12 NOTE — ED Provider Notes (Signed)
History     CSN: [id number]  Arrival date & time 11/11/11  2353   First MD Initiated Contact with Patient 11/12/11 0009      Chief Complaint  Patient presents with  . Back Pain    (Consider location/radiation/quality/duration/timing/severity/associated sxs/prior treatment) HPI History provided by patient. Sickle cell patient had a blood transfusion earlier this afternoon. He had a reaction to transfusion, developed shortness of breath and required breathing treatment. He was sent home and tonight presents with nausea vomiting lower back pain typical of his sickle cell pains, fever and not feeling well. No cough and no further shortness of breath. No chest pains. No rash. Symptoms moderate in severity. Denies history of reaction to blood transfusion in the past. He called Dr. Myna Hidalgo and was referred here to the emergency department. Past Medical History  Diagnosis Date  . Sickle cell anemia   . Asthma   . 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 Heart murmur     History reviewed. No pertinent past surgical history.  Family History  Problem Relation Age of Onset  . Sickle cell anemia Mother   . Sickle cell anemia Son     History  Substance Use Topics  . Smoking status: Current Everyday Smoker  . Smokeless tobacco: Not on file  . Alcohol Use: No      Review of Systems  Constitutional: Positive for fever and chills.  HENT: Negative for neck pain and neck stiffness.   Eyes: Negative for pain.  Respiratory: Negative for cough.   Cardiovascular: Negative for chest pain.  Gastrointestinal: Positive for nausea and vomiting. Negative for abdominal pain.  Genitourinary: Negative for dysuria.  Musculoskeletal: Positive for back pain.  Skin: Negative for rash.  Neurological: Negative for headaches.  All other systems reviewed and are negative.    Allergies  Adhesive; Latex; and Morphine and related  Home Medications   Current Outpatient Rx  Name Route Sig Dispense  Refill  . DEFERASIROX 125 MG PO TBSO Oral Take 250 mg by mouth daily.     Marland Kitchen DIPHENHYDRAMINE HCL 25 MG PO CAPS Oral Take 25 mg by mouth every 6 (six) hours as needed.    Marland Kitchen FOLIC ACID 1 MG PO TABS Oral Take 1 mg by mouth daily.      Marland Kitchen HYDROMORPHONE HCL 4 MG PO TABS Oral Take 1 tablet (4 mg total) by mouth every 6 (six) hours as needed for pain. 120 tablet 0    1 month supply  . HYDROXYUREA 500 MG PO CAPS Oral Take 1 capsule (500 mg total) by mouth 3 (three) times daily. May take with food to minimize GI side effects. 90 capsule 4  . PREDNISOLONE SODIUM PHOSPHATE 1 % OP SOLN Both Eyes Place 1 drop into both eyes 2 (two) times daily. 10 mL 0  . PREDNISOLONE SODIUM PHOSPHATE 1 % OP SOLN Both Eyes Place 1 drop into both eyes 4 (four) times daily. 5 mL 6  . PROMETHAZINE HCL 25 MG PO TABS Oral Take 0.5 tablets (12.5 mg total) by mouth every 6 (six) hours as needed for nausea. 60 tablet 6    BP 121/69  Pulse 110  Temp(Src) 100.4 F (38 C) (Oral)  Resp 20  SpO2 97%  Physical Exam  Constitutional: He is oriented to person, place, and time. He appears well-developed and well-nourished.  HENT:  Head: Normocephalic and atraumatic.  Eyes: Conjunctivae and EOM are normal. Pupils are equal, round, and reactive to light.  Neck: Trachea normal. Neck  supple. No thyromegaly present.  Cardiovascular: Normal rate, regular rhythm, S1 normal, S2 normal and normal pulses.     No systolic murmur is present   No diastolic murmur is present  Pulses:      Radial pulses are 2+ on the right side, and 2+ on the left side.  Pulmonary/Chest: Effort normal and breath sounds normal. He has no wheezes. He has no rhonchi. He has no rales. He exhibits no tenderness.  Abdominal: Soft. Normal appearance and bowel sounds are normal. There is no tenderness. There is no CVA tenderness and negative Murphy's sign.  Musculoskeletal:       BLE:s Calves nontender, no cords or erythema, negative Homans sign  Neurological: He is  alert and oriented to person, place, and time. He has normal strength. No cranial nerve deficit or sensory deficit. GCS eye subscore is 4. GCS verbal subscore is 5. GCS motor subscore is 6.  Skin: Skin is warm and dry. No rash noted. He is not diaphoretic.  Psychiatric: His speech is normal.       Cooperative and appropriate    ED Course  Procedures (including critical care time)   Results for orders placed during the hospital encounter of 11/12/11  CBC      Component Value Range   WBC 48.4 (*) 4.0 - 10.5 (K/uL)   RBC 2.73 (*) 4.22 - 5.81 (MIL/uL)   Hemoglobin 9.1 (*) 13.0 - 17.0 (g/dL)   HCT 45.4 (*) 09.8 - 52.0 (%)   MCV 90.8  78.0 - 100.0 (fL)   MCH 33.3  26.0 - 34.0 (pg)   MCHC 36.7 (*) 30.0 - 36.0 (g/dL)   RDW 11.9 (*) 14.7 - 15.5 (%)   Platelets 318  150 - 400 (K/uL)  BASIC METABOLIC PANEL      Component Value Range   Sodium 138  135 - 145 (mEq/L)   Potassium 3.6  3.5 - 5.1 (mEq/L)   Chloride 103  96 - 112 (mEq/L)   CO2 27  19 - 32 (mEq/L)   Glucose, Bld 130 (*) 70 - 99 (mg/dL)   BUN 5 (*) 6 - 23 (mg/dL)   Creatinine, Ser 8.29  0.50 - 1.35 (mg/dL)   Calcium 8.9  8.4 - 56.2 (mg/dL)   GFR calc non Af Amer >90  >90 (mL/min)   GFR calc Af Amer >90  >90 (mL/min)  URINALYSIS, ROUTINE W REFLEX MICROSCOPIC      Component Value Range   Color, Urine AMBER (*) YELLOW    APPearance CLEAR  CLEAR    Specific Gravity, Urine 1.012  1.005 - 1.030    pH 5.5  5.0 - 8.0    Glucose, UA NEGATIVE  NEGATIVE (mg/dL)   Hgb urine dipstick NEGATIVE  NEGATIVE    Bilirubin Urine NEGATIVE  NEGATIVE    Ketones, ur NEGATIVE  NEGATIVE (mg/dL)   Protein, ur NEGATIVE  NEGATIVE (mg/dL)   Urobilinogen, UA 1.0  0.0 - 1.0 (mg/dL)   Nitrite NEGATIVE  NEGATIVE    Leukocytes, UA NEGATIVE  NEGATIVE    Dg Chest 2 View  11/12/2011  *RADIOLOGY REPORT*  Clinical Data: Views chest pain.  Sickle cell anemia.  Smoker.  CHEST - 2 VIEW  Comparison: 10/03/2011.  Findings: Stable enlarged cardiac silhouette and  right jugular porta-catheter.  The lungs remain mildly hyperexpanded with mild diffuse peribronchial thickening.  Unremarkable bones.  IMPRESSION:  1.  No acute abnormality. 2.  Stable cardiomegaly and mild changes of COPD and chronic bronchitis.  Original Report Authenticated By: Darrol Angel, M.D.   Tylenol, IVFs, IV dilaudid for pain, IV zofran.   12:26 AM case d/w Dr Myna Hidalgo - he refered PT to [facility] ED earlier - he does not feel his symptoms are due to Transfusion reaction at this point. ABx initiated. CXR and UA sent.   2:45 AM d/w Dr Jarrett Soho on call for [facility] - he accepts PT in tx to [facility] where Heme Onc is available.  MDM   Fever and nausea in a sickle cell patient who received blood transfusion earlier in the day. He had transfusion reaction at that time and was discharged home. He is a smoker. He has some baseline dry cough. Presentation and Workup do not suggest etiology for fever and significant leukocytosis. No ABD pain or tenderness. No UTI. No infiltrate on x-ray. No pharyngitis or meningismus. No rashes. Baseline lower back sickle cell pain without lower extremity deficits. Plan transfer admission [place] [facility].       Sunnie Nielsen, MD 11/12/11 [id number]

## 2011-11-12 NOTE — ED Notes (Signed)
MD at bedside. 

## 2011-11-12 NOTE — ED Notes (Signed)
[facility] present to transport pt to [place] 

## 2011-11-13 DIAGNOSIS — D7289 Other specified disorders of white blood cells: Secondary | ICD-10-CM

## 2011-11-13 DIAGNOSIS — D57219 Sickle-cell/Hb-C disease with crisis, unspecified: Secondary | ICD-10-CM

## 2011-11-13 DIAGNOSIS — R509 Fever, unspecified: Secondary | ICD-10-CM

## 2011-11-13 LAB — TYPE AND SCREEN
ABO/RH(D): O POS
Antibody Screen: NEGATIVE
Unit division: 0

## 2011-11-13 LAB — CBC
HCT: 23.8 % — ABNORMAL LOW (ref 39.0–52.0)
MCH: 32.3 pg (ref 26.0–34.0)
MCHC: 34.9 g/dL (ref 30.0–36.0)
RDW: 19.7 % — ABNORMAL HIGH (ref 11.5–15.5)

## 2011-11-13 LAB — BASIC METABOLIC PANEL
BUN: 8 mg/dL (ref 6–23)
Calcium: 8.9 mg/dL (ref 8.4–10.5)
GFR calc Af Amer: >90 mL/min (ref >90)
GFR calc non Af Amer: >90 mL/min (ref >90)
Glucose, Bld: 94 mg/dL (ref 70–99)
Potassium: 4 mEq/L (ref 3.5–5.1)
Sodium: 135 mEq/L (ref 135–145)

## 2011-11-13 LAB — PREPARE RBC (CROSSMATCH)

## 2011-11-13 MED ORDER — LORAZEPAM 1 MG PO TABS
1.0000 mg | ORAL_TABLET | Freq: Four times a day (QID) | ORAL | Status: DC | PRN
  Filled 2011-11-13: qty 1

## 2011-11-13 MED ORDER — LORAZEPAM 2 MG/ML IJ SOLN
0.5000 mg | Freq: Four times a day (QID) | INTRAMUSCULAR | Status: DC | PRN
  Administered 2011-11-13: 2 mg via INTRAVENOUS
  Administered 2011-11-14 – 2011-11-15 (×6): 1 mg via INTRAVENOUS
  Filled 2011-11-13 (×8): qty 1

## 2011-11-13 MED ORDER — DIPHENHYDRAMINE HCL 50 MG/ML IJ SOLN
25.0000 mg | Freq: Four times a day (QID) | INTRAMUSCULAR | Status: DC | PRN
  Administered 2011-11-13 (×2): 25 mg via INTRAVENOUS
  Filled 2011-11-13 (×2): qty 1

## 2011-11-13 NOTE — Progress Notes (Signed)
Patient requesting ativan. Upon arrival to room patient is sitting straight up asleep and snoring. IV was alarming and patient still did not wake up. Ativan will not be given.

## 2011-11-13 NOTE — Progress Notes (Signed)
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 During the night it was noted that patient became angry when he was not given the ativan because the nursing staff noted that he was still quite sedated. He also did not recall having phlebotomy draw labs this morning.  Patient again wanted to go off the unit today, he was again explained to him that he could not go off the unit by himself or with family for his safety, but he could go off unit with nurse or nurse's aide at their discretion.  Objective: Vital signs in last 24 hours: Filed Vitals:   11/12/11 0617 11/12/11 1353 11/12/11 2226 11/13/11 0622  BP: 109/64 126/58 106/73 112/68  Pulse: 65 87 80 78  Temp: 98 F (36.7 C) 98.3 F (36.8 C) 98.8 F (37.1 C) 98.2 F (36.8 C)  TempSrc: Oral Oral Oral Oral  Resp: 18 18 18 18   Height:      Weight:      SpO2: 96% 98% 96% 95%   Weight change:   Intake/Output Summary (Last 24 hours) at 11/13/11 1231 Last data filed at 11/12/11 1500  Gross per 24 hour  Intake 1123.56 ml  Output      0 ml  Net 1123.56 ml    Physical Exam: General: Oriented, No acute distress. HEENT: EOMI. Neck: Supple CV: S1 and S2 Lungs: Clear to ascultation bilaterally Abdomen: Soft, Nontender, Nondistended, +bowel sounds. Ext: Good pulses. Trace edema.  Lab Results: Basic Metabolic Panel:  Lab 11/13/11 1610 11/12/11 0555 11/12/11 0040  NA 135 138 138  K 4.0 4.1 3.6  CL 101 103 103  CO2 26 26 27   GLUCOSE 94 113* 130*  BUN 8 7 5*  CREATININE 0.57 0.62 0.60  CALCIUM 8.9 8.8 8.9  MG -- -- --  PHOS -- -- --   Liver Function Tests:  Lab 11/12/11 0555  AST 34  ALT 30  ALKPHOS 92  BILITOT 4.3*  PROT 7.3  ALBUMIN 3.5   No results found for this basename: LIPASE:5,AMYLASE:5 in the last 168 hours No results found for this basename: AMMONIA:5 in the last 168 hours CBC:  Lab 11/13/11 0330 11/12/11 0555 11/12/11 0040 11/11/11 0847  WBC 22.6* 37.4* 48.4*  20.6*  NEUTROABS -- 28.8* -- 13.3*  HGB 8.3* 8.7* 9.1* 7.1*  HCT 23.8* 24.8* 24.8* 20.6*  MCV 92.6 92.9 90.8 98  PLT 280 322 318 358 Large platelets present   Cardiac Enzymes: No results found for this basename: CKTOTAL:5,CKMB:5,CKMBINDEX:5,TROPONINI:5 in the last 168 hours BNP (last 3 results) No results found for this basename: PROBNP:3 in the last 8760 hours CBG: No results found for this basename: GLUCAP:5 in the last 168 hours No results found for this basename: HGBA1C:5 in the last 72 hours Other Labs: No components found with this basename: POCBNP:3 No results found for this basename: DDIMER:2 in the last 168 hours No results found for this basename: CHOL:2,HDL:2,LDLCALC:2,TRIG:2,CHOLHDL:2,LDLDIRECT:2 in the last 168 hours No results found for this basename: TSH,T4TOTAL,FREET3,T3FREE,FREET4,THYROIDAB in the last 168 hours No results found for this basename: VITAMINB12:2,FOLATE:2,FERRITIN:2,TIBC:2,IRON:2,RETICCTPCT:2 in the last 168 hours  Micro Results: Recent Results (from the past 240 hour(s))  CULTURE, BLOOD (SINGLE)     Status: Normal (Preliminary result)   Collection Time   11/12/11  1:20 AM      Component Value Range Status Comment   Specimen Description BLOOD CENTRAL LINE   Final    Special Requests BOTTLES DRAWN AEROBIC ONLY 9cc  Final    Culture  Setup Time [id number]   Final    Culture     Final    Value:        BLOOD CULTURE RECEIVED NO GROWTH TO DATE CULTURE WILL BE HELD FOR 5 DAYS BEFORE ISSUING A FINAL NEGATIVE REPORT   Report Status PENDING   Incomplete     Studies/Results: Dg Chest 2 View  11/12/2011  *RADIOLOGY REPORT*  Clinical Data: Views chest pain.  Sickle cell anemia.  Smoker.  CHEST - 2 VIEW  Comparison: 10/03/2011.  Findings: Stable enlarged cardiac silhouette and right jugular porta-catheter.  The lungs remain mildly hyperexpanded with mild diffuse peribronchial thickening.  Unremarkable bones.  IMPRESSION:  1.  No acute abnormality. 2.  Stable  cardiomegaly and mild changes of COPD and chronic bronchitis.  Original Report Authenticated By: Darrol Angel, M.D.    Medications: I have reviewed the patient's current medications. Scheduled Meds:    . deferasirox  250 mg Oral Daily  . folic acid  1 mg Oral Daily  . hydroxyurea  500 mg Oral TID WC  . nicotine  21 mg Transdermal Daily  . prednisoLONE sodium phosphate  1 drop Both Eyes QID  . DISCONTD: azithromycin  500 mg Intravenous QHS  . DISCONTD: cefTRIAXone (ROCEPHIN)  IV  1 g Intravenous QHS   Continuous Infusions:    . sodium chloride 100 mL/hr at 11/12/11 2223   PRN Meds:.acetaminophen, acetaminophen, albuterol, diphenhydrAMINE, HYDROmorphone (DILAUDID) injection, LORazepam, ondansetron (ZOFRAN) IV, ondansetron, promethazine, sodium chloride, DISCONTD: diphenhydrAMINE, DISCONTD: LORazepam, DISCONTD: LORazepam, DISCONTD: LORazepam  Assessment/Plan: Sickle cell pain crisis Continue Dilaudid 2 mg IV q2h PRN with PRN benadryl, zofran, and phenergan. Continue IVF with half normal saline solution. Patient had exchange transfusion on 11/11/2011.  Hemoglobin 8.3 today, will do another exchange transfusion.  Continue folic acid and hydroxyurea.  Leukocytosis with fever Etiology unclear.  Discontinue empiric ceftriaxone and azithromycin as blood cultures are negative to date and urine analysis was negative for urinary tract infection.    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  Chronic iron overload Continue deferasirox.  Prophylaxis Ambulate.  Disposition Pending improvement in pain and leukocytosis.   LOS: 1 day  Knoah Nedeau A, MD 11/13/2011, 12:31 PM

## 2011-11-13 NOTE — Progress Notes (Signed)
Patient has order for ativan iv prn anxiety, but he likes to take it for sleep. He complains that he could have had medication at 0400, but he did not get it. Patient was asleep at 0400 and was informed that the medication is ordered as needed for anxiety and that he has to ask for it, which he didn't because he was asleep.

## 2011-11-13 NOTE — Progress Notes (Signed)
Patient approached nurses station to ask if I gave him his sleep medication, referring to ativan. I told him that I did not because he was sitting straight up asleep with IV alarming and did not respond when I was standing directly in front of him. Also the nurse tech was able to obtain vital signs without barely arousing the patient. He became very upset and loud, referring to me as "homegirl", demanding that I call the doctor. Again his ativan was for anxiety, but if it were for sleep he was sound asleep and I would not wake him to give it. He wanted to know who the charge nurse was and I told him I was the charge nurse and he told me to stay out of his room and he will speak to the doctor himself. I will not return to the patient's room, I will send another nurse in the event he needs something from a nurse in attempt to not further anger the patient.

## 2011-11-13 NOTE — Progress Notes (Signed)
Patient very upset, girlfriend cursing on the unit at elevators about the patient not being able to go outside. Per notes and report it had been explained to the patient why he needed to stay on the unit and that we are a tobacco free facility. He requested to see the [facility]. Hilarie Fredrickson, RN came the unit to speak with the patient in the presence of my self and again it was explained why he could not leave the unit and go outside to smoke or ambulate. The patient is getting multiple sedating medications IV, including narcotics. It is against hospital policy, poor nursing judgement and it is simply not safe. The patient is still very upset and wants to speak to the owner of the hospital.

## 2011-11-14 DIAGNOSIS — D7289 Other specified disorders of white blood cells: Secondary | ICD-10-CM

## 2011-11-14 DIAGNOSIS — D57219 Sickle-cell/Hb-C disease with crisis, unspecified: Secondary | ICD-10-CM

## 2011-11-14 DIAGNOSIS — R509 Fever, unspecified: Secondary | ICD-10-CM

## 2011-11-14 LAB — CBC
HCT: 24.1 % — ABNORMAL LOW (ref 39.0–52.0)
Hemoglobin: 8.3 g/dL — ABNORMAL LOW (ref 13.0–17.0)
MCH: 32.2 pg (ref 26.0–34.0)
MCHC: 34.4 g/dL (ref 30.0–36.0)
MCV: 93.4 fL (ref 78.0–100.0)
Platelets: 278 K/uL (ref 150–400)
RBC: 2.58 MIL/uL — ABNORMAL LOW (ref 4.22–5.81)
RDW: 18.3 % — ABNORMAL HIGH (ref 11.5–15.5)
WBC: 19.1 K/uL — ABNORMAL HIGH (ref 4.0–10.5)

## 2011-11-14 LAB — PATHOLOGIST SMEAR REVIEW

## 2011-11-14 MED ORDER — DIPHENHYDRAMINE HCL 50 MG/ML IJ SOLN
25.0000 mg | INTRAMUSCULAR | Status: DC | PRN
  Administered 2011-11-14 – 2011-11-15 (×6): 25 mg via INTRAVENOUS
  Filled 2011-11-14 (×7): qty 1

## 2011-11-14 NOTE — Progress Notes (Signed)
CARE MANAGEMENT NOTE 11/14/2011  Patient:  JAIDEN, DINKINS   Account Number:  [id number]  Date Initiated:  11/14/2011  Documentation initiated by:  Marchello Rothgeb  Subjective/Objective Assessment:   patient admitted with scc and uncontrolled pain,     Action/Plan:   lives at home   Anticipated DC Date:  11/17/2011   Anticipated DC Plan:  HOME/SELF CARE  In-house referral  NA      DC Planning Services  NA      [facility] Choice  NA   Choice offered to / List presented to:  NA   DME arranged  NA      DME agency  NA     HH arranged  NA      HH agency  NA   Status of service:  In process, will continue to follow Medicare Important Message given?  NA - LOS <3 / Initial given by admissions (If response is "NO", the following Medicare IM given date fields will be blank) Date Medicare IM given:   Date Additional Medicare IM given:    Discharge Disposition:    Per UR Regulation:  Reviewed for med. necessity/level of care/duration of stay  If discussed at Long Length of Stay Meetings, dates discussed:    Comments:  [id number]/Kymari Lollis Stark Jock, BSN, CCM: [phone] Case Management Chart review for utilization review. No discharge need present at time of this review.

## 2011-11-14 NOTE — Progress Notes (Signed)
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 Patient expressed frustration with his nausea medications, went over nausea schedule with him. Patient was falling asleep at times while I was conversing with the patient.  Objective: Vital signs in last 24 hours: Filed Vitals:   11/14/11 0004 11/14/11 0102 11/14/11 0158 11/14/11 0559  BP: 123/72 115/64 123/71 125/70  Pulse: 101 97 94 22  Temp: 99.3 F (37.4 C) 99.5 F (37.5 C) 99.2 F (37.3 C) 98.9 F (37.2 C)  TempSrc: Oral Oral Oral Oral  Resp: 20 18 20 20   Height:      Weight:      SpO2:    96%   Weight change:   Intake/Output Summary (Last 24 hours) at 11/14/11 1111 Last data filed at 11/14/11 0700  Gross per 24 hour  Intake  502.5 ml  Output      0 ml  Net  502.5 ml    Physical Exam: General: Oriented, No acute distress. HEENT: EOMI. Neck: Supple CV: S1 and S2 Lungs: Clear to ascultation bilaterally Abdomen: Soft, Nontender, Nondistended, +bowel sounds. Ext: Good pulses. Trace edema.  Lab Results: Basic Metabolic Panel:  Lab 11/13/11 4540 11/12/11 0555 11/12/11 0040  NA 135 138 138  K 4.0 4.1 3.6  CL 101 103 103  CO2 26 26 27   GLUCOSE 94 113* 130*  BUN 8 7 5*  CREATININE 0.57 0.62 0.60  CALCIUM 8.9 8.8 8.9  MG -- -- --  PHOS -- -- --   Liver Function Tests:  Lab 11/12/11 0555  AST 34  ALT 30  ALKPHOS 92  BILITOT 4.3*  PROT 7.3  ALBUMIN 3.5   No results found for this basename: LIPASE:5,AMYLASE:5 in the last 168 hours No results found for this basename: AMMONIA:5 in the last 168 hours CBC:  Lab 11/14/11 0529 11/13/11 0330 11/12/11 0555 11/12/11 0040 11/11/11 0847  WBC 19.1* 22.6* 37.4* 48.4* 20.6*  NEUTROABS -- -- 28.8* -- 13.3*  HGB 8.3* 8.3* 8.7* 9.1* 7.1*  HCT 24.1* 23.8* 24.8* 24.8* 20.6*  MCV 93.4 92.6 92.9 90.8 98  PLT 278 280 322 318 358 Large platelets present   Cardiac Enzymes: No results found for this basename:  CKTOTAL:5,CKMB:5,CKMBINDEX:5,TROPONINI:5 in the last 168 hours BNP (last 3 results) No results found for this basename: PROBNP:3 in the last 8760 hours CBG: No results found for this basename: GLUCAP:5 in the last 168 hours No results found for this basename: HGBA1C:5 in the last 72 hours Other Labs: No components found with this basename: POCBNP:3 No results found for this basename: DDIMER:2 in the last 168 hours No results found for this basename: CHOL:2,HDL:2,LDLCALC:2,TRIG:2,CHOLHDL:2,LDLDIRECT:2 in the last 168 hours No results found for this basename: TSH,T4TOTAL,FREET3,T3FREE,FREET4,THYROIDAB in the last 168 hours No results found for this basename: VITAMINB12:2,FOLATE:2,FERRITIN:2,TIBC:2,IRON:2,RETICCTPCT:2 in the last 168 hours  Micro Results: Recent Results (from the past 240 hour(s))  CULTURE, BLOOD (SINGLE)     Status: Normal (Preliminary result)   Collection Time   11/12/11  1:20 AM      Component Value Range Status Comment   Specimen Description BLOOD CENTRAL LINE   Final    Special Requests BOTTLES DRAWN AEROBIC ONLY 9cc   Final    Culture  Setup Time [id number]   Final    Culture     Final    Value:        BLOOD CULTURE RECEIVED NO GROWTH TO DATE CULTURE WILL BE HELD FOR 5 DAYS BEFORE ISSUING A  FINAL NEGATIVE REPORT   Report Status PENDING   Incomplete     Studies/Results: No results found.  Medications: I have reviewed the patient's current medications. Scheduled Meds:    . deferasirox  250 mg Oral Daily  . folic acid  1 mg Oral Daily  . hydroxyurea  500 mg Oral TID WC  . nicotine  21 mg Transdermal Daily  . prednisoLONE sodium phosphate  1 drop Both Eyes QID  . DISCONTD: azithromycin  500 mg Intravenous QHS  . DISCONTD: cefTRIAXone (ROCEPHIN)  IV  1 g Intravenous QHS   Continuous Infusions:    . sodium chloride 100 mL/hr at 11/13/11 2038   PRN Meds:.acetaminophen, acetaminophen, albuterol, diphenhydrAMINE, HYDROmorphone (DILAUDID) injection, LORazepam,  ondansetron (ZOFRAN) IV, ondansetron, promethazine, sodium chloride, DISCONTD: diphenhydrAMINE, DISCONTD: diphenhydrAMINE, DISCONTD: LORazepam  Assessment/Plan: Sickle cell pain crisis Continue Dilaudid 2 mg IV q2h PRN with PRN benadryl, zofran, and phenergan. Continue IVF with half normal saline solution. Patient had exchange transfusion on 11/11/2011.  Hemoglobin 8.3 today. Had exchange transfusion on 11/13/2011.  Continue folic acid and hydroxyurea. Dr. Myna Hidalgo not in office today, discussed with his nurse. Instructed the patient to give the patient a schedule of his PRN medications so he can have better control.  Leukocytosis with fever Etiology unclear.  Discontinued empiric ceftriaxone and azithromycin on 11/13/2011 as blood cultures are negative to date and urine analysis was negative for urinary tract infection.    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  Chronic iron overload Continue deferasirox.  Prophylaxis Ambulate.  Disposition Pending improvement in pain.   LOS: 2 days  Cassidee Deats A, MD 11/14/2011, 11:11 AM

## 2011-11-15 DIAGNOSIS — D57219 Sickle-cell/Hb-C disease with crisis, unspecified: Secondary | ICD-10-CM

## 2011-11-15 DIAGNOSIS — R509 Fever, unspecified: Secondary | ICD-10-CM

## 2011-11-15 DIAGNOSIS — D7289 Other specified disorders of white blood cells: Secondary | ICD-10-CM

## 2011-11-15 LAB — CBC
MCH: 31.3 pg (ref 26.0–34.0)
MCV: 91.9 fL (ref 78.0–100.0)
Platelets: 257 10*3/uL (ref 150–400)
RBC: 2.59 MIL/uL — ABNORMAL LOW (ref 4.22–5.81)
RDW: 18 % — ABNORMAL HIGH (ref 11.5–15.5)

## 2011-11-15 MED ORDER — HYDROMORPHONE HCL PF 2 MG/ML IJ SOLN
2.0000 mg | INTRAMUSCULAR | Status: DC | PRN
  Administered 2011-11-15 – 2011-11-16 (×7): 4 mg via INTRAVENOUS
  Administered 2011-11-16: 2 mg via INTRAVENOUS
  Administered 2011-11-16 (×2): 4 mg via INTRAVENOUS
  Administered 2011-11-16: 2 mg via INTRAVENOUS
  Administered 2011-11-16 (×2): 4 mg via INTRAVENOUS
  Administered 2011-11-16: 2 mg via INTRAVENOUS
  Administered 2011-11-16 – 2011-11-17 (×6): 4 mg via INTRAVENOUS
  Filled 2011-11-15 (×12): qty 2
  Filled 2011-11-15: qty 1
  Filled 2011-11-15 (×6): qty 2

## 2011-11-15 MED ORDER — PROMETHAZINE HCL 25 MG/ML IJ SOLN
12.5000 mg | INTRAMUSCULAR | Status: DC | PRN
  Administered 2011-11-15 – 2011-11-17 (×7): 12.5 mg via INTRAVENOUS
  Filled 2011-11-15 (×7): qty 1

## 2011-11-15 MED ORDER — DIPHENHYDRAMINE HCL 50 MG/ML IJ SOLN
25.0000 mg | INTRAMUSCULAR | Status: DC | PRN
  Administered 2011-11-15 – 2011-11-17 (×9): 50 mg via INTRAVENOUS
  Filled 2011-11-15 (×11): qty 1

## 2011-11-15 MED ORDER — LORAZEPAM 2 MG/ML IJ SOLN
0.5000 mg | INTRAMUSCULAR | Status: DC | PRN
  Administered 2011-11-15 – 2011-11-17 (×10): 1.5 mg via INTRAVENOUS
  Filled 2011-11-15 (×10): qty 1

## 2011-11-15 NOTE — Progress Notes (Signed)
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 Patient again expressed frustration with his nausea, ativan and benadryl medications. I instructed him that he has two nausea medications, ativan q4h, benadryl q4h. Again went over his PRN regimen schedule. I told him that I am not comfortable changing his frequency of ativan and benadryl at this time. Increased the dose of dilaudid, ativan, phenergan and benadryl. Discussed with Dr. Myna Hidalgo who suggested that dilaudid can be increased to 4 mg q2h.  Objective: Vital signs in last 24 hours: Filed Vitals:   11/14/11 0559 11/14/11 1506 11/14/11 2200 11/15/11 0825  BP: 125/70 110/66 114/78 96/53  Pulse: 22 75 77 73  Temp: 98.9 F (37.2 C) 97.4 F (36.3 C) 98.4 F (36.9 C) 98.3 F (36.8 C)  TempSrc: Oral Axillary Oral Oral  Resp: 20 20 20 18   Height:      Weight:      SpO2: 96% 92% 96% 90%   Weight change:   Intake/Output Summary (Last 24 hours) at 11/15/11 0956 Last data filed at 11/14/11 1735  Gross per 24 hour  Intake 1058.33 ml  Output      0 ml  Net 1058.33 ml    Physical Exam: General: Oriented, No acute distress. HEENT: EOMI. Neck: Supple CV: S1 and S2 Lungs: Clear to ascultation bilaterally Abdomen: Soft, Nontender, Nondistended, +bowel sounds. Ext: Good pulses. Trace edema.  Lab Results: Basic Metabolic Panel:  Lab 11/13/11 1610 11/12/11 0555 11/12/11 0040  NA 135 138 138  K 4.0 4.1 3.6  CL 101 103 103  CO2 26 26 27   GLUCOSE 94 113* 130*  BUN 8 7 5*  CREATININE 0.57 0.62 0.60  CALCIUM 8.9 8.8 8.9  MG -- -- --  PHOS -- -- --   Liver Function Tests:  Lab 11/12/11 0555  AST 34  ALT 30  ALKPHOS 92  BILITOT 4.3*  PROT 7.3  ALBUMIN 3.5   No results found for this basename: LIPASE:5,AMYLASE:5 in the last 168 hours No results found for this basename: AMMONIA:5 in the last 168 hours CBC:  Lab 11/15/11 0402 11/14/11 0529 11/13/11 0330 11/12/11 0555 11/12/11 0040    WBC 18.3* 19.1* 22.6* 37.4* 48.4*  NEUTROABS -- -- -- 28.8* --  HGB 8.1* 8.3* 8.3* 8.7* 9.1*  HCT 23.8* 24.1* 23.8* 24.8* 24.8*  MCV 91.9 93.4 92.6 92.9 90.8  PLT 257 278 280 322 318   Cardiac Enzymes: No results found for this basename: CKTOTAL:5,CKMB:5,CKMBINDEX:5,TROPONINI:5 in the last 168 hours BNP (last 3 results) No results found for this basename: PROBNP:3 in the last 8760 hours CBG: No results found for this basename: GLUCAP:5 in the last 168 hours No results found for this basename: HGBA1C:5 in the last 72 hours Other Labs: No components found with this basename: POCBNP:3 No results found for this basename: DDIMER:2 in the last 168 hours No results found for this basename: CHOL:2,HDL:2,LDLCALC:2,TRIG:2,CHOLHDL:2,LDLDIRECT:2 in the last 168 hours No results found for this basename: TSH,T4TOTAL,FREET3,T3FREE,FREET4,THYROIDAB in the last 168 hours No results found for this basename: VITAMINB12:2,FOLATE:2,FERRITIN:2,TIBC:2,IRON:2,RETICCTPCT:2 in the last 168 hours  Micro Results: Recent Results (from the past 240 hour(s))  CULTURE, BLOOD (SINGLE)     Status: Normal (Preliminary result)   Collection Time   11/12/11  1:20 AM      Component Value Range Status Comment   Specimen Description BLOOD CENTRAL LINE   Final    Special Requests BOTTLES DRAWN AEROBIC ONLY 9cc   Final    Culture  Setup Time [id number]   Final    Culture     Final    Value:        BLOOD CULTURE RECEIVED NO GROWTH TO DATE CULTURE WILL BE HELD FOR 5 DAYS BEFORE ISSUING A FINAL NEGATIVE REPORT   Report Status PENDING   Incomplete     Studies/Results: No results found.  Medications: I have reviewed the patient's current medications. Scheduled Meds:    . deferasirox  250 mg Oral Daily  . folic acid  1 mg Oral Daily  . hydroxyurea  500 mg Oral TID WC  . nicotine  21 mg Transdermal Daily  . prednisoLONE sodium phosphate  1 drop Both Eyes QID   Continuous Infusions:    . sodium chloride 100 mL/hr  at 11/14/11 2354   PRN Meds:.acetaminophen, acetaminophen, albuterol, diphenhydrAMINE, HYDROmorphone (DILAUDID) injection, LORazepam, ondansetron (ZOFRAN) IV, ondansetron, promethazine, sodium chloride, DISCONTD: diphenhydrAMINE, DISCONTD: diphenhydrAMINE, DISCONTD:  HYDROmorphone (DILAUDID) injection, DISCONTD: LORazepam, DISCONTD: promethazine  Assessment/Plan: Sickle cell pain crisis Increased Dilaudid 2-4 mg IV q2h PRN. Increased PRN benadryl, ativan, and phenergan. Continue IVF with half normal saline solution. Patient had exchange transfusion on 11/11/2011.  Hemoglobin 8.1 today. Had exchange transfusion on 11/13/2011.  Continue folic acid and hydroxyurea. Discussed with Dr. Myna Hidalgo who will assume patient's care after he sees the patient. Perform another exchange transfusion today.   Leukocytosis with fever At baseline.  Discontinued empiric ceftriaxone and azithromycin on 11/13/2011 as blood cultures are negative to date and urine analysis was negative for urinary tract infection.    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  Chronic iron overload Continue deferasirox.  Prophylaxis Ambulate.  Disposition Pending improvement in pain.   LOS: 3 days  Samir Ishaq A, MD 11/15/2011, 9:56 AM

## 2011-11-16 DIAGNOSIS — D57219 Sickle-cell/Hb-C disease with crisis, unspecified: Secondary | ICD-10-CM

## 2011-11-16 DIAGNOSIS — D7289 Other specified disorders of white blood cells: Secondary | ICD-10-CM

## 2011-11-16 DIAGNOSIS — R509 Fever, unspecified: Secondary | ICD-10-CM

## 2011-11-16 DIAGNOSIS — D5700 Hb-SS disease with crisis, unspecified: Principal | ICD-10-CM

## 2011-11-16 LAB — CBC
Hemoglobin: 9.3 g/dL — ABNORMAL LOW (ref 13.0–17.0)
MCH: 30.8 pg (ref 26.0–34.0)
MCHC: 33.8 g/dL (ref 30.0–36.0)

## 2011-11-16 LAB — PREPARE RBC (CROSSMATCH)

## 2011-11-16 MED ORDER — ACETAMINOPHEN 325 MG PO TABS
650.0000 mg | ORAL_TABLET | Freq: Once | ORAL | Status: AC
  Administered 2011-11-16: 650 mg via ORAL

## 2011-11-16 MED ORDER — FUROSEMIDE 10 MG/ML IJ SOLN
20.0000 mg | Freq: Once | INTRAMUSCULAR | Status: AC
  Administered 2011-11-16: 20 mg via INTRAVENOUS
  Filled 2011-11-16: qty 2

## 2011-11-16 MED ORDER — DIPHENHYDRAMINE HCL 50 MG/ML IJ SOLN
25.0000 mg | Freq: Once | INTRAMUSCULAR | Status: AC
  Administered 2011-11-16: 25 mg via INTRAVENOUS

## 2011-11-16 MED ORDER — DEXTROSE 5 % IV SOLN
2000.0000 mg | Freq: Once | INTRAVENOUS | Status: AC
  Administered 2011-11-16: 2000 mg via INTRAVENOUS
  Filled 2011-11-16 (×2): qty 2

## 2011-11-16 NOTE — Consult Note (Signed)
#  [id number] is note.  Will do one more exchange today and then he can be discharged.  I will  Give a dose of Desferal post exchange.  I am not too sure how compliant he is with his Exjade.  He does not need any pain meds on d/c.  I very much appreciate the great help by Hospitalists!!!  I would also try incentive spirometer for him.  I am praying hard!!!  Christopher E.

## 2011-11-16 NOTE — Progress Notes (Signed)
REFERRING PHYSICIAN:  Andreas Blower, MD  CONSULTATION NOTE  REASON FOR CONSULTATION: 1. Sickle cell crisis. 2. Hemoglobin SS disease.  HISTORY OF PRESENT ILLNESS:  Mr. Kreis is a 36 year old African American gentleman.  He is well-known to me.  He has hemoglobin SS disease.  He does have iron overload.  He is on Exjade.  I am still not sure how often he is taking this.  He gets crises in which he just develops total body aches.  He had a fever on the 31st.  He had just been in the office getting an exchange. He was subsequently admitted on the 31st.  When he was admitted, he had a chest x-ray done.  Chest x-ray was unremarkable.  He had lab work done which showed hemoglobin of 8.7 and a hematocrit of 24.8.  White cell count 37.4.  He has been on antibiotics.  All those cultures have been negative.  He had a urinalysis done, which was unremarkable.  He did have a relatively brisk hemolysis.  Today, his white count was 22.6, hemoglobin was 9.3, hematocrit 27.5, platelet count 336.  He is improving.  He is on Dilaudid at home.  He has been talked to regarding some long-acting pain medication.  He has been reluctant to do this.  He has been receiving IV fluids.  He has been getting Ativan.  Overall, his status is improving, which is typical for him.  PAST MEDICAL HISTORY: 1. Remarkable for sickle cell disease (hemoglobin SS disease). 2. Asthma. 3. Allergic conjunctivitis.  ALLERGIES: 1. Latex. 2. Morphine.  HOME MEDICATIONS:  Exjade 250 mg p.o. daily, folic acid 1 mg p.o. daily, Dilaudid 4 mg q.6 hours p.r.n., Hydrea 500 mg p.o. t.i.d., Phenergan 12.5 mg p.o. q.6 hours, and prednisolone eyedrops 0.1% daily.  SOCIAL HISTORY:  Remarkable for tobacco use.  He has no alcohol use. Currently there is no obvious recreational drug use.  FAMILY HISTORY:  Remarkable for sickle cell.  PHYSICAL EXAMINATION:  This is a somewhat small but well-developed black gentleman in no  obvious distress.  Vital signs:  97.8, pulse 104, respiratory rate 18, blood pressure 109/69.  His oxygen saturation is91% on room air.  Head and neck:  A normocephalic, atraumatic skull.  There are no ocular or oral lesions.  There are no palpable cervical or supraclavicular lymph nodes.  Lungs:  Clear bilaterally.  Cardiac: Regular rate and rhythm with a normal S1 and S2.  He has a 1/6 systolic ejection murmur.  Abdomen:  Soft with good bowel sounds.  He has no fluid wave.  He has no palpable abdominal mass.  There is no palpable hepatosplenomegaly.  Extremities:  No clubbing, cyanosis, or edema.  He has good range of motion of his joints.  Neurologic:  No focal neurological deficits.  IMPRESSION:  Mr. Hammers is a 36 year old African American gentleman with another sickle cell crisis.  He is improving.  Exchanges do seem to help him out.  He will get 1 more exchange today.  He says he feels ready go home.  I will certainly try to get him out of the hospital as he does not do well the longer he is in the hospital.  We will certainly follow him up as an outpatient.  He let us know when he needs blood work.  An incentive spirometer might help him.  I will order this.  I will order a dose of Desferal after his exchange today.  Again, I am unsure how compliant he has  been with his Exjade.  Again, I totally appreciate the help by the hospitalist with his admission.  Again, can be discharged today after the exchange.    ______________________________ [name], M.D. PRE/MEDQ  D:  11/16/2011  T:  11/16/2011  Job:  [id number]

## 2011-11-16 NOTE — Progress Notes (Signed)
Subjective: Seen by Dr. Myna Hidalgo. Planning for another exchange transfusion today. Patient reports having significant pain.  Objective: Vital signs in last 24 hours: Filed Vitals:   11/15/11 1424 11/15/11 1630 11/15/11 2208 11/16/11 0628  BP: 107/50 105/62 123/70 109/69  Pulse: 82 95 100 104  Temp: 98.7 F (37.1 C) 98.6 F (37 C) 99.2 F (37.3 C) 97.8 F (36.6 C)  TempSrc: Oral Oral Oral Oral  Resp: 16 16 20 18   Height:      Weight:      SpO2:   100% 91%   Weight change:   Intake/Output Summary (Last 24 hours) at 11/16/11 1208 Last data filed at 11/15/11 1750  Gross per 24 hour  Intake 1533.33 ml  Output    300 ml  Net 1233.33 ml    Physical Exam: General: Oriented, No acute distress. HEENT: EOMI. Neck: Supple CV: S1 and S2 Lungs: Clear to ascultation bilaterally Abdomen: Soft, Nontender, Nondistended, +bowel sounds. Ext: Good pulses. Trace edema.  Lab Results: Basic Metabolic Panel:  Lab 11/13/11 7829 11/12/11 0555 11/12/11 0040  NA 135 138 138  K 4.0 4.1 3.6  CL 101 103 103  CO2 26 26 27   GLUCOSE 94 113* 130*  BUN 8 7 5*  CREATININE 0.57 0.62 0.60  CALCIUM 8.9 8.8 8.9  MG -- -- --  PHOS -- -- --   Liver Function Tests:  Lab 11/12/11 0555  AST 34  ALT 30  ALKPHOS 92  BILITOT 4.3*  PROT 7.3  ALBUMIN 3.5   No results found for this basename: LIPASE:5,AMYLASE:5 in the last 168 hours No results found for this basename: AMMONIA:5 in the last 168 hours CBC:  Lab 11/16/11 0345 11/15/11 0402 11/14/11 0529 11/13/11 0330 11/12/11 0555  WBC 22.6* 18.3* 19.1* 22.6* 37.4*  NEUTROABS -- -- -- -- 28.8*  HGB 9.3* 8.1* 8.3* 8.3* 8.7*  HCT 27.5* 23.8* 24.1* 23.8* 24.8*  MCV 91.1 91.9 93.4 92.6 92.9  PLT 336 257 278 280 322   Cardiac Enzymes: No results found for this basename: CKTOTAL:5,CKMB:5,CKMBINDEX:5,TROPONINI:5 in the last 168 hours BNP (last 3 results) No results found for this basename: PROBNP:3 in the last 8760 hours CBG: No results found for  this basename: GLUCAP:5 in the last 168 hours No results found for this basename: HGBA1C:5 in the last 72 hours Other Labs: No components found with this basename: POCBNP:3 No results found for this basename: DDIMER:2 in the last 168 hours No results found for this basename: CHOL:2,HDL:2,LDLCALC:2,TRIG:2,CHOLHDL:2,LDLDIRECT:2 in the last 168 hours No results found for this basename: TSH,T4TOTAL,FREET3,T3FREE,FREET4,THYROIDAB in the last 168 hours No results found for this basename: VITAMINB12:2,FOLATE:2,FERRITIN:2,TIBC:2,IRON:2,RETICCTPCT:2 in the last 168 hours  Micro Results: Recent Results (from the past 240 hour(s))  CULTURE, BLOOD (SINGLE)     Status: Normal (Preliminary result)   Collection Time   11/12/11  1:20 AM      Component Value Range Status Comment   Specimen Description BLOOD CENTRAL LINE   Final    Special Requests BOTTLES DRAWN AEROBIC ONLY 9cc   Final    Culture  Setup Time [id number]   Final    Culture     Final    Value:        BLOOD CULTURE RECEIVED NO GROWTH TO DATE CULTURE WILL BE HELD FOR 5 DAYS BEFORE ISSUING A FINAL NEGATIVE REPORT   Report Status PENDING   Incomplete     Studies/Results: No results found.  Medications: I have reviewed the patient's current medications. Scheduled Meds:    .  acetaminophen  650 mg Oral Once  . deferasirox  250 mg Oral Daily  . deferoxamine (DESFERAL) IV  2,000 mg Intravenous Once  . diphenhydrAMINE  25 mg Intravenous Once  . folic acid  1 mg Oral Daily  . furosemide  20 mg Intravenous Once  . hydroxyurea  500 mg Oral TID WC  . nicotine  21 mg Transdermal Daily  . prednisoLONE sodium phosphate  1 drop Both Eyes QID   Continuous Infusions:    . sodium chloride 1,000 mL (11/16/11 0759)   PRN Meds:.acetaminophen, acetaminophen, albuterol, diphenhydrAMINE, HYDROmorphone (DILAUDID) injection, LORazepam, ondansetron (ZOFRAN) IV, ondansetron, promethazine, sodium chloride  Assessment/Plan: Sickle cell pain  crisis Continue Dilaudid 2-4 mg IV q2h PRN. Continue PRN benadryl, ativan, zofran and phenergan. Continue IVF with half normal saline solution. Patient had exchange transfusion on 11/11/2011.  Hemoglobin 8.1 today. Had exchange transfusion on 11/13/2011 (1 unit). Exchange transfusion on 11/15/2011 (1 unit). Plan for another exchange transfusion today (2 units) Continue folic acid and hydroxyurea. Has adequate pain meds at home.  Leukocytosis with fever At baseline.  Discontinued empiric ceftriaxone and azithromycin on 11/13/2011 as blood cultures are negative to date and urine analysis was negative for urinary tract infection.    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  Chronic iron overload Continue deferasirox. Dr. Myna Hidalgo ordered deferasirox after exchange transfusion.  Prophylaxis Ambulate.  Disposition Per Dr. Myna Hidalgo DC the patient after today's exchange transfusion and have patient followup with Dr. Myna Hidalgo in clinic.    LOS: 4 days  Lakeith Careaga A, MD 11/16/2011, 12:08 PM

## 2011-11-16 NOTE — Discharge Summary (Signed)
Discharge Summary  Christopher Warren MR#: [id number]  DOB:Mar 28, 1976  Date of Admission: 11/12/2011 Date of Discharge: 11/16/2011  Patient's PCP: [name], MD, MD  Attending Physician:Marlisha Vanwyk A  Consults: Treatment Team:  [name], MD, Hematology  Discharge Diagnoses: Principal Problem:  *Sickle cell pain crisis Active Problems:  Leukocytosis  Hyperbilirubinemia  Fever  Brief Admitting History and Physical 36 year old male with known history of sickle cell disease had come to the ER because of increasing pain in the low back on 11/12/2011.  Discharge Medications Medication List  As of 11/16/2011 12:14 PM   TAKE these medications         deferasirox 125 MG disintegrating tablet   Commonly known as: EXJADE   Take 250 mg by mouth daily.      diphenhydrAMINE 25 mg capsule   Commonly known as: BENADRYL   Take 25 mg by mouth every 6 (six) hours as needed.      folic acid 1 MG tablet   Commonly known as: FOLVITE   Take 1 mg by mouth daily.      HYDROmorphone 4 MG tablet   Commonly known as: DILAUDID   Take 1 tablet (4 mg total) by mouth every 6 (six) hours as needed for pain.      hydroxyurea 500 MG capsule   Commonly known as: HYDREA   Take 1 capsule (500 mg total) by mouth 3 (three) times daily. May take with food to minimize GI side effects.      prednisoLONE sodium phosphate 1 % ophthalmic solution   Commonly known as: INFLAMASE FORTE   Place 1 drop into both eyes 4 (four) times daily.      promethazine 25 MG tablet   Commonly known as: PHENERGAN   Take 0.5 tablets (12.5 mg total) by mouth every 6 (six) hours as needed for nausea.            Hospital Course: Sickle cell pain crisis Patient's pain was controlled on Dilaudid 2-4 mg IV q2h PRN. Continue PRN benadryl, ativan, zofran and phenergan. Continue IVF with half normal saline solution. Patient had exchange transfusion on 11/11/2011.  Had exchange transfusion on 11/13/2011 (1 unit).  Exchange transfusion on 11/15/2011 (1 unit). Plan for another exchange transfusion on 11/16/2011 (2 units), prior to discharge. Continue folic acid and hydroxyurea. Has adequate pain meds at home, per Dr. Myna Hidalgo.  Leukocytosis with fever At baseline.  Initially started on empiric ceftriaxone and azithromycin, which was discontinued after 3 days as blood cultures are negative to date and urine analysis was negative for urinary tract infection.    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  Chronic iron overload Continue deferasirox. Dr. Myna Hidalgo ordered IV deferasirox after exchange transfusion.  Prophylaxis Ambulate.  Disposition Per Dr. Myna Hidalgo DC the patient after today's exchange transfusion and have patient followup with Dr. Myna Hidalgo in clinic.   Day of Discharge BP 109/69  Pulse 104  Temp(Src) 97.8 F (36.6 C) (Oral)  Resp 18  Ht 5\' 5"  (1.651 m)  Wt 59 kg (130 lb 1.1 oz)  BMI 21.65 kg/m2  SpO2 91%  Results for orders placed during the hospital encounter of 11/12/11 (from the past 48 hour(s))  CBC     Status: Abnormal   Collection Time   11/15/11  4:02 AM      Component Value Range Comment   WBC 18.3 (*) 4.0 - 10.5 (K/uL) WHITE COUNT CONFIRMED ON SMEAR   RBC 2.59 (*) 4.22 - 5.81 (MIL/uL)    Hemoglobin  8.1 (*) 13.0 - 17.0 (g/dL)    HCT 21.3 (*) 08.6 - 52.0 (%)    MCV 91.9  78.0 - 100.0 (fL)    MCH 31.3  26.0 - 34.0 (pg)    MCHC 34.0  30.0 - 36.0 (g/dL)    RDW 57.8 (*) 46.9 - 15.5 (%)    Platelets 257  150 - 400 (K/uL)   PREPARE RBC (CROSSMATCH)     Status: Normal   Collection Time   11/15/11 10:30 AM      Component Value Range Comment   Order Confirmation ORDER PROCESSED BY BLOOD BANK     CBC     Status: Abnormal   Collection Time   11/16/11  3:45 AM      Component Value Range Comment   WBC 22.6 (*) 4.0 - 10.5 (K/uL)    RBC 3.02 (*) 4.22 - 5.81 (MIL/uL)    Hemoglobin 9.3 (*) 13.0 - 17.0 (g/dL)    HCT 62.9 (*) 52.8 - 52.0 (%)    MCV 91.1  78.0 - 100.0 (fL)    MCH  30.8  26.0 - 34.0 (pg)    MCHC 33.8  30.0 - 36.0 (g/dL)    RDW 41.3 (*) 24.4 - 15.5 (%)    Platelets 336  150 - 400 (K/uL)   PREPARE RBC (CROSSMATCH)     Status: Normal   Collection Time   11/16/11  7:30 AM      Component Value Range Comment   Order Confirmation ORDER PROCESSED BY BLOOD BANK       Dg Chest 2 View  11/12/2011  *RADIOLOGY REPORT*  Clinical Data: Views chest pain.  Sickle cell anemia.  Smoker.  CHEST - 2 VIEW  Comparison: 10/03/2011.  Findings: Stable enlarged cardiac silhouette and right jugular porta-catheter.  The lungs remain mildly hyperexpanded with mild diffuse peribronchial thickening.  Unremarkable bones.  IMPRESSION:  1.  No acute abnormality. 2.  Stable cardiomegaly and mild changes of COPD and chronic bronchitis.  Original Report Authenticated By: Darrol Angel, M.D.   Disposition: Home  Diet: Regular  Activity: Resume as tolerated.   Follow-up Appts: Discharge Orders    Future Orders Please Complete By Expires   Diet - low sodium heart healthy      Increase activity slowly      Discharge instructions      Comments:   Followup with Dr. Myna Hidalgo after discharge.      TESTS THAT NEED FOLLOW-UP None  Time spent on discharge, talking to the patient, and coordinating care: 25 mins.   Signed: Cristal Ford, MD 11/16/2011, 12:14 PM

## 2011-11-17 LAB — TYPE AND SCREEN
Antibody Screen: NEGATIVE
Unit division: 0
Unit division: 0

## 2011-11-17 LAB — CBC
HCT: 26.7 % — ABNORMAL LOW (ref 39.0–52.0)
MCHC: 34.8 g/dL (ref 30.0–36.0)
RDW: 17.5 % — ABNORMAL HIGH (ref 11.5–15.5)

## 2011-11-17 MED ORDER — HEPARIN SOD (PORK) LOCK FLUSH 100 UNIT/ML IV SOLN
500.0000 [IU] | INTRAVENOUS | Status: AC | PRN
  Administered 2011-11-17: 500 [IU]

## 2011-11-17 NOTE — Progress Notes (Signed)
Voices understanding of D/c instructions. No changes noted since am assessment.  Pt was able to explain back d/c instructions.

## 2011-11-17 NOTE — Progress Notes (Signed)
Pt unhooked himself from his porta cath and states "I am taking a shower"  Informed pt that this is not hospital policy and that we could not unhook his iv line.  Pt states that he will take a shower.  Encouraged pt to call when he is finished so he can be re hooked to iv fluids. States " I will and I will take care of that too."

## 2011-11-18 LAB — CULTURE, BLOOD (SINGLE): Culture: NO GROWTH

## 2011-11-18 NOTE — Progress Notes (Signed)
Patient asked to leave unit to go and buy pizza, Dr, notified, patient given permission to leave unit with NT, stayed gone about 30 mins and very un ruling, patient in stable condition at this time

## 2011-11-28 ENCOUNTER — Other Ambulatory Visit: Payer: Self-pay | Admitting: *Deleted

## 2011-11-28 DIAGNOSIS — D571 Sickle-cell disease without crisis: Secondary | ICD-10-CM

## 2011-11-28 MED ORDER — HYDROMORPHONE HCL 4 MG PO TABS: 4.0000 mg | ORAL_TABLET | Freq: Four times a day (QID) | ORAL | Status: DC | PRN

## 2011-11-28 NOTE — Telephone Encounter (Signed)
Pt called requesting a Dilaudid refill. Reviewed with Dr Myna Hidalgo.Christopher Warren to refill. Will route the rx to Dr Myna Hidalgo to sign then place at front desk for him  pick up.

## 2011-11-28 NOTE — Telephone Encounter (Signed)
Printed rx for Dr Myna Hidalgo to sign as he was unable to approve the once routed to him. It was signed by him then placed at the front desk.

## 2011-12-13 ENCOUNTER — Encounter (HOSPITAL_COMMUNITY)
Admission: RE | Admit: 2011-12-13 | Discharge: 2011-12-13 | Disposition: A | Discharge status: Home / Self Care | Payer: Medicaid Other | Source: Ambulatory Visit | Attending: Hematology & Oncology | Admitting: Hematology & Oncology

## 2011-12-13 DIAGNOSIS — D571 Sickle-cell disease without crisis: Secondary | ICD-10-CM | POA: Insufficient documentation

## 2011-12-28 ENCOUNTER — Other Ambulatory Visit: Payer: Self-pay | Admitting: Hematology & Oncology

## 2011-12-28 ENCOUNTER — Other Ambulatory Visit (HOSPITAL_BASED_OUTPATIENT_CLINIC_OR_DEPARTMENT_OTHER): Payer: Medicaid Other | Admitting: Lab

## 2011-12-28 ENCOUNTER — Other Ambulatory Visit: Payer: Self-pay | Admitting: *Deleted

## 2011-12-28 ENCOUNTER — Ambulatory Visit (HOSPITAL_BASED_OUTPATIENT_CLINIC_OR_DEPARTMENT_OTHER): Payer: Medicaid Other

## 2011-12-28 VITALS — BP 136/76 | HR 90 | Temp 96.6°F

## 2011-12-28 DIAGNOSIS — L298 Other pruritus: Secondary | ICD-10-CM

## 2011-12-28 DIAGNOSIS — E860 Dehydration: Secondary | ICD-10-CM

## 2011-12-28 DIAGNOSIS — D571 Sickle-cell disease without crisis: Secondary | ICD-10-CM

## 2011-12-28 DIAGNOSIS — R52 Pain, unspecified: Secondary | ICD-10-CM

## 2011-12-28 DIAGNOSIS — R110 Nausea: Secondary | ICD-10-CM

## 2011-12-28 DIAGNOSIS — L299 Pruritus, unspecified: Secondary | ICD-10-CM

## 2011-12-28 LAB — PREPARE RBC (CROSSMATCH)

## 2011-12-28 LAB — CBC WITH DIFFERENTIAL (CANCER CENTER ONLY)
BASO%: 0.5 % (ref 0.0–2.0)
LYMPH%: 28.1 % (ref 14.0–48.0)
MCH: 34.5 pg — ABNORMAL HIGH (ref 28.0–33.4)
MCV: 99 fL — ABNORMAL HIGH (ref 82–98)
MONO%: 11.4 % (ref 0.0–13.0)
Platelets: 319 10*3/uL (ref 145–400)
RDW: 20.5 % — ABNORMAL HIGH (ref 11.1–15.7)
WBC: 22.9 10*3/uL — ABNORMAL HIGH (ref 4.0–10.0)

## 2011-12-28 LAB — TECHNOLOGIST REVIEW CHCC SATELLITE

## 2011-12-28 MED ORDER — SODIUM CHLORIDE 0.9 % IJ SOLN
10.0000 mL | INTRAMUSCULAR | Status: DC | PRN
  Administered 2011-12-28: 10 mL via INTRAVENOUS
  Filled 2011-12-28: qty 10

## 2011-12-28 MED ORDER — DIPHENHYDRAMINE HCL 50 MG/ML IJ SOLN
12.5000 mg | Freq: Once | INTRAMUSCULAR | Status: AC
  Administered 2011-12-28: 50 mg via INTRAVENOUS

## 2011-12-28 MED ORDER — HYDROMORPHONE HCL PF 1 MG/ML IJ SOLN
2.0000 mg | Freq: Once | INTRAMUSCULAR | Status: AC
  Administered 2011-12-28: 2 mg via INTRAVENOUS
  Filled 2011-12-28: qty 2

## 2011-12-28 MED ORDER — HYDROMORPHONE HCL 4 MG PO TABS: 4.0000 mg | ORAL_TABLET | Freq: Four times a day (QID) | ORAL | Status: DC | PRN

## 2011-12-28 MED ORDER — SODIUM CHLORIDE 0.9 % IV SOLN
Freq: Once | INTRAVENOUS | Status: AC
  Administered 2011-12-28: 11:00:00 via INTRAVENOUS

## 2011-12-28 MED ORDER — HEPARIN SOD (PORK) LOCK FLUSH 100 UNIT/ML IV SOLN
500.0000 [IU] | Freq: Once | INTRAVENOUS | Status: AC
  Administered 2011-12-28: 500 [IU] via INTRAVENOUS
  Filled 2011-12-28: qty 5

## 2011-12-28 MED ORDER — HYDROMORPHONE HCL PF 4 MG/ML IJ SOLN
4.0000 mg | Freq: Once | INTRAMUSCULAR | Status: AC
  Administered 2011-12-28: 4 mg via INTRAVENOUS

## 2011-12-28 MED ORDER — PROMETHAZINE HCL 25 MG/ML IJ SOLN
12.5000 mg | Freq: Four times a day (QID) | INTRAMUSCULAR | Status: AC | PRN
  Administered 2011-12-28: 25 mg via INTRAVENOUS

## 2011-12-28 NOTE — Patient Instructions (Signed)
Dehydration, Adult Dehydration is when you lose more fluids from the body than you take in. Vital organs like the kidneys, brain, and heart cannot function without a proper amount of fluids and salt. Any loss of fluids from the body can cause dehydration.  CAUSES   Vomiting.   Diarrhea.   Excessive sweating.   Excessive urine output.   Fever.  SYMPTOMS  Mild dehydration  Thirst.   Dry lips.   Slightly dry mouth.  Moderate dehydration  Very dry mouth.   Sunken eyes.   Skin does not bounce back quickly when lightly pinched and released.   Dark urine and decreased urine production.   Decreased tear production.   Headache.  Severe dehydration  Very dry mouth.   Extreme thirst.   Rapid, weak pulse (more than 100 beats per minute at rest).   Cold hands and feet.   Not able to sweat in spite of heat and temperature.   Rapid breathing.   Blue lips.   Confusion and lethargy.   Difficulty being awakened.   Minimal urine production.   No tears.  DIAGNOSIS  Your caregiver will diagnose dehydration based on your symptoms and your exam. Blood and urine tests will help confirm the diagnosis. The diagnostic evaluation should also identify the cause of dehydration. TREATMENT  Treatment of mild or moderate dehydration can often be done at home by increasing the amount of fluids that you drink. It is best to drink small amounts of fluid more often. Drinking too much at one time can make vomiting worse. Refer to the home care instructions below. Severe dehydration needs to be treated at the hospital where you will probably be given intravenous (IV) fluids that contain water and electrolytes. HOME CARE INSTRUCTIONS   Ask your caregiver about specific rehydration instructions.   Drink enough fluids to keep your urine clear or pale yellow.   Drink small amounts frequently if you have nausea and vomiting.   Eat as you normally do.   Avoid:   Foods or drinks high in  sugar.   Carbonated drinks.   Juice.   Extremely hot or cold fluids.   Drinks with caffeine.   Fatty, greasy foods.   Alcohol.   Tobacco.   Overeating.   Gelatin desserts.   Wash your hands well to avoid spreading bacteria and viruses.   Only take over-the-counter or prescription medicines for pain, discomfort, or fever as directed by your caregiver.   Ask your caregiver if you should continue all prescribed and over-the-counter medicines.   Keep all follow-up appointments with your caregiver.  SEEK MEDICAL CARE IF:  You have abdominal pain and it increases or stays in one area (localizes).   You have a rash, stiff neck, or severe headache.   You are irritable, sleepy, or difficult to awaken.   You are weak, dizzy, or extremely thirsty.  SEEK IMMEDIATE MEDICAL CARE IF:   You are unable to keep fluids down or you get worse despite treatment.   You have frequent episodes of vomiting or diarrhea.   You have blood or green matter (bile) in your vomit.   You have blood in your stool or your stool looks black and tarry.   You have not urinated in 6 to 8 hours, or you have only urinated a small amount of very dark urine.   You have a fever.   You faint.  MAKE SURE YOU:   Understand these instructions.   Will watch your condition.     Will get help right away if you are not doing well or get worse.  Document Released: 05/30/2005 Document Revised: 05/19/2011 Document Reviewed: 01/17/2011 [employer] Patient Information 2012 [employer]. 

## 2011-12-28 NOTE — Progress Notes (Signed)
error 

## 2011-12-29 ENCOUNTER — Ambulatory Visit (HOSPITAL_BASED_OUTPATIENT_CLINIC_OR_DEPARTMENT_OTHER): Payer: Medicaid Other

## 2011-12-29 VITALS — BP 110/50 | HR 84 | Temp 98.8°F | Resp 20 | Wt 126.0 lb

## 2011-12-29 DIAGNOSIS — D571 Sickle-cell disease without crisis: Secondary | ICD-10-CM

## 2011-12-29 MED ORDER — SODIUM CHLORIDE 0.9 % IJ SOLN
10.0000 mL | INTRAMUSCULAR | Status: AC | PRN
Start: 2011-12-29 — End: 2011-12-29
  Administered 2011-12-29: 10 mL
  Filled 2011-12-29: qty 10

## 2011-12-29 MED ORDER — DIPHENHYDRAMINE HCL 50 MG/ML IJ SOLN
25.0000 mg | Freq: Once | INTRAMUSCULAR | Status: AC
  Administered 2011-12-29: 25 mg via INTRAVENOUS

## 2011-12-29 MED ORDER — HEPARIN SOD (PORK) LOCK FLUSH 100 UNIT/ML IV SOLN
500.0000 [IU] | Freq: Every day | INTRAVENOUS | Status: AC | PRN
  Administered 2011-12-29: 500 [IU]
  Filled 2011-12-29: qty 5

## 2011-12-29 MED ORDER — HYDROMORPHONE HCL PF 4 MG/ML IJ SOLN
2.0000 mg | Freq: Once | INTRAMUSCULAR | Status: AC
  Administered 2011-12-29: 2 mg via INTRAVENOUS

## 2011-12-29 MED ORDER — FUROSEMIDE 10 MG/ML IJ SOLN
20.0000 mg | Freq: Once | INTRAMUSCULAR | Status: AC
  Administered 2011-12-29: 20 mg via INTRAVENOUS

## 2011-12-29 MED ORDER — SODIUM CHLORIDE 0.9 % IV SOLN
INTRAVENOUS | Status: DC
  Administered 2011-12-29: 11:00:00 via INTRAVENOUS

## 2011-12-29 MED ORDER — ACETAMINOPHEN 325 MG PO TABS
650.0000 mg | ORAL_TABLET | Freq: Once | ORAL | Status: AC
  Administered 2011-12-29: 650 mg via ORAL

## 2011-12-29 MED ORDER — HYDROMORPHONE HCL PF 4 MG/ML IJ SOLN
4.0000 mg | Freq: Once | INTRAMUSCULAR | Status: AC
  Administered 2011-12-29: 4 mg via INTRAVENOUS

## 2011-12-29 MED ORDER — PROMETHAZINE HCL 25 MG/ML IJ SOLN
12.5000 mg | Freq: Once | INTRAMUSCULAR | Status: AC
  Administered 2011-12-29: 12.5 mg via INTRAVENOUS

## 2011-12-29 MED ORDER — SODIUM CHLORIDE 0.9 % IV SOLN
15.0000 mg/kg/h | Freq: Once | INTRAVENOUS | Status: AC
  Administered 2011-12-29: 15 mg/kg/h via INTRAVENOUS
  Filled 2011-12-29: qty 2

## 2011-12-29 NOTE — Progress Notes (Signed)
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  CURRENT THERAPY: 1. Exchange transfusions as indicated. 2. Exjade - I am still not sure if the patient is taking this     regularly.  INTERIM HISTORY:  [name] comes in for an unscheduled visit.  We have not seen him for awhile.  Last time we saw him was probably about 6 weeks ago.  He had been hospitalized and was discharged.  He does have a Port-A-Cath in.  I suppose he has been doing okay until recently. He called saying that he was hurting.  He had no fever.  He had no obvious bleeding.  He has had no obvious change in his medications.  We have not checked his ferritin for a few months.  He often does not make his appointments.  We had him come in, we gave him some IV fluids and some IV pain medication.  PHYSICAL EXAMINATION:  Vital signs:  All stable.  Temperature is 96.6, pulse 90, respiratory 18, blood pressure 136/76.  Lungs:  Clear bilaterally.  There is no rales, wheezes or rhonchi.  Cardiac:  Regular rate and rhythm with no murmurs, rubs or bruits.  Abdomen:  Soft with good bowel sounds.  There is no palpable abdominal mass.  There is no palpable hepatosplenomegaly.  Extremities:  Shows some slight tenderness over the long bones.  Neurological:  No focal neurological deficits.  LABORATORY STUDIES:  White cell count is 22.9, hemoglobin 6.9, hematocrit 19.7 and platelet count is 319.  MCV is 99.  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  He is on Hydrea.  I think he is taking the Hydrea given that his MCV is on the higher side.  He is also on folic acid, which I hope that he is taking. We are going to have to do a transfusion on him.  I do not think we can do an exchange because his hemoglobin is low.  We probably will have to think about doing an exchange on him next week.  I really do want to check his iron studies.  Again, whether or not he is taking the Exjade is  totally unclear to me.  Sometimes it is difficult to totally figure out what [name] is doing.  We will get him set up with the transfusion tomorrow.  While he is here I will go ahead and check lab work on him, so we can see what his iron studies are.  I will give him Desferal tomorrow.    ______________________________ [name], M.D. PRE/MEDQ  D:  12/28/2011  T:  12/29/2011  Job:  [id number]

## 2011-12-29 NOTE — Patient Instructions (Signed)
Blood Transfusion   A blood transfusion replaces your blood or some of its parts. Blood is replaced when you have lost blood because of surgery, an accident, or for severe blood conditions like anemia.  You can donate blood to be used on yourself if you have a planned surgery. If you lose blood during that surgery, your own blood can be given back to you.  Any blood given to you is checked to make sure it matches your blood type. Your temperature, blood pressure, and heart rate (vital signs) will be checked often.   GET HELP RIGHT AWAY IF:    You feel sick to your stomach (nauseous) or throw up (vomit).   You have watery poop (diarrhea).   You have shortness of breath or trouble breathing.   You have blood in your pee (urine) or have dark colored pee.   You have chest pain or tightness.   Your eyes or skin turn yellow (jaundice).   You have a temperature by mouth above 102 F (38.9 C), not controlled by medicine.   You start to shake and have chills.   You develop a a red rash (hives) or feel itchy.   You develop lightheadedness or feel confused.   You develop back, joint, or muscle pain.   You do not feel hungry (lost appetite).   You feel tired, restless, or nervous.   You develop belly (abdominal) cramps.  Document Released: 08/26/2008 Document Revised: 05/19/2011 Document Reviewed: 08/26/2008  [employer] Patient Information 2012 [employer].

## 2011-12-30 LAB — TYPE AND SCREEN
ABO/RH(D): O POS
Antibody Screen: NEGATIVE

## 2012-01-03 LAB — HGB ELECTROPHORESIS REFLEXED REPORT
Hemoglobin A - HGBRFX: 26.6 % — ABNORMAL LOW (ref >96.0)
Hemoglobin A2 - HGBRFX: 1.8 % (ref 1.8–3.5)
Hemoglobin Elect C: 2.3 % — ABNORMAL HIGH
Hemoglobin F - HGBRFX: 8 % — ABNORMAL HIGH (ref <2.0)
Hemoglobin S - HGBRFX: 61.3 % — ABNORMAL HIGH

## 2012-01-03 LAB — FERRITIN: Ferritin: 4627 ng/mL — ABNORMAL HIGH (ref 22–322)

## 2012-01-03 LAB — RETICULOCYTES (CHCC): ABS Retic: 393.5 10*3/uL — ABNORMAL HIGH (ref 19.0–186.0)

## 2012-01-12 ENCOUNTER — Encounter (HOSPITAL_COMMUNITY)
Admission: RE | Admit: 2012-01-12 | Discharge: 2012-01-12 | Disposition: A | Discharge status: Home / Self Care | Payer: Medicaid Other | Source: Ambulatory Visit | Attending: Hematology & Oncology | Admitting: Hematology & Oncology

## 2012-01-12 DIAGNOSIS — D571 Sickle-cell disease without crisis: Secondary | ICD-10-CM | POA: Insufficient documentation

## 2012-01-27 ENCOUNTER — Observation Stay (HOSPITAL_COMMUNITY)
Admission: EM | Admit: 2012-01-27 | Discharge: 2012-01-27 | Disposition: A | Discharge status: Other Acute Inpatient Hospital | Payer: Medicaid Other | Attending: Emergency Medicine | Admitting: Emergency Medicine

## 2012-01-27 ENCOUNTER — Encounter (HOSPITAL_COMMUNITY): Payer: Self-pay | Admitting: Hematology

## 2012-01-27 ENCOUNTER — Other Ambulatory Visit: Payer: Self-pay | Admitting: *Deleted

## 2012-01-27 ENCOUNTER — Observation Stay (HOSPITAL_COMMUNITY)
Admission: AD | Admit: 2012-01-27 | Discharge: 2012-01-28 | Disposition: A | Discharge status: Home / Self Care | Payer: Medicaid Other | Attending: Internal Medicine | Admitting: Internal Medicine

## 2012-01-27 ENCOUNTER — Emergency Department (HOSPITAL_COMMUNITY): Payer: Medicaid Other

## 2012-01-27 ENCOUNTER — Telehealth (HOSPITAL_COMMUNITY): Payer: Self-pay | Admitting: Internal Medicine

## 2012-01-27 DIAGNOSIS — I517 Cardiomegaly: Secondary | ICD-10-CM | POA: Insufficient documentation

## 2012-01-27 DIAGNOSIS — D72829 Elevated white blood cell count, unspecified: Secondary | ICD-10-CM | POA: Diagnosis present

## 2012-01-27 DIAGNOSIS — M545 Low back pain, unspecified: Secondary | ICD-10-CM | POA: Insufficient documentation

## 2012-01-27 DIAGNOSIS — J45909 Unspecified asthma, uncomplicated: Secondary | ICD-10-CM | POA: Insufficient documentation

## 2012-01-27 DIAGNOSIS — D571 Sickle-cell disease without crisis: Secondary | ICD-10-CM

## 2012-01-27 DIAGNOSIS — Z79899 Other long term (current) drug therapy: Secondary | ICD-10-CM | POA: Insufficient documentation

## 2012-01-27 DIAGNOSIS — D5700 Hb-SS disease with crisis, unspecified: Principal | ICD-10-CM | POA: Diagnosis present

## 2012-01-27 LAB — COMPREHENSIVE METABOLIC PANEL
ALT: 36 U/L (ref 0–53)
AST: 59 U/L — ABNORMAL HIGH (ref 0–37)
Calcium: 8.5 mg/dL (ref 8.4–10.5)
Sodium: 136 mEq/L (ref 135–145)
Total Protein: 7.5 g/dL (ref 6.0–8.3)

## 2012-01-27 LAB — CBC
MCV: 98.5 fL (ref 78.0–100.0)
Platelets: 285 10*3/uL (ref 150–400)
RBC: 1.95 MIL/uL — ABNORMAL LOW (ref 4.22–5.81)
WBC: 17.5 10*3/uL — ABNORMAL HIGH (ref 4.0–10.5)

## 2012-01-27 LAB — RETICULOCYTES
Retic Count, Absolute: 620.1 10*3/uL — ABNORMAL HIGH (ref 19.0–186.0)
Retic Ct Pct: 31.8 % — ABNORMAL HIGH (ref 0.4–3.1)

## 2012-01-27 MED ORDER — PROMETHAZINE HCL 25 MG/ML IJ SOLN
25.0000 mg | Freq: Once | INTRAMUSCULAR | Status: DC
  Filled 2012-01-27: qty 1

## 2012-01-27 MED ORDER — DIPHENHYDRAMINE HCL 50 MG/ML IJ SOLN
12.5000 mg | INTRAMUSCULAR | Status: DC | PRN
  Administered 2012-01-27 – 2012-01-28 (×5): 25 mg via INTRAVENOUS
  Filled 2012-01-27 (×5): qty 1

## 2012-01-27 MED ORDER — SODIUM CHLORIDE 0.9 % IV BOLUS (SEPSIS)
1000.0000 mL | Freq: Once | INTRAVENOUS | Status: AC
  Administered 2012-01-27: 1000 mL via INTRAVENOUS

## 2012-01-27 MED ORDER — HYDROMORPHONE HCL PF 1 MG/ML IJ SOLN
1.0000 mg | Freq: Once | INTRAMUSCULAR | Status: AC
  Administered 2012-01-27: 1 mg via INTRAVENOUS
  Filled 2012-01-27: qty 1

## 2012-01-27 MED ORDER — HYDROMORPHONE HCL 4 MG PO TABS: 4.0000 mg | ORAL_TABLET | ORAL | Status: DC | PRN

## 2012-01-27 MED ORDER — FOLIC ACID 1 MG PO TABS
1.0000 mg | ORAL_TABLET | Freq: Every day | ORAL | Status: DC
  Administered 2012-01-27 – 2012-01-28 (×2): 1 mg via ORAL
  Filled 2012-01-27 (×2): qty 1

## 2012-01-27 MED ORDER — ONDANSETRON HCL 4 MG/2ML IJ SOLN
4.0000 mg | Freq: Once | INTRAMUSCULAR | Status: AC
Start: 2012-01-27 — End: 2012-01-27
  Administered 2012-01-27: 4 mg via INTRAVENOUS
  Filled 2012-01-27: qty 2

## 2012-01-27 MED ORDER — ONDANSETRON HCL 4 MG/2ML IJ SOLN
4.0000 mg | INTRAMUSCULAR | Status: DC | PRN
  Administered 2012-01-27 – 2012-01-28 (×4): 4 mg via INTRAVENOUS
  Filled 2012-01-27 (×4): qty 2

## 2012-01-27 MED ORDER — ONDANSETRON HCL 4 MG PO TABS: 4.0000 mg | ORAL_TABLET | Freq: Four times a day (QID) | ORAL | Status: DC | PRN

## 2012-01-27 MED ORDER — ONDANSETRON HCL 4 MG PO TABS: 4.0000 mg | ORAL_TABLET | ORAL | Status: DC | PRN

## 2012-01-27 MED ORDER — DIPHENHYDRAMINE HCL 25 MG PO CAPS
25.0000 mg | ORAL_CAPSULE | Freq: Once | ORAL | Status: AC
  Administered 2012-01-27: 25 mg via ORAL
  Filled 2012-01-27: qty 1

## 2012-01-27 MED ORDER — DEXTROSE-NACL 5-0.45 % IV SOLN
INTRAVENOUS | Status: DC
  Administered 2012-01-27 (×2): via INTRAVENOUS

## 2012-01-27 MED ORDER — DEXTROSE 5 % IV SOLN
2000.0000 mg | Freq: Once | INTRAVENOUS | Status: AC
  Administered 2012-01-28: 2000 mg via INTRAVENOUS
  Filled 2012-01-27: qty 2

## 2012-01-27 MED ORDER — HYDROXYUREA 500 MG PO CAPS: 500.0000 mg | ORAL_CAPSULE | Freq: Three times a day (TID) | ORAL | Status: DC

## 2012-01-27 MED ORDER — LORAZEPAM 1 MG PO TABS
1.0000 mg | ORAL_TABLET | ORAL | Status: DC | PRN
  Administered 2012-01-27: 1 mg via ORAL
  Filled 2012-01-27: qty 1

## 2012-01-27 MED ORDER — HYDROMORPHONE HCL PF 2 MG/ML IJ SOLN
4.0000 mg | Freq: Once | INTRAMUSCULAR | Status: AC
  Administered 2012-01-27: 4 mg via INTRAVENOUS
  Filled 2012-01-27: qty 2

## 2012-01-27 MED ORDER — DIPHENHYDRAMINE HCL 25 MG PO CAPS: 25.0000 mg | ORAL_CAPSULE | ORAL | Status: DC | PRN

## 2012-01-27 MED ORDER — DIPHENHYDRAMINE HCL 25 MG PO CAPS
25.0000 mg | ORAL_CAPSULE | Freq: Four times a day (QID) | ORAL | Status: DC | PRN
  Administered 2012-01-27: 25 mg via ORAL
  Filled 2012-01-27: qty 1

## 2012-01-27 MED ORDER — HYDROMORPHONE HCL PF 2 MG/ML IJ SOLN
4.0000 mg | INTRAMUSCULAR | Status: DC | PRN
  Administered 2012-01-27 – 2012-01-28 (×5): 4 mg via INTRAVENOUS
  Filled 2012-01-27 (×4): qty 2

## 2012-01-27 MED ORDER — HYDROMORPHONE HCL PF 2 MG/ML IJ SOLN
2.0000 mg | INTRAMUSCULAR | Status: DC | PRN
  Administered 2012-01-27: 2 mg via INTRAVENOUS

## 2012-01-27 MED ORDER — HYDROMORPHONE HCL PF 2 MG/ML IJ SOLN
INTRAMUSCULAR | Status: AC
  Filled 2012-01-27: qty 1

## 2012-01-27 MED ORDER — ONDANSETRON HCL 4 MG/2ML IJ SOLN
4.0000 mg | Freq: Four times a day (QID) | INTRAMUSCULAR | Status: DC | PRN
  Administered 2012-01-27: 4 mg via INTRAVENOUS
  Filled 2012-01-27: qty 2

## 2012-01-27 MED ORDER — HYDROMORPHONE HCL PF 2 MG/ML IJ SOLN
2.0000 mg | INTRAMUSCULAR | Status: DC | PRN
  Administered 2012-01-27 (×3): 2 mg via INTRAVENOUS
  Administered 2012-01-27: 4 mg via INTRAVENOUS
  Administered 2012-01-27: 2 mg via INTRAVENOUS
  Administered 2012-01-27: 4 mg via INTRAVENOUS
  Filled 2012-01-27: qty 1
  Filled 2012-01-27 (×2): qty 2
  Filled 2012-01-27: qty 1
  Filled 2012-01-27: qty 2
  Filled 2012-01-27: qty 1
  Filled 2012-01-27: qty 2

## 2012-01-27 MED ORDER — LORAZEPAM 2 MG/ML IJ SOLN
1.0000 mg | INTRAMUSCULAR | Status: DC | PRN
  Administered 2012-01-27 – 2012-01-28 (×3): 1 mg via INTRAVENOUS
  Filled 2012-01-27 (×3): qty 1

## 2012-01-27 NOTE — ED Notes (Signed)
Report given to Aram Beecham, RN-discharge to sickle cell clinic

## 2012-01-27 NOTE — Progress Notes (Signed)
Patient ID: Christopher Warren, male   DOB: Aug 27, 1975, 36 y.o.   MRN: [id number] Patient returned back to unit, port was already accessed, Right chest port was flushed with blood return. IVF restarted assessment done. Patient upset about not receiving multiple sodas in the unit, was offered ice water for hydration. Family member and child on unit going to restroom this RN look to make sure she was going in the right direction, and patient felt RN was following family member. This RN explained wanted to make sure family member was going the right direction. MD notified per patient request.

## 2012-01-27 NOTE — ED Notes (Signed)
Report received-airway intact-no s/s's of distress-anxious to go to sickle cell clinic-will continue to monitor

## 2012-01-27 NOTE — ED Notes (Signed)
Patient states he is very uncomfortable and agitated-does not understand why he can not be transferred to sickle cell clinic now, he was told he could go at 0700-states he is not getting any relief here-offered reassurance- explained to patient that call has been placed to sickle cell clinic MD-patient remains fustrated

## 2012-01-27 NOTE — ED Notes (Signed)
Pt with hgb of 6.9 md informed.

## 2012-01-27 NOTE — Progress Notes (Signed)
Patient ID: Christopher Warren, male   DOB: Jul 20, 1975, 36 y.o.   MRN: [id number] Patient stated he was ready to go home. Company secretary notified. This RN advised patient that the plan he made with Dr. August Saucer is he was going to stay over night. Patient ambulating with IV pole, NAD. States he wants to go smoke a cigarette, this RN advised patient per the policy if he leaves the floor I need him to sign a AMA form stating he is refusing services and he is free to go. Patient states he would stay but wanted to know when the MD was coming to discharge him. Flow manager contacted again & stated someone on hospitalist team should be calling.

## 2012-01-27 NOTE — Progress Notes (Signed)
8:31 received report from stacey west rn concerning patient to be admitted to sickle cell unit, patient is stable, sickle cell crisis (generalize) vitals stable. Pt will be discharge from ED.

## 2012-01-27 NOTE — Progress Notes (Addendum)
Patient ID: Christopher Warren, male   DOB: October 09, 1975, 36 y.o.   MRN: [id number] 18:15   Patient had family emergency and wanted to leave sickle cell hospital and return back to sickle cell hospital for treatment, Dr August Saucer notified of patient situation and inform that patient can leave and return back. Per Dr. August Saucer patient left via port access, will return 01/27/2012

## 2012-01-27 NOTE — ED Provider Notes (Signed)
History     CSN: [id number]  Arrival date & time 01/27/12  0447   First MD Initiated Contact with Patient 01/27/12 0449      Chief Complaint  Patient presents with  . Sickle Cell Pain Crisis    (Consider location/radiation/quality/duration/timing/severity/associated sxs/prior treatment) HPI HX per PT- having typical sickle cell pains in hi slow back, no trauma, no weakness/ numbness, no fevers, no Cp or SOB. H/o same and having pain despite taking dilaudid at home as prescribed. PT states he is here to be admitted to the sickle cell clinic. Pain is sharp, not radiating and severe. No known alleviating factors  Past Medical History  Diagnosis Date  . Sickle cell anemia   . Asthma   . 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 Heart murmur     Past Surgical History  Procedure Date  . Cholecystectomy     Family History  Problem Relation Age of Onset  . Sickle cell anemia Mother   . Sickle cell anemia Son     History  Substance Use Topics  . Smoking status: Current Everyday Smoker  . Smokeless tobacco: Not on file  . Alcohol Use: No      Review of Systems  Constitutional: Negative for fever and chills.  HENT: Negative for neck pain and neck stiffness.   Eyes: Negative for pain.  Respiratory: Negative for shortness of breath.   Cardiovascular: Negative for chest pain.  Gastrointestinal: Negative for abdominal pain.  Genitourinary: Negative for dysuria.  Musculoskeletal: Positive for back pain.  Skin: Negative for rash.  Neurological: Negative for headaches.  All other systems reviewed and are negative.    Allergies  Adhesive; Latex; and Morphine and related  Home Medications   Current Outpatient Rx  Name Route Sig Dispense Refill  . DEFERASIROX 125 MG PO TBSO Oral Take 250 mg by mouth daily.     Marland Kitchen FOLIC ACID 1 MG PO TABS Oral Take 1 mg by mouth daily.      Marland Kitchen HYDROMORPHONE HCL 4 MG PO TABS Oral Take 4 mg by mouth every 6 (six) hours as needed. For pain    .  HYDROXYUREA 500 MG PO CAPS Oral Take 500 mg by mouth 3 (three) times daily.    Marland Kitchen DIPHENHYDRAMINE HCL 25 MG PO CAPS Oral Take 25 mg by mouth every 6 (six) hours as needed.      BP 115/48  Pulse 107  Temp 99.2 F (37.3 C) (Oral)  Resp 16  SpO2 93%  Physical Exam  Constitutional: He is oriented to person, place, and time. He appears well-developed and well-nourished.  HENT:  Head: Normocephalic and atraumatic.  Eyes: Conjunctivae and EOM are normal. Pupils are equal, round, and reactive to light.  Neck: Trachea normal. Neck supple. No thyromegaly present.  Cardiovascular: Normal rate, regular rhythm, S1 normal, S2 normal and normal pulses.     No systolic murmur is present   No diastolic murmur is present  Pulses:      Radial pulses are 2+ on the right side, and 2+ on the left side.  Pulmonary/Chest: Effort normal and breath sounds normal. He has no wheezes. He has no rhonchi. He has no rales. He exhibits no tenderness.  Abdominal: Soft. Normal appearance and bowel sounds are normal. There is no tenderness. There is no CVA tenderness and negative Murphy's sign.  Musculoskeletal:       paralumbar tenderness, no deformity, no rash, no lesion. BLE:s Calves nontender, no cords or erythema, negative Homans  sign  Neurological: He is alert and oriented to person, place, and time. He has normal strength. No cranial nerve deficit or sensory deficit. GCS eye subscore is 4. GCS verbal subscore is 5. GCS motor subscore is 6.  Skin: Skin is warm and dry. No rash noted. He is not diaphoretic.  Psychiatric: His speech is normal.       Cooperative and appropriate    ED Course  Procedures (including critical care time)  Results for orders placed during the hospital encounter of 01/27/12  CBC      Component Value Range   WBC 17.5 (*) 4.0 - 10.5 K/uL   RBC 1.95 (*) 4.22 - 5.81 MIL/uL   Hemoglobin 6.9 (*) 13.0 - 17.0 g/dL   HCT 40.9 (*) 81.1 - 91.4 %   MCV 98.5  78.0 - 100.0 fL   MCH 35.4 (*)  26.0 - 34.0 pg   MCHC 35.9  30.0 - 36.0 g/dL   RDW 78.2 (*) 95.6 - 21.3 %   Platelets 285  150 - 400 K/uL  RETICULOCYTES      Component Value Range   Retic Ct Pct 31.8 (*) 0.4 - 3.1 %   RBC. 1.95 (*) 4.22 - 5.81 MIL/uL   Retic Count, Manual 620.1 (*) 19.0 - 186.0 K/uL   Dg Chest 2 View  01/27/2012  *RADIOLOGY REPORT*  Clinical Data: Rule sickle cell pain.  Sickle cell crisis.  CHEST - 2 VIEW  Comparison: 11/12/2011.  Findings: Cardiomegaly.  Chronic sickle cell changes of the chest. Small metallic object on the frontal view is projected anterior to the sternum on the lateral view.  Right IJ power port is similar.  No focal consolidation is identified.  No findings of acute chest syndrome.  Compared to the prior exam of 11/12/2011, no interval change. Cholecystectomy clips are present in the right upper quadrant.  IMPRESSION: Chronic sickle cell changes of the chest without acute cardiopulmonary disease.  Original Report Authenticated By: Andreas Newport, M.D.    IVFs, IV dilaudid, IM phenergan, PO benadryl.   0630 -MED c/s requested.  D/w [facility] hospitalist - will admit MDM   Nursing notes reviewed. Vital signs reviewed. Chest x-ray and labs obtained and reviewed as above. Patient requiring multiple doses of IV narcotics. Plan admit to sickle cell clinic.       Sunnie Nielsen, MD 01/27/12 [phone]

## 2012-01-27 NOTE — H&P (Signed)
[facility]                                                                                    Patient Demographics  Christopher Warren, is a 36 y.o. male  CSN: [id number]  MRN: [id number]  DOB - 02-02-1976  Admit Date - 01/27/2012  Outpatient Primary MD for the patient is [name], MD   With History of -  Past Medical History  Diagnosis Date  . Sickle cell anemia   . Asthma   . 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 Heart murmur       Past Surgical History  Procedure Date  . Cholecystectomy     in for   Chief Complaint  Patient presents with  . Sickle Cell Pain Crisis     HPI  Christopher Warren  is a 36 y.o. male, with history of sickle cell disease, iron overload presents to the hospital with chief complaints of constant, dull, nonradiating lower back pain ongoing for about pain with no associated symptoms no aggravating factors, relieved with rest, denies any history of trauma fever chills, denies any chest pain cough phlegm shortness of breath, denies any abdominal pain blood in stool or urine, denies any dysuria. He says that this pain is typical of a sickle cell crisis, in the ER his CBC was suggestive of anemia, I was called to admit the patient for sickle cell crisis.    Review of Systems    In addition to the HPI above,  No Fever-chills, No Headache, No changes with Vision or hearing, No problems swallowing food or Liquids, No Chest pain, Cough or Shortness of Breath, No Abdominal pain, No Nausea or Vommitting, Bowel movements are regular, No Blood in stool or Urine, No dysuria, No new skin rashes or bruises, No new joints pains-aches, except constant nonradiating lower back pain starting from yesterday which is typical of his sickle cell crisis, no aggravating factors better with pain medications, no other associated symptoms. No new weakness, tingling, numbness in any extremity, No recent weight gain or loss, No polyuria,  polydypsia or polyphagia, No significant Mental Stressors.  A full 10 point Review of Systems was done, except as stated above, all other Review of Systems were negative.   Social History History  Substance Use Topics  . Smoking status: Current Everyday Smoker  . Smokeless tobacco: Not on file  . Alcohol Use: No     Family History Family History  Problem Relation Age of Onset  . Sickle cell anemia Mother   . Sickle cell anemia Son      Prior to Admission medications   Medication Sig Start Date End Date Taking? Authorizing Provider  deferasirox (EXJADE) 125 MG disintegrating tablet Take 250 mg by mouth daily.    Yes Historical Provider, MD  folic acid (FOLVITE) 1 MG tablet Take 1 mg by mouth daily.     Yes Historical Provider, MD  HYDROmorphone (DILAUDID) 4 MG tablet Take 4 mg by mouth every 6 (six) hours as needed. For pain   Yes Historical Provider, MD  hydroxyurea (HYDREA) 500 MG capsule Take 500 mg by mouth 3 (three) times daily.  Yes Historical Provider, MD  diphenhydrAMINE (BENADRYL) 25 mg capsule Take 25 mg by mouth every 6 (six) hours as needed.    Historical Provider, MD    Allergies  Allergen Reactions  . Adhesive (Tape)   . Latex   . Morphine And Related Hives    Physical Exam  Vitals  Blood pressure 115/48, pulse 107, temperature 99.2 F (37.3 C), temperature source Oral, resp. rate 16, SpO2 93.00%.   1. General Young African American male sitting in bed in NAD,    2. Normal affect and insight, Not Suicidal or Homicidal, Awake Alert, Oriented X 3.  3. No F.N deficits, ALL C.Nerves Intact, Strength 5/5 all 4 extremities, Sensation intact all 4 extremities, Plantars down going.  4. Ears and Eyes appear Normal, Conjunctivae clear, PERRLA. Moist Oral Mucosa.  5. Supple Neck, No JVD, No cervical lymphadenopathy appriciated, No Carotid Bruits.  6. Symmetrical Chest wall movement, Good air movement bilaterally, CTAB.  7. fast RRR, No Gallops, Rubs or  Murmurs possible mitral valve click which according to patient is chronic, No Parasternal Heave.  8. Positive Bowel Sounds, Abdomen Soft, Non tender, No organomegaly appriciated,No rebound -guarding or rigidity.  9.  No Cyanosis, Normal Skin Turgor, No Skin Rash or Bruise.  10. Good muscle tone,  joints appear normal , no effusions, Normal ROM.  11. No Palpable Lymph Nodes in Neck or Axillae     Data Review  CBC  Lab 01/27/12 0451  WBC 17.5*  HGB 6.9*  HCT 19.2*  PLT 285  MCV 98.5  MCH 35.4*  MCHC 35.9  RDW 21.0*  LYMPHSABS --  MONOABS --  EOSABS --  BASOSABS --  BANDABS --   ------------------------------------------------------------------------------------------------------------------  Chemistries  No results found for this basename: NA:5,K:5,CL:5,CO2:5,GLUCOSE:5,BUN:5,CREATININE:5,GFRCGP,:5,CALCIUM:5,MG:5,AST:5,ALT:5,ALKPHOS:5,BILITOT:5 in the last 168 hours ------------------------------------------------------------------------------------------------------------------ CrCl is unknown because both a height and weight (above a minimum accepted value) are required for this calculation. ------------------------------------------------------------------------------------------------------------------ No results found for this basename: TSH,T4TOTAL,FREET3,T3FREE,THYROIDAB in the last 72 hours   Coagulation profile No results found for this basename: INR:5,PROTIME:5 in the last 168 hours ------------------------------------------------------------------------------------------------------------------- No results found for this basename: DDIMER:2 in the last 72 hours -------------------------------------------------------------------------------------------------------------------  Cardiac Enzymes No results found for this basename: CK:3,CKMB:3,TROPONINI:3,MYOGLOBIN:3 in the last 168  hours ------------------------------------------------------------------------------------------------------------------ No components found with this basename: POCBNP:3    Imaging results:   Dg Chest 2 View  01/27/2012  *RADIOLOGY REPORT*  Clinical Data: Rule sickle cell pain.  Sickle cell crisis.  CHEST - 2 VIEW  Comparison: 11/12/2011.  Findings: Cardiomegaly.  Chronic sickle cell changes of the chest. Small metallic object on the frontal view is projected anterior to the sternum on the lateral view.  Right IJ power port is similar.  No focal consolidation is identified.  No findings of acute chest syndrome.  Compared to the prior exam of 11/12/2011, no interval change. Cholecystectomy clips are present in the right upper quadrant.  IMPRESSION: Chronic sickle cell changes of the chest without acute cardiopulmonary disease.  Original Report Authenticated By: Andreas Newport, M.D.       Assessment & Plan   1. Lower back pain typical for patients sickle cell crisis-no radiation, no trauma, no weakness tingling numbness in any extremity-A. she does have history of sickle cell and this is typical of a sickle cell crisis according to him- he is noted to be anemic, will give him 2 units of packed RBC transfusion, IV fluids, pain control, and will inform Dr. Minerva Areola when he is available to take over  patient's care as per his request. We'll continue his Folic acid in and Hydrea.  I have also ordered a stat CMP, UA, urine drug screen which need to be followed up.    2. History of iron overload deferoxamine will be continued.    DVT Prophylaxis Heparin   AM Labs Ordered, also please review Full Orders  Family Communication: Admission, patients condition and plan of care including tests being ordered have been discussed with the patient and family member who indicate understanding and agree with the plan and Code Status.  Code Status full  Disposition Plan: home  Time spent in minutes :  35  Condition Marinell Blight K M.D on 01/27/2012 at 8:18 AM  Between 7am to 7pm - Pager - [phone]  After 7pm go to [email or web address] - password [email or web address]  And look for the night coverage person covering me after hours  [facility] Office  [phone]

## 2012-01-27 NOTE — ED Notes (Signed)
Pt came into the ED this am c/o back pain. Pt has dx of sickle cell. Pt states he wants to go to sickle cell clinic for tx.

## 2012-01-28 DIAGNOSIS — D571 Sickle-cell disease without crisis: Secondary | ICD-10-CM

## 2012-01-28 LAB — COMPREHENSIVE METABOLIC PANEL
ALT: 36 U/L (ref 0–53)
Alkaline Phosphatase: 92 U/L (ref 39–117)
BUN: 9 mg/dL (ref 6–23)
CO2: 25 mEq/L (ref 19–32)
GFR calc Af Amer: >90 mL/min (ref >90)
GFR calc non Af Amer: >90 mL/min (ref >90)
Glucose, Bld: 114 mg/dL — ABNORMAL HIGH (ref 70–99)
Potassium: 4.1 mEq/L (ref 3.5–5.1)
Total Bilirubin: 6.9 mg/dL — ABNORMAL HIGH (ref 0.3–1.2)
Total Protein: 6.9 g/dL (ref 6.0–8.3)

## 2012-01-28 LAB — CBC WITH DIFFERENTIAL/PLATELET
Basophils Relative: 1 % (ref 0–1)
Eosinophils Relative: 3 % (ref 0–5)
HCT: 18.8 % — ABNORMAL LOW (ref 39.0–52.0)
Hemoglobin: 6.9 g/dL — CL (ref 13.0–17.0)
Lymphs Abs: 3.7 10*3/uL (ref 0.7–4.0)
MCV: 91.3 fL (ref 78.0–100.0)
Monocytes Relative: 13 % — ABNORMAL HIGH (ref 3–12)
Neutro Abs: 9.5 10*3/uL — ABNORMAL HIGH (ref 1.7–7.7)
RBC: 2.06 MIL/uL — ABNORMAL LOW (ref 4.22–5.81)
WBC: 16 10*3/uL — ABNORMAL HIGH (ref 4.0–10.5)

## 2012-01-28 MED ORDER — NICOTINE 14 MG/24HR TD PT24
14.0000 mg | MEDICATED_PATCH | Freq: Every day | TRANSDERMAL | Status: DC
  Filled 2012-01-28: qty 1

## 2012-01-28 NOTE — Progress Notes (Signed)
Patient ID: Christopher Warren, male   DOB: 1976-03-31, 36 y.o.   MRN: [id number] Spoke with hospitalist(floor coverage) Lance Coon states pt will need to go AMA since he is not following MD's POC. Hospitalist wrote an order for nicoderm patch if pt changed his mind about use. This RN again spoke with patient and his family about his options of signing AMA or him staying to receive the services ordered by Dr. August Saucer. This RN offered recliner for one of family members if they chose to stay. Family left to go home and patient stayed to receive Desferal and get labs, nicoderm patch refused. Pt in NAD at this time.

## 2012-01-28 NOTE — Progress Notes (Signed)
Pt arrived from [facility] at 1145, pt immediately wanted to leave the floor with a friend which I explained to him was against policy for pt to leave the floor. Pt then said he need to leave, I explained that I would need to have Marcelino Duster the NP from the Clinic, come discharge him. She was called by myself and said she would be here within the hour, pt was not happy with that and called Dr Myna Hidalgo. Dr Myna Hidalgo did come to see patient and gave him the RX's that were written at the clinic and the pt left.

## 2012-01-28 NOTE — Progress Notes (Signed)
Patient ID: Christopher Warren, male   DOB: 30-Nov-1975, 36 y.o.   MRN: [id number] [name] was initially admitted to the [facility] for 23 hr observation.  At his time of d/c he was difficult to arouse and still in quite a bit of pain when alert. I felt he would benefit from another 23 hour observation especially in leu of reviewing labs. I reduced pain medication and he became difficult to tx after he was told he is not allowed to leave the floor. Nursing called me I agreed to d/c him after I exam him and felt he was stable also convey this to the pt. In the interim the pt. Contacted Dr. Myna Hidalgo  which is his primary  and ironically on call requesting to be d/c. Dr. Myna Hidalgo saw pt and d/c him. I called Dr. Myna Hidalgo and explained I had never seen pt and was there to d/c him. Dr. Myna Hidalgo agreed to do the d/c note.

## 2012-01-28 NOTE — Discharge Summary (Signed)
D/c summary # [id number]

## 2012-01-28 NOTE — H&P (Signed)
Patient ID: Christopher Warren, male   DOB: 07-21-1975, 36 y.o.   MRN: [id number]  Chief Complaint  Patient presents with  . Sickle Cell Pain Crisis    HPI Christopher Warren is a 36 y.o. male.  Patient is normally followed by Dr. Myna Hidalgo for hemoglobin SS disease complicated by iron overload. He has been receiving transfusions on a regular basis followed by deferoxamine infusion and Exjade at home. His last first and level was 4627 in July. He presented to the emergency room and was evaluated and subsequently admitted by the hospitalist.  He was subsequently transferred to the sickle cell unit. He presently rates his pain as a 10 out of 10. He notes the present medication is not controlling his pain.     Past Medical History  Diagnosis Date  . Sickle cell anemia   . Asthma   . 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 Heart murmur     Past Surgical History  Procedure Date  . Cholecystectomy     Family History  Problem Relation Age of Onset  . Sickle cell anemia Mother   . Sickle cell anemia Son     Social History History  Substance Use Topics  . Smoking status: Current Everyday Smoker    Types: Cigarettes  . Smokeless tobacco: Not on file  . Alcohol Use: No    Allergies  Allergen Reactions  . Morphine And Related Hives    Patient states can take Dilaudid with benadryl  . Adhesive (Tape)   . Latex   MEDICATION list reviewed from admission H&P.    Review of Systems As noted above. Blood pressure 110/42, pulse 89, temperature 98.6 F (37 C), temperature source Oral, resp. rate 18, SpO2 92.00%.  Physical Exam Well-developed slender black male agitated, complaining of pain all over. HEENT:  head normocephalic atraumatic. Positive sclera icterus. TMs are clear. Posterior pharynx clear. NECK: No enlarged thyroid. No posterior cervical nodes. LUNGS: Clear to auscultation. No vocal fremitus. CV: Normal S1, S2 without S3. 1/6 systolic ejection murmur in the second left  intercostal space. ABDOMEN: Soft nontender. EXTREMITIES: Negative Homans. No edema. NEURO: Nonfocal. Pressured speech. SKIN: Multiple tattoos.  Data Reviewed Laboratory data reviewed from admission and from his last visit with Hematology.   Assessment    Sickle cell crisis. Active hemolysis. Secondary hemachromatosis. Patient on chronic chelation therapy. Tobacco abuse. Marijuana use. Adjustment reaction.    Plan    Gave patient several treatment options. He agrees to stay for IV fluids, IV Dilaudid, IV Benadryl. He has received one unit packed RBCs. Deferoxamine 2 gm IV tonight. We'll give him a two-week supply of his Dilaudid pain medication with plans for followup.       Vaughn Beaumier 01/27/2012, 11:00 PM

## 2012-01-28 NOTE — Progress Notes (Signed)
Marcelino Duster NP from the [facility] called Dr Myna Hidalgo concerning discharge of pt, asked him to dictate Discharge for pt since he came to see pt and told him he could leave with RX's.

## 2012-01-28 NOTE — Progress Notes (Signed)
Report given to NP by phone. Orders given for admission / 23 hr observation. Explained to patient that he can stay as long as needed for management up until tomorrow morning. States he is sleepy because he's been awake so long, while he was in ED and then [facility]. Requests his medication for pain and agitation.

## 2012-01-29 NOTE — Discharge Summary (Signed)
NAMEMarland Kitchen  Christopher Warren, Christopher Warren NO.:  [id number]  MEDICAL RECORD NO.:  [id number]  LOCATION:  [id number]                         FACILITY:  [facility]  PHYSICIAN:  [name], M.D.  DATE OF BIRTH:  06-30-1975  DATE OF ADMISSION:  01/27/2012 DATE OF DISCHARGE:  01/28/2012                              DISCHARGE SUMMARY   DIAGNOSES UPON DISCHARGE: 1. Sickle cell crisis. 2. Iron overload.  CONDITION ON DISCHARGE:  Fair.  ACTIVITIES:  The patient does what he desires to do.  DIET:  No restrictions.  FOLLOWUP:  The patient to go to the [facility] for followup, which he often has not done.  He can call [facility] for issues.  MEDICATIONS UPON DISCHARGE:  Dilaudid 4 mg q.4-6 hours p.r.n., Exjade 1000 mg p.o. daily, folic acid 1 mg p.o. daily, Hydrea 500 mg p.o. t.i.d.  HOSPITAL COURSE:  Christopher Warren was admitted to the [facility]. He is to have crisis.  Unfortunately, once he gets into the hospital, he is quite belligerent.  He wanted his way and if he cannot have it, he tends to make life very difficult for those who are trying to take care of him.  He just does not understand that there are hospital rules that he just cannot break.  I suspect that he likely was not compliant with his medications.  His last ferritin in July was 4600.  When he came in, his white cell count is 17.5, hemoglobin is 6.9, hematocrit 19.2, platelet count 285.  We finally did get a Port-A-Cath in him.  We will try to do exchange transfusions on him.  Again, he has not been compliant in the office with follow up.  Preoperatively, I think he was transfused with 1 unit of blood.  Again, because he could not do what he would like (i.e. smokes and go outside).  He wanted to go home.  He got a ride to pick him up.  He states he still has some discomfort.  However, he does not want to stay in the hospital.  Again every time he comes into the hospital,  he has a hard time understanding that there are hospital policies that he cannot circumvent.  PHYSICAL EXAMINATION:  VITAL SIGNS:  Upon discharge showed temperature 98.7, pulse 109, respiratory rate 18, blood pressure 134/76.  HEAD AND NECK:  There is some slight scleral icterus.  There is no oral lesions. He has no adenopathy in his neck.  LUNGS:  Clear bilaterally.  CARDIAC: Tachycardia, but regular.  There are no murmurs, rubs, or bruits.  ABDOMEN:  Soft.  He has no fluid wave.  There is no palpable abdominal mass.  There is no palpable hepatosplenomegaly. EXTREMITIES:  No clubbing, cyanosis, or edema.  There is good range of motion of his joints.  NEUROLOGICAL:  No focal or neurological deficits.     [name], M.D.     PRE/MEDQ  D:  01/28/2012  T:  01/29/2012  Job:  [id number]

## 2012-01-31 LAB — TYPE AND SCREEN
Antibody Screen: NEGATIVE
Unit division: 0

## 2012-02-08 ENCOUNTER — Telehealth: Payer: Self-pay | Admitting: *Deleted

## 2012-02-08 NOTE — Telephone Encounter (Signed)
Pt called asking if he could be seen Friday by Dr. Myna Hidalgo and get his pain medication and a CBC.  Told Dr. Myna Hidalgo was not in the office on Friday but he could make an appt for next week.  Pt asked again about getting his pain medication and was told that Dr. Myna Hidalgo said it was too early for another rx for pain medication.  Pt scheduled for visit next week.

## 2012-02-09 ENCOUNTER — Telehealth: Payer: Self-pay | Admitting: Hematology & Oncology

## 2012-02-09 LAB — POCT URINALYSIS DIP (DEVICE)
Glucose, UA: NEGATIVE mg/dL
Leukocytes, UA: NEGATIVE
Nitrite: NEGATIVE
Protein, ur: NEGATIVE mg/dL
Specific Gravity, Urine: 1.01 (ref 1.005–1.030)
Urobilinogen, UA: 0.2 mg/dL (ref 0.0–1.0)

## 2012-02-09 NOTE — Telephone Encounter (Signed)
Pt aware of 9-3 appointment

## 2012-02-14 ENCOUNTER — Ambulatory Visit (HOSPITAL_BASED_OUTPATIENT_CLINIC_OR_DEPARTMENT_OTHER): Payer: Medicaid Other | Admitting: Hematology & Oncology

## 2012-02-14 ENCOUNTER — Encounter (HOSPITAL_COMMUNITY)
Admission: RE | Admit: 2012-02-14 | Discharge: 2012-02-14 | Disposition: A | Discharge status: Home / Self Care | Payer: Medicaid Other | Source: Ambulatory Visit | Attending: Hematology & Oncology | Admitting: Hematology & Oncology

## 2012-02-14 ENCOUNTER — Other Ambulatory Visit (HOSPITAL_BASED_OUTPATIENT_CLINIC_OR_DEPARTMENT_OTHER): Payer: Medicaid Other | Admitting: Lab

## 2012-02-14 ENCOUNTER — Other Ambulatory Visit: Payer: Self-pay | Admitting: Medical Oncology

## 2012-02-14 VITALS — BP 112/50 | HR 89 | Temp 98.7°F | Resp 20 | Ht 65.0 in | Wt 127.0 lb

## 2012-02-14 DIAGNOSIS — R52 Pain, unspecified: Secondary | ICD-10-CM

## 2012-02-14 DIAGNOSIS — D5700 Hb-SS disease with crisis, unspecified: Secondary | ICD-10-CM

## 2012-02-14 DIAGNOSIS — D571 Sickle-cell disease without crisis: Secondary | ICD-10-CM

## 2012-02-14 DIAGNOSIS — D509 Iron deficiency anemia, unspecified: Secondary | ICD-10-CM

## 2012-02-14 DIAGNOSIS — L298 Other pruritus: Secondary | ICD-10-CM

## 2012-02-14 DIAGNOSIS — F17200 Nicotine dependence, unspecified, uncomplicated: Secondary | ICD-10-CM

## 2012-02-14 DIAGNOSIS — R110 Nausea: Secondary | ICD-10-CM

## 2012-02-14 LAB — CBC WITH DIFFERENTIAL (CANCER CENTER ONLY)
BASO%: 0.4 % (ref 0.0–2.0)
Eosinophils Absolute: 0.3 10*3/uL (ref 0.0–0.5)
LYMPH#: 6.2 10*3/uL — ABNORMAL HIGH (ref 0.9–3.3)
LYMPH%: 31.1 % (ref 14.0–48.0)
MONO#: 2.2 10*3/uL — ABNORMAL HIGH (ref 0.1–0.9)
NEUT#: 11.1 10*3/uL — ABNORMAL HIGH (ref 1.5–6.5)
Platelets: 324 10*3/uL (ref 145–400)
RBC: 1.83 10*6/uL — ABNORMAL LOW (ref 4.20–5.70)
RDW: 21 % — ABNORMAL HIGH (ref 11.1–15.7)
WBC: 19.8 10*3/uL — ABNORMAL HIGH (ref 4.0–10.0)

## 2012-02-14 LAB — CMP (CANCER CENTER ONLY)
ALT(SGPT): 34 U/L (ref 10–47)
AST: 47 U/L — ABNORMAL HIGH (ref 11–38)
BUN, Bld: 7 mg/dL (ref 7–22)
CO2: 27 mEq/L (ref 18–33)
Calcium: 8.2 mg/dL (ref 8.0–10.3)
Chloride: 105 mEq/L (ref 98–108)
Creat: 0.6 mg/dl (ref 0.6–1.2)
Total Bilirubin: 5.3 mg/dl — ABNORMAL HIGH (ref 0.20–1.60)

## 2012-02-14 LAB — IRON AND TIBC
Iron: 169 ug/dL — ABNORMAL HIGH (ref 42–165)
UIBC: <15 ug/dL — ABNORMAL LOW (ref 125–400)

## 2012-02-14 LAB — FERRITIN: Ferritin: 4212 ng/mL — ABNORMAL HIGH (ref 22–322)

## 2012-02-14 LAB — HOLD TUBE, BLOOD BANK - CHCC SATELLITE

## 2012-02-14 MED ORDER — WARFARIN SODIUM 2 MG PO TABS: 2.0000 mg | ORAL_TABLET | Freq: Every day | ORAL | Status: DC

## 2012-02-14 MED ORDER — PROMETHAZINE HCL 25 MG PO TABS: 25.0000 mg | ORAL_TABLET | Freq: Three times a day (TID) | ORAL | Status: DC | PRN

## 2012-02-14 MED ORDER — ALBUTEROL SULFATE HFA 108 (90 BASE) MCG/ACT IN AERS: 2.0000 | INHALATION_SPRAY | Freq: Four times a day (QID) | RESPIRATORY_TRACT | Status: AC | PRN

## 2012-02-14 MED ORDER — HYDROMORPHONE HCL 4 MG PO TABS: 4.0000 mg | ORAL_TABLET | Freq: Four times a day (QID) | ORAL | Status: DC | PRN

## 2012-02-14 MED ORDER — HYDROMORPHONE HCL ER 12 MG PO T24A: 12.0000 mg | EXTENDED_RELEASE_TABLET | ORAL | Status: DC

## 2012-02-14 NOTE — Addendum Note (Signed)
Addended by: Lacie Draft on: 02/14/2012 03:43 PM   Modules accepted: Orders, SmartSet

## 2012-02-14 NOTE — Progress Notes (Signed)
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  CURRENT THERAPY: 1. Hydrea 500 mg p.o. t.i.d. 2. Exjade 1000 mg p.o. daily. 3. Folic acid 1 mg p.o. daily.  INTERIM HISTORY:  [name] comes in for a visit.  He has been feeling tired.  He usually comes to the office when he needs to be exchanged.  He was hospitalized a month ago.  He had sickle cell crisis. He just does not do well when he is in the hospital as he is not happy with the hospital rules.  He is still smoking.  I finally got him to take time-released pain medication.  I got him take Exalgo.  This is time-released Dilaudid.  I will put him on 12-mg-a-day dose.  It is unclear to me whether not he is taking the Exjade.  I suspect that he is taking the Hydrea as his MCV is 102.  He has had no fever.  He has had a little bit of a cough.  He has had no nausea or vomiting.  He has had no leg swelling.  PHYSICAL EXAMINATION:  This is a fairly petite black gentleman in no obvious distress.  Vital signs:  Temperature of 98.7, pulse 89, respiratory rate 20, blood pressure 112/50.  Weight is 127.  Head and neck:  Normocephalic, atraumatic skull.  He has some scleral icterus. He has no adenopathy in her neck.  Lungs:  Clear bilaterally.  Cardiac: Regular rate and rhythm with a normal S1 and S2.  There are no murmurs, rubs or bruits.  Abdomen:  Soft with good bowel sounds.  There is no palpable abdominal mass.  There is no fluid wave.  There is no palpable hepatosplenomegaly.  Back:  No tenderness over the spine, ribs, or hips. Extremities:  Shows no clubbing, cyanosis or edema.  Neurologic:  No focal neurological deficits.  LABORATORIES STUDIES:  White blood cell count 19.8, hemoglobin 6.6, hematocrit 18.7, platelet count 324.  IMPRESSION:  Christopher Warren is a 36 year old African American gentleman with hemoglobin SS disease.  He has a fairly aggressive course of this.  I do think  that exchange is indicated.  We will see about trying to do an exchange daily for the next 3 days.  I will try take out 1 unit and give him back 2.  This typically helps to "break the sickle hemolysis cycle."  I hope that the Exalgo will help with his pain issues.  His last ferritin was still incredibly high.  Again, I just wonder if he is taking the Exjade or not.  There is really no way to know this.  We last checked his ferritin 2 months going and it was 4600.  We will try to get him back to see [place] in a month.    ______________________________ Christopher Warren, M.D. PRE/MEDQ  D:  02/14/2012  T:  02/14/2012  Job:  [id number]

## 2012-02-14 NOTE — Progress Notes (Signed)
This office note has been dictated.

## 2012-02-15 ENCOUNTER — Ambulatory Visit (HOSPITAL_BASED_OUTPATIENT_CLINIC_OR_DEPARTMENT_OTHER): Payer: Medicaid Other

## 2012-02-15 DIAGNOSIS — D571 Sickle-cell disease without crisis: Secondary | ICD-10-CM

## 2012-02-15 LAB — PREPARE RBC (CROSSMATCH)

## 2012-02-15 MED ORDER — SODIUM CHLORIDE 0.9 % IJ SOLN
10.0000 mL | INTRAMUSCULAR | Status: AC | PRN
  Administered 2012-02-15: 10 mL
  Filled 2012-02-15: qty 10

## 2012-02-15 MED ORDER — SODIUM CHLORIDE 0.9 % IV SOLN
250.0000 mL | Freq: Once | INTRAVENOUS | Status: AC
  Administered 2012-02-15: 250 mL via INTRAVENOUS

## 2012-02-15 MED ORDER — DIPHENHYDRAMINE HCL 50 MG/ML IJ SOLN: 25.0000 mg | Freq: Once | INTRAMUSCULAR | Status: DC

## 2012-02-15 MED ORDER — HYDROMORPHONE HCL PF 4 MG/ML IJ SOLN
2.0000 mg | Freq: Once | INTRAMUSCULAR | Status: AC
  Administered 2012-02-15: 2 mg via INTRAVENOUS

## 2012-02-15 MED ORDER — HEPARIN SOD (PORK) LOCK FLUSH 100 UNIT/ML IV SOLN
250.0000 [IU] | INTRAVENOUS | Status: AC | PRN
  Administered 2012-02-15: 500 [IU]
  Filled 2012-02-15: qty 5

## 2012-02-15 MED ORDER — FUROSEMIDE 10 MG/ML IJ SOLN
20.0000 mg | Freq: Once | INTRAMUSCULAR | Status: AC
  Administered 2012-02-15: 20 mg via INTRAVENOUS

## 2012-02-15 MED ORDER — ACETAMINOPHEN 325 MG PO TABS
650.0000 mg | ORAL_TABLET | Freq: Once | ORAL | Status: AC
  Administered 2012-02-15: 650 mg via ORAL

## 2012-02-15 MED ORDER — DIPHENHYDRAMINE HCL 25 MG PO CAPS
25.0000 mg | ORAL_CAPSULE | Freq: Once | ORAL | Status: AC
  Administered 2012-02-15: 25 mg via ORAL

## 2012-02-15 MED ORDER — HYDROMORPHONE HCL PF 4 MG/ML IJ SOLN
4.0000 mg | INTRAMUSCULAR | Status: DC | PRN
  Administered 2012-02-15: 4 mg via INTRAVENOUS

## 2012-02-15 NOTE — Patient Instructions (Signed)
Therapeutic Phlebotomy Care After Refer to this sheet in the next few weeks. These instructions provide you with information on caring for yourself after your procedure. Your caregiver may also give you more specific instructions. Your treatment has been planned according to current medical practices, but problems sometimes occur. Call your caregiver if you have any problems or questions after your procedure. HOME CARE INSTRUCTIONS Most people can go back to their normal activities right away. Before you leave, be sure to ask if there is anything you should or should not do. In general, it would be [place] to:  Keep the bandage dry. You can remove the bandage after about 5 hours.   Eat well-balanced meals for the next 24 hours.   Drink enough fluids to keep your urine clear or pale yellow.   Avoid drinking alcohol minimally until after eating.   Avoid smoking for at least 30 minutes after the procedure.   Avoid strenous physical activity or heavy lifting or pulling for about 5 hours after the procedure.   Athletes should avoid strenous exercise for 12 hours or more.   Change positions slowly for the remainder of the day to prevent lightheadedness or fainting.   If you feel lightheaded, lie down until the feeling subsides.   If you have bleeding from the needle insertion site, elevate your arm and press firmly on the site until the bleeding stops.   If bruising or bleeding appears under the skin, apply ice to the area for 15 to 20 minutes, 3 to 4 times per day. Put the ice in a plastic bag and place a towel between the bag of ice and your skin. Do this while you are awake for the first 24 hours. The ice packs can be stopped before 24 hours if the swelling goes away. If swelling persists after 24 hours, a warm, moist washcloth can be applied to the area for 15 to 20 minutes, 3 to 4 times per day. The warm, moist treatments can be stopped when the swelling goes away.   It is important to  continue further therapeutic phlebotomy as directed by your caregiver.  SEEK MEDICAL CARE IF:  There is bleeding or fluid leaking from the needle insertion site.   The needle insertion site becomes swollen, red, or sore.   You feel lightheaded, dizzy or nauseated, and the feeling does not go away.   You notice new bruising at the needle insertion site.   You feel more weak or tired than normal.   You develop a fever.  SEEK IMMEDIATE MEDICAL CARE IF:   There is increased bleeding, pain, or swelling from the needle insertion site.   You have severe nausea or vomiting.   You have chest pain.   You have trouble breathing.  MAKE SURE YOU:  Understand these instructions.   Will watch your condition.   Will get help right away if you are not doing well or get worse.  Document Released: 11/01/2010 Document Revised: 05/19/2011 Document Reviewed: 11/01/2010 [facility] Patient Information 2012 [place], [place].Blood Transfusion  A blood transfusion replaces your blood or some of its parts. Blood is replaced when you have lost blood because of surgery, an accident, or for severe blood conditions like anemia. You can donate blood to be used on yourself if you have a planned surgery. If you lose blood during that surgery, your own blood can be given back to you. Any blood given to you is checked to make sure it matches your blood type.  Your temperature, blood pressure, and heart rate (vital signs) will be checked often.  GET HELP RIGHT AWAY IF:   You feel sick to your stomach (nauseous) or throw up (vomit).   You have watery poop (diarrhea).   You have shortness of breath or trouble breathing.   You have blood in your pee (urine) or have dark colored pee.   You have chest pain or tightness.   Your eyes or skin turn yellow (jaundice).   You have a temperature by mouth above 102 F (38.9 C), not controlled by medicine.   You start to shake and have chills.   You develop a a red  rash (hives) or feel itchy.   You develop lightheadedness or feel confused.   You develop back, joint, or muscle pain.   You do not feel hungry (lost appetite).   You feel tired, restless, or nervous.   You develop belly (abdominal) cramps.  Document Released: 08/26/2008 Document Revised: 05/19/2011 Document Reviewed: 08/26/2008 [facility] Patient Information 2012 [place], [place].

## 2012-02-15 NOTE — Progress Notes (Signed)
Christopher Warren presents today for phlebotomy per MD orders. Phlebotomy procedure started at 0945 and ended at 1015. Approximately removed. Patient observed for 30 minutes after procedure without any incident. Patient tolerated procedure well. Stressed to patient the importance of keeping blood bracelet on.  Verbalized understanding.  First unit of two started.  [name], [name] [employer]

## 2012-02-16 ENCOUNTER — Other Ambulatory Visit: Payer: Self-pay | Admitting: Hematology & Oncology

## 2012-02-16 ENCOUNTER — Ambulatory Visit (HOSPITAL_BASED_OUTPATIENT_CLINIC_OR_DEPARTMENT_OTHER): Payer: Medicaid Other

## 2012-02-16 VITALS — BP 105/49 | HR 59 | Temp 97.4°F | Resp 18

## 2012-02-16 DIAGNOSIS — D571 Sickle-cell disease without crisis: Secondary | ICD-10-CM

## 2012-02-16 DIAGNOSIS — D5700 Hb-SS disease with crisis, unspecified: Secondary | ICD-10-CM

## 2012-02-16 DIAGNOSIS — S0500XA Injury of conjunctiva and corneal abrasion without foreign body, unspecified eye, initial encounter: Secondary | ICD-10-CM

## 2012-02-16 MED ORDER — SODIUM CHLORIDE 0.9 % IV SOLN
250.0000 mL | Freq: Once | INTRAVENOUS | Status: AC
  Administered 2012-02-16: 250 mL via INTRAVENOUS

## 2012-02-16 MED ORDER — PREDNISOLONE ACETATE 1 % OP SUSP: 1.0000 [drp] | Freq: Four times a day (QID) | OPHTHALMIC | Status: AC

## 2012-02-16 MED ORDER — DIPHENHYDRAMINE HCL 25 MG PO CAPS
25.0000 mg | ORAL_CAPSULE | Freq: Once | ORAL | Status: AC
  Administered 2012-02-16: 25 mg via ORAL

## 2012-02-16 MED ORDER — SODIUM CHLORIDE 0.9 % IJ SOLN
10.0000 mL | INTRAMUSCULAR | Status: AC | PRN
  Administered 2012-02-16: 10 mL
  Filled 2012-02-16: qty 10

## 2012-02-16 MED ORDER — HYDROMORPHONE HCL PF 1 MG/ML IJ SOLN
2.0000 mg | INTRAMUSCULAR | Status: DC | PRN
  Administered 2012-02-16 (×2): 2 mg via INTRAVENOUS
  Filled 2012-02-16: qty 2

## 2012-02-16 MED ORDER — ACETAMINOPHEN 325 MG PO TABS
650.0000 mg | ORAL_TABLET | Freq: Once | ORAL | Status: AC
  Administered 2012-02-16: 650 mg via ORAL

## 2012-02-16 MED ORDER — HEPARIN SOD (PORK) LOCK FLUSH 100 UNIT/ML IV SOLN
500.0000 [IU] | Freq: Every day | INTRAVENOUS | Status: AC | PRN
  Administered 2012-02-16: 500 [IU]
  Filled 2012-02-16: qty 5

## 2012-02-16 MED ORDER — FUROSEMIDE 10 MG/ML IJ SOLN
20.0000 mg | Freq: Once | INTRAMUSCULAR | Status: AC
  Administered 2012-02-16: 20 mg via INTRAVENOUS

## 2012-02-16 NOTE — Progress Notes (Signed)
Christopher Warren presents today for phlebotomy per MD orders. Phlebotomy procedure started at 0915 and ended at 0930. 500 ml removed. Patient observed for 30 minutes after procedure without any incident. Patient tolerated procedure well. IV needle removed intact.

## 2012-02-16 NOTE — Patient Instructions (Signed)
Therapeutic Phlebotomy Care After Refer to this sheet in the next few weeks. These instructions provide you with information on caring for yourself after your procedure. Your caregiver may also give you more specific instructions. Your treatment has been planned according to current medical practices, but problems sometimes occur. Call your caregiver if you have any problems or questions after your procedure. HOME CARE INSTRUCTIONS Most people can go back to their normal activities right away. Before you leave, be sure to ask if there is anything you should or should not do. In general, it would be [place] to:  Keep the bandage dry. You can remove the bandage after about 5 hours.   Eat well-balanced meals for the next 24 hours.   Drink enough fluids to keep your urine clear or pale yellow.   Avoid drinking alcohol minimally until after eating.   Avoid smoking for at least 30 minutes after the procedure.   Avoid strenous physical activity or heavy lifting or pulling for about 5 hours after the procedure.   Athletes should avoid strenous exercise for 12 hours or more.   Change positions slowly for the remainder of the day to prevent lightheadedness or fainting.   If you feel lightheaded, lie down until the feeling subsides.   If you have bleeding from the needle insertion site, elevate your arm and press firmly on the site until the bleeding stops.   If bruising or bleeding appears under the skin, apply ice to the area for 15 to 20 minutes, 3 to 4 times per day. Put the ice in a plastic bag and place a towel between the bag of ice and your skin. Do this while you are awake for the first 24 hours. The ice packs can be stopped before 24 hours if the swelling goes away. If swelling persists after 24 hours, a warm, moist washcloth can be applied to the area for 15 to 20 minutes, 3 to 4 times per day. The warm, moist treatments can be stopped when the swelling goes away.   It is important to  continue further therapeutic phlebotomy as directed by your caregiver.  SEEK MEDICAL CARE IF:  There is bleeding or fluid leaking from the needle insertion site.   The needle insertion site becomes swollen, red, or sore.   You feel lightheaded, dizzy or nauseated, and the feeling does not go away.   You notice new bruising at the needle insertion site.   You feel more weak or tired than normal.   You develop a fever.  SEEK IMMEDIATE MEDICAL CARE IF:   There is increased bleeding, pain, or swelling from the needle insertion site.   You have severe nausea or vomiting.   You have chest pain.   You have trouble breathing.  MAKE SURE YOU:  Understand these instructions.   Will watch your condition.   Will get help right away if you are not doing well or get worse.  Document Released: 11/01/2010 Document Revised: 05/19/2011 Document Reviewed: 11/01/2010 [facility] Patient Information 2012 [place], [place].Blood Transfusion  A blood transfusion replaces your blood or some of its parts. Blood is replaced when you have lost blood because of surgery, an accident, or for severe blood conditions like anemia. You can donate blood to be used on yourself if you have a planned surgery. If you lose blood during that surgery, your own blood can be given back to you. Any blood given to you is checked to make sure it matches your blood type.  Your temperature, blood pressure, and heart rate (vital signs) will be checked often.  GET HELP RIGHT AWAY IF:   You feel sick to your stomach (nauseous) or throw up (vomit).   You have watery poop (diarrhea).   You have shortness of breath or trouble breathing.   You have blood in your pee (urine) or have dark colored pee.   You have chest pain or tightness.   Your eyes or skin turn yellow (jaundice).   You have a temperature by mouth above 102 F (38.9 C), not controlled by medicine.   You start to shake and have chills.   You develop a a red  rash (hives) or feel itchy.   You develop lightheadedness or feel confused.   You develop back, joint, or muscle pain.   You do not feel hungry (lost appetite).   You feel tired, restless, or nervous.   You develop belly (abdominal) cramps.  Document Released: 08/26/2008 Document Revised: 05/19/2011 Document Reviewed: 08/26/2008      Blood Transfusion  A blood transfusion replaces your blood or some of its parts. Blood is replaced when you have lost blood because of surgery, an accident, or for severe blood conditions like anemia. You can donate blood to be used on yourself if you have a planned surgery. If you lose blood during that surgery, your own blood can be given back to you. Any blood given to you is checked to make sure it matches your blood type. Your temperature, blood pressure, and heart rate (vital signs) will be checked often.  GET HELP RIGHT AWAY IF:   You feel sick to your stomach (nauseous) or throw up (vomit).   You have watery poop (diarrhea).   You have shortness of breath or trouble breathing.   You have blood in your pee (urine) or have dark colored pee.   You have chest pain or tightness.   Your eyes or skin turn yellow (jaundice).   You have a temperature by mouth above 102 F (38.9 C), not controlled by medicine.   You start to shake and have chills.   You develop a a red rash (hives) or feel itchy.   You develop lightheadedness or feel confused.   You develop back, joint, or muscle pain.   You do not feel hungry (lost appetite).   You feel tired, restless, or nervous.   You develop belly (abdominal) cramps.  Document Released: 08/26/2008 Document Revised: 05/19/2011 Document Reviewed: 08/26/2008 [facility] [place] Patient Information 2012 [place], [place]. [employer] Patient Information 2012 [place], [place].

## 2012-02-17 ENCOUNTER — Ambulatory Visit (HOSPITAL_BASED_OUTPATIENT_CLINIC_OR_DEPARTMENT_OTHER): Payer: Medicaid Other

## 2012-02-17 VITALS — BP 95/46 | HR 58 | Temp 98.3°F | Resp 16

## 2012-02-17 DIAGNOSIS — D5700 Hb-SS disease with crisis, unspecified: Secondary | ICD-10-CM

## 2012-02-17 DIAGNOSIS — D571 Sickle-cell disease without crisis: Secondary | ICD-10-CM

## 2012-02-17 MED ORDER — HYDROMORPHONE HCL PF 4 MG/ML IJ SOLN
2.0000 mg | INTRAMUSCULAR | Status: DC | PRN
  Administered 2012-02-17 (×2): 2 mg via INTRAVENOUS

## 2012-02-17 MED ORDER — SODIUM CHLORIDE 0.9 % IJ SOLN
10.0000 mL | INTRAMUSCULAR | Status: AC | PRN
  Administered 2012-02-17: 10 mL
  Filled 2012-02-17: qty 10

## 2012-02-17 MED ORDER — DIPHENHYDRAMINE HCL 25 MG PO CAPS
25.0000 mg | ORAL_CAPSULE | Freq: Once | ORAL | Status: AC
  Administered 2012-02-17: 25 mg via ORAL

## 2012-02-17 MED ORDER — HEPARIN SOD (PORK) LOCK FLUSH 100 UNIT/ML IV SOLN
500.0000 [IU] | Freq: Every day | INTRAVENOUS | Status: AC | PRN
  Administered 2012-02-17: 500 [IU]
  Filled 2012-02-17: qty 5

## 2012-02-17 MED ORDER — FUROSEMIDE 10 MG/ML IJ SOLN
20.0000 mg | Freq: Once | INTRAMUSCULAR | Status: AC
  Administered 2012-02-17: 20 mg via INTRAVENOUS

## 2012-02-17 MED ORDER — ACETAMINOPHEN 325 MG PO TABS
650.0000 mg | ORAL_TABLET | Freq: Once | ORAL | Status: AC
  Administered 2012-02-17: 650 mg via ORAL

## 2012-02-17 MED ORDER — PROMETHAZINE HCL 25 MG/ML IJ SOLN
25.0000 mg | Freq: Once | INTRAMUSCULAR | Status: AC
  Administered 2012-02-17: 25 mg via INTRAVENOUS

## 2012-02-17 MED ORDER — SODIUM CHLORIDE 0.9 % IV SOLN
250.0000 mL | Freq: Once | INTRAVENOUS | Status: AC
  Administered 2012-02-17: 250 mL via INTRAVENOUS

## 2012-02-17 NOTE — Patient Instructions (Signed)
Therapeutic Phlebotomy Care After Refer to this sheet in the next few weeks. These instructions provide you with information on caring for yourself after your procedure. Your caregiver may also give you more specific instructions. Your treatment has been planned according to current medical practices, but problems sometimes occur. Call your caregiver if you have any problems or questions after your procedure. HOME CARE INSTRUCTIONS Most people can go back to their normal activities right away. Before you leave, be sure to ask if there is anything you should or should not do. In general, it would be wise to:  Keep the bandage dry. You can remove the bandage after about 5 hours.   Eat well-balanced meals for the next 24 hours.   Drink enough fluids to keep your urine clear or pale yellow.   Avoid drinking alcohol minimally until after eating.   Avoid smoking for at least 30 minutes after the procedure.   Avoid strenous physical activity or heavy lifting or pulling for about 5 hours after the procedure.   Athletes should avoid strenous exercise for 12 hours or more.   Change positions slowly for the remainder of the day to prevent lightheadedness or fainting.   If you feel lightheaded, lie down until the feeling subsides.   If you have bleeding from the needle insertion site, elevate your arm and press firmly on the site until the bleeding stops.   If bruising or bleeding appears under the skin, apply ice to the area for 15 to 20 minutes, 3 to 4 times per day. Put the ice in a plastic bag and place a towel between the bag of ice and your skin. Do this while you are awake for the first 24 hours. The ice packs can be stopped before 24 hours if the swelling goes away. If swelling persists after 24 hours, a warm, moist washcloth can be applied to the area for 15 to 20 minutes, 3 to 4 times per day. The warm, moist treatments can be stopped when the swelling goes away.   It is important to  continue further therapeutic phlebotomy as directed by your caregiver.  SEEK MEDICAL CARE IF:  There is bleeding or fluid leaking from the needle insertion site.   The needle insertion site becomes swollen, red, or sore.   You feel lightheaded, dizzy or nauseated, and the feeling does not go away.   You notice new bruising at the needle insertion site.   You feel more weak or tired than normal.   You develop a fever.  SEEK IMMEDIATE MEDICAL CARE IF:   There is increased bleeding, pain, or swelling from the needle insertion site.   You have severe nausea or vomiting.   You have chest pain.   You have trouble breathing.  MAKE SURE YOU:  Understand these instructions.   Will watch your condition.   Will get help right away if you are not doing well or get worse.  Document Released: 11/01/2010 Document Revised: 05/19/2011 Document Reviewed: 11/01/2010 [employer] Patient Information 2012 [employer].Blood Transfusion  A blood transfusion replaces your blood or some of its parts. Blood is replaced when you have lost blood because of surgery, an accident, or for severe blood conditions like anemia. You can donate blood to be used on yourself if you have a planned surgery. If you lose blood during that surgery, your own blood can be given back to you. Any blood given to you is checked to make sure it matches your blood type.   Your temperature, blood pressure, and heart rate (vital signs) will be checked often.  GET HELP RIGHT AWAY IF:   You feel sick to your stomach (nauseous) or throw up (vomit).   You have watery poop (diarrhea).   You have shortness of breath or trouble breathing.   You have blood in your pee (urine) or have dark colored pee.   You have chest pain or tightness.   Your eyes or skin turn yellow (jaundice).   You have a temperature by mouth above 102 F (38.9 C), not controlled by medicine.   You start to shake and have chills.   You develop a a red  rash (hives) or feel itchy.   You develop lightheadedness or feel confused.   You develop back, joint, or muscle pain.   You do not feel hungry (lost appetite).   You feel tired, restless, or nervous.   You develop belly (abdominal) cramps.  Document Released: 08/26/2008 Document Revised: 05/19/2011 Document Reviewed: 08/26/2008 [employer] Patient Information 2012 [employer]. 

## 2012-02-17 NOTE — Progress Notes (Signed)
Christopher Warren presents today for phlebotomy per MD orders. Phlebotomy procedure started at 0920 and ended at 0941. 500 mls  removed. Patient observed for 30 minutes after procedure without any incident. Patient tolerated procedure well. Patient to receive 2 units PRBCs. [name], Trenika Hudson [employer]

## 2012-02-19 LAB — TYPE AND SCREEN
Unit division: 0
Unit division: 0
Unit division: 0
Unit division: 0

## 2012-03-13 ENCOUNTER — Encounter (HOSPITAL_COMMUNITY)
Admission: RE | Admit: 2012-03-13 | Discharge: 2012-03-13 | Disposition: A | Discharge status: Home / Self Care | Payer: Medicaid Other | Source: Ambulatory Visit | Attending: Hematology & Oncology | Admitting: Hematology & Oncology

## 2012-03-13 DIAGNOSIS — D571 Sickle-cell disease without crisis: Secondary | ICD-10-CM | POA: Insufficient documentation

## 2012-03-15 ENCOUNTER — Ambulatory Visit (HOSPITAL_BASED_OUTPATIENT_CLINIC_OR_DEPARTMENT_OTHER): Payer: Medicaid Other

## 2012-03-15 ENCOUNTER — Other Ambulatory Visit: Payer: Self-pay | Admitting: Hematology & Oncology

## 2012-03-15 ENCOUNTER — Ambulatory Visit: Payer: Medicaid Other | Admitting: Lab

## 2012-03-15 ENCOUNTER — Other Ambulatory Visit: Payer: Self-pay | Admitting: *Deleted

## 2012-03-15 ENCOUNTER — Ambulatory Visit (HOSPITAL_BASED_OUTPATIENT_CLINIC_OR_DEPARTMENT_OTHER)
Admission: RE | Admit: 2012-03-15 | Discharge: 2012-03-15 | Disposition: A | Discharge status: Home / Self Care | Payer: Medicaid Other | Source: Ambulatory Visit | Attending: Hematology & Oncology | Admitting: Hematology & Oncology

## 2012-03-15 ENCOUNTER — Ambulatory Visit (HOSPITAL_BASED_OUTPATIENT_CLINIC_OR_DEPARTMENT_OTHER): Payer: Medicaid Other | Admitting: Hematology & Oncology

## 2012-03-15 DIAGNOSIS — D571 Sickle-cell disease without crisis: Secondary | ICD-10-CM

## 2012-03-15 DIAGNOSIS — R05 Cough: Secondary | ICD-10-CM | POA: Insufficient documentation

## 2012-03-15 DIAGNOSIS — R059 Cough, unspecified: Secondary | ICD-10-CM | POA: Insufficient documentation

## 2012-03-15 DIAGNOSIS — I319 Disease of pericardium, unspecified: Secondary | ICD-10-CM | POA: Insufficient documentation

## 2012-03-15 DIAGNOSIS — R1012 Left upper quadrant pain: Secondary | ICD-10-CM | POA: Insufficient documentation

## 2012-03-15 DIAGNOSIS — D5700 Hb-SS disease with crisis, unspecified: Secondary | ICD-10-CM

## 2012-03-15 DIAGNOSIS — R509 Fever, unspecified: Secondary | ICD-10-CM | POA: Insufficient documentation

## 2012-03-15 LAB — CBC WITH DIFFERENTIAL (CANCER CENTER ONLY)
EOS%: 0.7 % (ref 0.0–7.0)
Eosinophils Absolute: 0.2 10*3/uL (ref 0.0–0.5)
LYMPH%: 13.5 % — ABNORMAL LOW (ref 14.0–48.0)
MCH: 33.2 pg (ref 28.0–33.4)
MCHC: 34.3 g/dL (ref 32.0–35.9)
MCV: 97 fL (ref 82–98)
MONO%: 10.8 % (ref 0.0–13.0)
Platelets: 313 10*3/uL (ref 145–400)
RBC: 2.2 10*6/uL — ABNORMAL LOW (ref 4.20–5.70)
RDW: 21.6 % — ABNORMAL HIGH (ref 11.1–15.7)

## 2012-03-15 LAB — HOLD TUBE, BLOOD BANK - CHCC SATELLITE

## 2012-03-15 LAB — PREPARE RBC (CROSSMATCH)

## 2012-03-15 MED ORDER — HYDROMORPHONE HCL ER 12 MG PO T24A: 12.0000 mg | EXTENDED_RELEASE_TABLET | ORAL | Status: DC

## 2012-03-15 MED ORDER — HYDROMORPHONE HCL PF 4 MG/ML IJ SOLN
4.0000 mg | Freq: Once | INTRAMUSCULAR | Status: AC
  Administered 2012-03-15: 4 mg via INTRAVENOUS

## 2012-03-15 MED ORDER — HYDROMORPHONE HCL 4 MG PO TABS: 4.0000 mg | ORAL_TABLET | Freq: Four times a day (QID) | ORAL | Status: DC | PRN

## 2012-03-15 MED ORDER — IOHEXOL 300 MG/ML  SOLN
100.0000 mL | Freq: Once | INTRAMUSCULAR | Status: AC | PRN
  Administered 2012-03-15: 100 mL via INTRAVENOUS

## 2012-03-15 MED ORDER — SODIUM CHLORIDE 0.9 % IV SOLN
INTRAVENOUS | Status: DC
  Administered 2012-03-15: 11:00:00 via INTRAVENOUS

## 2012-03-15 MED ORDER — HEPARIN SOD (PORK) LOCK FLUSH 100 UNIT/ML IV SOLN
500.0000 [IU] | Freq: Once | INTRAVENOUS | Status: AC
  Administered 2012-03-15: 500 [IU] via INTRAVENOUS
  Filled 2012-03-15: qty 5

## 2012-03-15 MED ORDER — PROMETHAZINE HCL 25 MG/ML IJ SOLN
25.0000 mg | Freq: Once | INTRAMUSCULAR | Status: AC
  Administered 2012-03-15: 25 mg via INTRAVENOUS

## 2012-03-15 MED ORDER — HYDROMORPHONE HCL PF 4 MG/ML IJ SOLN: 8.0000 mg | Freq: Once | INTRAMUSCULAR | Status: DC

## 2012-03-15 MED ORDER — SODIUM CHLORIDE 0.9 % IJ SOLN
10.0000 mL | INTRAMUSCULAR | Status: DC | PRN
  Administered 2012-03-15: 10 mL via INTRAVENOUS
  Filled 2012-03-15: qty 10

## 2012-03-16 ENCOUNTER — Other Ambulatory Visit: Payer: Self-pay | Admitting: *Deleted

## 2012-03-16 ENCOUNTER — Encounter: Payer: Self-pay | Admitting: *Deleted

## 2012-03-16 ENCOUNTER — Ambulatory Visit: Payer: Medicaid Other | Admitting: Hematology & Oncology

## 2012-03-16 ENCOUNTER — Ambulatory Visit (HOSPITAL_BASED_OUTPATIENT_CLINIC_OR_DEPARTMENT_OTHER): Payer: Medicaid Other

## 2012-03-16 VITALS — BP 112/66 | HR 77 | Temp 97.1°F | Resp 20 | Wt 126.0 lb

## 2012-03-16 DIAGNOSIS — R079 Chest pain, unspecified: Secondary | ICD-10-CM

## 2012-03-16 DIAGNOSIS — D571 Sickle-cell disease without crisis: Secondary | ICD-10-CM

## 2012-03-16 DIAGNOSIS — D5700 Hb-SS disease with crisis, unspecified: Secondary | ICD-10-CM

## 2012-03-16 LAB — IRON AND TIBC
Iron: 171 ug/dL — ABNORMAL HIGH (ref 42–165)
UIBC: <15 ug/dL — ABNORMAL LOW (ref 125–400)

## 2012-03-16 LAB — COMPREHENSIVE METABOLIC PANEL
ALT: 27 U/L (ref 0–53)
Alkaline Phosphatase: 90 U/L (ref 39–117)
CO2: 25 mEq/L (ref 19–32)
Creatinine, Ser: 0.69 mg/dL (ref 0.50–1.35)
Glucose, Bld: 93 mg/dL (ref 70–99)
Total Bilirubin: 5.4 mg/dL — ABNORMAL HIGH (ref 0.3–1.2)

## 2012-03-16 LAB — RETICULOCYTES (CHCC): Retic Ct Pct: 16.6 % — ABNORMAL HIGH (ref 0.4–2.3)

## 2012-03-16 MED ORDER — SODIUM CHLORIDE 0.9 % IJ SOLN
10.0000 mL | INTRAMUSCULAR | Status: AC | PRN
  Administered 2012-03-16: 10 mL
  Filled 2012-03-16: qty 10

## 2012-03-16 MED ORDER — FUROSEMIDE 10 MG/ML IJ SOLN
20.0000 mg | Freq: Once | INTRAMUSCULAR | Status: AC
  Administered 2012-03-16: 20 mg via INTRAMUSCULAR

## 2012-03-16 MED ORDER — PROMETHAZINE HCL 25 MG/ML IJ SOLN
25.0000 mg | Freq: Once | INTRAMUSCULAR | Status: AC
  Administered 2012-03-16: 25 mg via INTRAVENOUS

## 2012-03-16 MED ORDER — HYDROMORPHONE HCL PF 4 MG/ML IJ SOLN
2.0000 mg | Freq: Once | INTRAMUSCULAR | Status: AC
  Administered 2012-03-16: 2 mg via INTRAVENOUS

## 2012-03-16 MED ORDER — HYDROMORPHONE HCL PF 4 MG/ML IJ SOLN
6.0000 mg | Freq: Once | INTRAMUSCULAR | Status: AC
  Administered 2012-03-16: 6 mg via INTRAVENOUS

## 2012-03-16 MED ORDER — HYDROMORPHONE HCL PF 4 MG/ML IJ SOLN
4.0000 mg | INTRAMUSCULAR | Status: DC | PRN
  Administered 2012-03-16 (×2): 4 mg via INTRAVENOUS

## 2012-03-16 MED ORDER — SODIUM CHLORIDE 0.9 % IV SOLN
250.0000 mL | Freq: Once | INTRAVENOUS | Status: AC
  Administered 2012-03-16: 250 mL via INTRAVENOUS

## 2012-03-16 MED ORDER — LORAZEPAM 1 MG PO TABS
1.0000 mg | ORAL_TABLET | Freq: Once | ORAL | Status: AC
  Administered 2012-03-16: 1 mg via ORAL

## 2012-03-16 MED ORDER — MOXIFLOXACIN HCL IN NACL 400 MG/250ML IV SOLN
400.0000 mg | INTRAVENOUS | Status: DC
  Administered 2012-03-16: 400 mg via INTRAVENOUS
  Filled 2012-03-16: qty 250

## 2012-03-16 MED ORDER — SODIUM CHLORIDE 0.9 % IV SOLN
15.0000 mg/kg/h | Freq: Once | INTRAVENOUS | Status: AC
  Administered 2012-03-16: 15 mg/kg/h via INTRAVENOUS
  Filled 2012-03-16: qty 2

## 2012-03-16 MED ORDER — ACETAMINOPHEN 325 MG PO TABS
650.0000 mg | ORAL_TABLET | Freq: Once | ORAL | Status: AC
  Administered 2012-03-16: 650 mg via ORAL

## 2012-03-16 MED ORDER — HEPARIN SOD (PORK) LOCK FLUSH 100 UNIT/ML IV SOLN
500.0000 [IU] | Freq: Once | INTRAVENOUS | Status: AC
  Administered 2012-03-16: 500 [IU] via INTRAVENOUS
  Filled 2012-03-16: qty 5

## 2012-03-16 MED ORDER — HEPARIN SOD (PORK) LOCK FLUSH 100 UNIT/ML IV SOLN
250.0000 [IU] | INTRAVENOUS | Status: DC | PRN
  Filled 2012-03-16: qty 5

## 2012-03-16 MED ORDER — DIPHENHYDRAMINE HCL 25 MG PO CAPS
25.0000 mg | ORAL_CAPSULE | Freq: Once | ORAL | Status: AC
  Administered 2012-03-16: 25 mg via ORAL

## 2012-03-16 NOTE — Patient Instructions (Signed)
Moxifloxacin solution for infusion What is this medicine? MOXIFLOXACIN (mox i FLOX a sin) is a quinolone antibiotic. It is used to treat certain kinds of bacterial infections. It will not work for colds, flu, or other viral infections. This medicine may be used for other purposes; ask your health care provider or pharmacist if you have questions. What should I tell my health care provider before I take this medicine? They need to know if you have any of these conditions: -cerebral diseases -heart disease -joint problems -liver disease -myasthenia gravis -seizure disorder -an unusual or allergic reaction to moxifloxacin, other quinolone antibiotics, other medicines, foods, dyes, or preservatives -pregnant or try to get pregnant -breast-feeding How should I use this medicine? This medicine is for infusion into a vein. It is usually given by a health care provider in a hospital or clinic. If you get this medicine at home, you will be taught how to prepare and give this medicine. Use exactly as directed. Take your medicine at regular intervals. Do not take your medicine more often than directed. It is important that you put your used needles and syringes in a special sharps container. Do not put them in a trash can. If you do not have a sharps container, call your pharmacist or healthcare provider to get one. A special MedGuide will be given to you by the pharmacist with each prescription and refill. Be sure to read this information carefully each time. Talk to your pediatrician regarding the use of this medicine in children. Special care may be needed. Overdosage: If you think you have taken too much of this medicine contact a poison control center or emergency room at once. NOTE: This medicine is only for you. Do not share this medicine with others. What if I miss a dose? If you miss a dose, take it as soon as you can. If it is almost time for your next dose, take only that dose. Do not take  double or extra doses. What may interact with this medicine? Do not take this medicine with any of the following medications: -arsenic trioxide -chloroquine -cisapride -droperidol -halofantrine -pentamidine -phenothiazines like chlorpromazine, mesoridazine, prochlorperazine, thioridazine -pimozide -some medications for irregular heart beat like amiodarone, disopyramide, dofetilide, flecainide, ibutilide, quinidine, procainamide, sotalol -ziprasidone This medicine may also interact with the following medications: -erythromycin -medicines for depression, anxiety, or psychotic disturbances -NSAIDS, medicines for pain and inflammation, like ibuprofen or naproxen -warfarin This list may not describe all possible interactions. Give your health care provider a list of all the medicines, herbs, non-prescription drugs, or dietary supplements you use. Also tell them if you smoke, drink alcohol, or use illegal drugs. Some items may interact with your medicine. What should I watch for while using this medicine? Tell your doctor or health care professional if your symptoms do not start to get better or if they get worse. Do not treat diarrhea with over-the-counter products. Contact your doctor if you have diarrhea that lasts more than 2 days or if the diarrhea is severe and watery. If you have diabetes, monitor your blood glucose carefully while on this medicine. You may get drowsy or dizzy. Do not drive, use machinery, or do anything that needs mental alertness until you know how this medicine affects you. Do not sit or stand up quickly, especially if you are an older patient. This reduces the risk of dizzy or fainting spells. This medicine can make you more sensitive to the sun. Keep out of the sun. If you cannot avoid  being in the sun, wear protective clothing and use sunscreen. Do not use sun lamps or tanning beds/booths. What side effects may I notice from receiving this medicine? Side effects that  you should report to your doctor or health care professional as soon as possible: -allergic reactions like skin rash or hives, swelling of the face, lips, or tongue -breathing problems -confusion, nightmares, or hallucinations -fast, irregular heartbeat -feeling faint or lightheaded, falls -joint, muscle, or tendon pain or swelling -pain, tingling, numbness in the hands or feet -redness, blistering, peeling or loosening of the skin, including inside the mouth -seizures -trouble passing urine or change in the amount of urine -unusual weakness  Side effects that usually do not require medical attention (report to your doctor or health care professional if they continue or are bothersome): -diarrhea -dry mouth -headache -pain or irritation at site where injected -stomach upset, nausea -trouble sleeping This list may not describe all possible side effects. Call your doctor for medical advice about side effects. You may report side effects to FDA at 1-800-FDA-1088. Where should I keep my medicine? Keep out of the reach of children. If you are using this medicine at home, you will be instructed on how to store this medicine. Throw away any unused medicine after the expiration date on the label. NOTE: This sheet is a summary. It may not cover all possible information. If you have questions about this medicine, talk to your doctor, pharmacist, or health care provider.  2012, Elsevier/Gold Standard. (08/17/2009 2:02:45 PM) DEFEROXAMINE (dee fer OX a meen) helps to remove excess iron from the body. This may be necessary in patients who have received multiple blood transfusions and people who have ingested too much iron. This medicine may be used for other purposes; ask your health care provider or pharmacist if you have questions. What should I tell my health care provider before I take this medicine? They need to know if you have any of these conditions: -difficulty passing urine or very little  urine -kidney disease -an unusual or allergic reaction to deferoxamine, other medicines, foods, dyes, or preservatives -pregnant or trying to get pregnant -breast-feeding How should I use this medicine? This medicine is for injection into a muscle, slow infusion into a vein, or infusion under the skin. It is usually given by a health care professional in a hospital or clinic setting. Talk to your pediatrician regarding the use of this medicine in children. While this medicine may be prescribed for children as young as 3 years for selected conditions, precautions do apply. Patients over 23 years old may have a stronger reaction and need a smaller dose. Overdosage: If you think you have taken too much of this medicine contact a poison control center or emergency room at once. NOTE: This medicine is only for you. Do not share this medicine with others. What if I miss a dose? This does not apply. What may interact with this medicine? Do not take this medicine with any of the following medications: -preparations containing iron This medicine may also interact with the following medications: -ascorbic acid (vitamin C) -gallium-67 - used in certain diagnostic tests -prochlorperazine This list may not describe all possible interactions. Give your health care provider a list of all the medicines, herbs, non-prescription drugs, or dietary supplements you use. Also tell them if you smoke, drink alcohol, or use illegal drugs. Some items may interact with your medicine. What should I watch for while using this medicine? Your condition will be monitored carefully  while you are receiving this medicine. Visit your doctor or health care professional for regular checks on your progress. Tell your doctor or health care professional as soon as you can if you notice any change in your sight or hearing. You may get drowsy or dizzy or have problems with your vision. Do not drive, use machinery, or do anything that  needs mental alertness until you know how this medicine affects you. Do not stand or sit up quickly, especially if you are an older patient. This reduces the risk of dizzy or fainting spells. While you are receiving this medicine, do not take any vitamin C products unless your doctor or health care professional tells you to. What side effects may I notice from receiving this medicine? Side effects that you should report to your doctor or health care professional as soon as possible: -allergic reactions like skin rash, itching or hives, swelling of the face, lips, or tongue -breathing problems -change in vision -diarrhea -fast heartbeat -feel dizzy, faint -fever -loss of hearing -muscle cramps -pain, swelling where injected -skin flushing, redness -stomach pain Side effects that usually do not require medical attention (report to your doctor or health care professional if they continue or are bothersome): -red coloration of urine This list may not describe all possible side effects. Call your doctor for medical advice about side effects. You may report side effects to FDA at 1-800-FDA-1088. Where should I keep my medicine? This drug is given in a hospital or clinic and will not be stored at home. NOTE: This sheet is a summary. It may not cover all possible information. If you have questions about this medicine, talk to your doctor, pharmacist, or health care provider.  2012, Elsevier/Gold Standard. (09/20/2007 5:37:06 PM)

## 2012-03-16 NOTE — Progress Notes (Signed)
This office note has been dictated.

## 2012-03-16 NOTE — Progress Notes (Signed)
1400 Continue to c/o ribcage pain, Dr. Myna Hidalgo notified. Orders received for Dilaudid 6 mg.  Patient ambulating in infusion area, patient and girlfriend leave department after being instructed not to leave.  Dr. Johnston Ebbs with patient outside.  Christopher Warren [phone] Patient again c/o pain, Dr. Myna Hidalgo notified. Orders received for Dilaudid 2 mg.  Pt became agitated, wanting to call Dr. Myna Hidalgo, explained he had gone to hospital.  Dr. Myna Hidalgo again notified, orders received for additional Dilaudid and sleep medication phoned in. [name], Haely Leyland Laural Benes

## 2012-03-16 NOTE — Progress Notes (Signed)
Christopher Warren presents today for phlebotomy per MD orders. Phlebotomy procedure started at 0815and ended at 0835. 480 mls removed. Patient observed for 30 minutes after procedure without any incident. Patient tolerated procedure well. To receive 2 units PRBCs.

## 2012-03-16 NOTE — Progress Notes (Signed)
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  CURRENT THERAPY: 1. Hydrea 500 mg p.o. t.i.d. 2. Exjade 1000 mg p.o. daily. 3. Folic acid 1 mg p.o. daily.  INTERIM HISTORY:  [name] comes in for followup.  He usually shows up when he has problems.  He says that he is hurting in his right and left upper quadrant.  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  We did do a CT scan on him today.  The CT scan did show areas of infarction in his spleen.  Again, I think this is all related to his sickle cell.  There is some slight splenomegaly.  He had a stable pericardial effusion.  I am not sure at all as to whether not he is taking the Exjade.  His ferritin has always been elevated.  Today, his ferritin is 5000.  Again, whether or not he takes the Exjade is hard to say.  He has incredible hemolysis.  He does have a Port-A-Cath in.  He will not go for a complete exchange transfusion.  The best we can do is do "mini" exchanges.  He is having his typical joint issues.  He is having no cough.  He is having some slight temperatures.  PHYSICAL EXAMINATION:  This is a relatively small-appearing black gentleman in no obvious distress.  Vital Signs:  99.1, pulse 100, respiratory rate 22, blood pressure 113/62.  Weight is 126.  Head and neck:  Normocephalic, atraumatic skull.  There are no ocular or oral lesions.  There are no palpable cervical or supraclavicular lymph nodes. Lungs:  Clear bilaterally.  He has no rales, wheezes or rhonchi.  No friction rubs are noted.  Cardiac:  Regular rate and rhythm with a normal S1 and S2.  There are no murmurs, rubs or bruits.  Abdomen:  Soft with good bowel sounds.  There is some tenderness in the left upper quadrant.  His spleen tip may be at the left costal margin.  There is no hepatomegaly.  There is no ascites.  Extremities:  Some tenderness in the long bones.  No swelling is noted in his legs.   Neurologic:  No focal neurological deficits.  LABORATORY STUDIES:  White cell count is 28, hemoglobin 7.3, hematocrit 21.3, platelet count 313.  IMPRESSION:  Christopher Warren is a 36 year old African American gentleman with hemoglobin SS disease.  Again, iron overload will eventually get him.  Personally, I do not think he is taking the Exjade.  I think it is hard to prove this, however.  I tried to impress upon him the importance of taking the Exjade.  Again, he does do what he feels is best for himself.  I think we will go ahead and maybe do an exchange on him tomorrow.  This will be a "mini" exchange.  Maybe this will help with his pain and try to get his blood thinned out a little bit.  Christopher Warren is always a challenge.  He is doing about as well as one could expect.  Again, it is hard to say how compliant he is with his medications.  We will have to figure out when to get him back again.  Again, he tends to come when he has issues.    ______________________________ [name], M.D. PRE/MEDQ  D:  03/15/2012  T:  03/16/2012  Job:  [id number]

## 2012-03-17 NOTE — Progress Notes (Signed)
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  CURRENT THERAPY: 1. Hydrea 500 mg p.o. t.i.d. 2. Folic acid 1 mg p.o. daily.  INTERIM HISTORY:  I had a very long talk with Christopher Warren today.  His ferritin has been going up.  When we checked it yesterday, it was 5044.  He says that he has not received the latest delivery for his Exjade.  We called the company.  Apparently he has not had it for a year.  The common said that they have been trying to get a hold him but can never get hold of him.  This irritated incredibly.  Christopher Warren never told me that he had not been getting the Exjade.  He would always tell me he had been taking it but that delivery would occasionally be late.  I was very honest with him and was very blunt and told him that if his ferritin and his iron did not get better, he would not be alive in 2 years.  His heart would become stiff and his liver likely would develop cirrhosis.  He did not like hearing this.  Unfortunately, I had no choice but to tell him the truth in no uncertain terms.  He may have had a hard time hearing this but again I think as a responsible physician, I had to let him know the seriousness of what he was doing himself.  It is just very frustrating that he has not been honest about taking the Exjade.  I just needed to let him know the consequences of his actions.  Since we talked, he said that he will start taking it.  I hope that he will be true to his word.  We have him in today getting a "mini" exchange transfusion.  He got 1 unit removed and 2 units transfused.  He will also get some Desferal today.  I want to try to do a Desferal pump on him until he gets the Exjade.  We will do 2 g a day subcutaneous for 4 days before he gets his Exjade.  He is supposed to get it next Friday.  Again, I was brutally honest with Christopher Warren about the consequences of his decisions and not being truthful with me.  I told that we do  not want him to die.  However, our hands are "tied" if he is not going to do what we want him to do.  I talked to him for about 40 minutes.  I did not examine him as I really examined him yesterday.  Today, was vital signs were pretty stable. Blood pressure was 112/66. He was afebrile.  Yesterday, his white count was 27.9, hemoglobin was 7.3, hematocrit was 21.  His retic count was 16%.  Ultimately, he is going to need to have a complete exchange transfusion. Unfortunately, this might be impossible as he has not allowed [place] to do this in the past.  The best we have done are "mini" exchanges.  These have also contributed to his iron overload status.  Once he starts his Exjade, we will have to watch his renal function.  I am pretty sure that he is set up to see [place] in another couple weeks or so.  Hopefully, he will make his appointments.    ______________________________ Christopher Warren, M.D. PRE/MEDQ  D:  03/16/2012  T:  03/17/2012  Job:  [id number]

## 2012-03-19 ENCOUNTER — Telehealth: Payer: Self-pay | Admitting: Hematology & Oncology

## 2012-03-19 LAB — TYPE AND SCREEN
ABO/RH(D): O POS
Unit division: 0
Unit division: 0

## 2012-03-19 NOTE — Telephone Encounter (Signed)
Pt aware of 10-17 appointment

## 2012-03-22 ENCOUNTER — Other Ambulatory Visit: Payer: Self-pay | Admitting: Hematology & Oncology

## 2012-03-22 ENCOUNTER — Emergency Department (HOSPITAL_BASED_OUTPATIENT_CLINIC_OR_DEPARTMENT_OTHER)
Admission: EM | Admit: 2012-03-22 | Discharge: 2012-03-22 | Disposition: A | Discharge status: Home / Self Care | Payer: Medicaid Other | Attending: Emergency Medicine | Admitting: Emergency Medicine

## 2012-03-22 ENCOUNTER — Telehealth: Payer: Self-pay | Admitting: Oncology

## 2012-03-22 ENCOUNTER — Encounter (HOSPITAL_BASED_OUTPATIENT_CLINIC_OR_DEPARTMENT_OTHER): Payer: Self-pay | Admitting: *Deleted

## 2012-03-22 ENCOUNTER — Telehealth: Payer: Self-pay | Admitting: *Deleted

## 2012-03-22 DIAGNOSIS — F17200 Nicotine dependence, unspecified, uncomplicated: Secondary | ICD-10-CM | POA: Insufficient documentation

## 2012-03-22 DIAGNOSIS — D571 Sickle-cell disease without crisis: Secondary | ICD-10-CM

## 2012-03-22 DIAGNOSIS — R011 Cardiac murmur, unspecified: Secondary | ICD-10-CM | POA: Insufficient documentation

## 2012-03-22 DIAGNOSIS — J45909 Unspecified asthma, uncomplicated: Secondary | ICD-10-CM | POA: Insufficient documentation

## 2012-03-22 DIAGNOSIS — Z91040 Latex allergy status: Secondary | ICD-10-CM | POA: Insufficient documentation

## 2012-03-22 DIAGNOSIS — D5700 Hb-SS disease with crisis, unspecified: Secondary | ICD-10-CM | POA: Insufficient documentation

## 2012-03-22 DIAGNOSIS — Z885 Allergy status to narcotic agent status: Secondary | ICD-10-CM | POA: Insufficient documentation

## 2012-03-22 LAB — COMPREHENSIVE METABOLIC PANEL
ALT: 28 U/L (ref 0–53)
AST: 48 U/L — ABNORMAL HIGH (ref 0–37)
Alkaline Phosphatase: 88 U/L (ref 39–117)
CO2: 25 mEq/L (ref 19–32)
Calcium: 8.8 mg/dL (ref 8.4–10.5)
GFR calc Af Amer: >90 mL/min (ref >90)
GFR calc non Af Amer: >90 mL/min (ref >90)
Glucose, Bld: 108 mg/dL — ABNORMAL HIGH (ref 70–99)
Potassium: 3 mEq/L — ABNORMAL LOW (ref 3.5–5.1)
Sodium: 138 mEq/L (ref 135–145)
Total Protein: 7.4 g/dL (ref 6.0–8.3)

## 2012-03-22 LAB — CBC WITH DIFFERENTIAL/PLATELET
Basophils Relative: 0 % (ref 0–1)
Eosinophils Relative: 1 % (ref 0–5)
HCT: 20.4 % — ABNORMAL LOW (ref 39.0–52.0)
Hemoglobin: 7.1 g/dL — ABNORMAL LOW (ref 13.0–17.0)
Lymphocytes Relative: 19 % (ref 12–46)
MCV: 91.1 fL (ref 78.0–100.0)
Monocytes Relative: 4 % (ref 3–12)
Myelocytes: 1 %
Neutro Abs: 16.2 10*3/uL — ABNORMAL HIGH (ref 1.7–7.7)
RBC: 2.24 MIL/uL — ABNORMAL LOW (ref 4.22–5.81)
WBC: 21.4 10*3/uL — ABNORMAL HIGH (ref 4.0–10.5)

## 2012-03-22 LAB — RETICULOCYTES: Retic Count, Absolute: 257.5 10*3/uL — ABNORMAL HIGH (ref 19.0–186.0)

## 2012-03-22 MED ORDER — HEPARIN SOD (PORK) LOCK FLUSH 100 UNIT/ML IV SOLN: 500.0000 [IU] | Freq: Once | INTRAVENOUS | Status: DC

## 2012-03-22 MED ORDER — DIPHENHYDRAMINE HCL 50 MG/ML IJ SOLN
25.0000 mg | Freq: Once | INTRAMUSCULAR | Status: DC
  Filled 2012-03-22: qty 1

## 2012-03-22 MED ORDER — HEPARIN SOD (PORK) LOCK FLUSH 100 UNIT/ML IV SOLN
INTRAVENOUS | Status: AC
  Administered 2012-03-22: 500 [IU] via INTRAVENOUS
  Filled 2012-03-22: qty 5

## 2012-03-22 MED ORDER — ONDANSETRON HCL 4 MG/2ML IJ SOLN
4.0000 mg | Freq: Once | INTRAMUSCULAR | Status: AC
  Administered 2012-03-22: 4 mg via INTRAVASCULAR
  Administered 2012-03-22: 4 mg via INTRAVENOUS

## 2012-03-22 MED ORDER — HYDROMORPHONE HCL PF 2 MG/ML IJ SOLN
2.0000 mg | Freq: Once | INTRAMUSCULAR | Status: AC
  Administered 2012-03-22: 2 mg via INTRAVENOUS
  Filled 2012-03-22: qty 1

## 2012-03-22 MED ORDER — SODIUM CHLORIDE 0.9 % IV SOLN
Freq: Once | INTRAVENOUS | Status: AC
  Administered 2012-03-22: 13:00:00 via INTRAVENOUS

## 2012-03-22 MED ORDER — HYDROMORPHONE HCL PF 2 MG/ML IJ SOLN
2.0000 mg | Freq: Once | INTRAMUSCULAR | Status: AC
  Administered 2012-03-22: 2 mg via INTRAVENOUS

## 2012-03-22 MED ORDER — ONDANSETRON HCL 4 MG/2ML IJ SOLN
INTRAMUSCULAR | Status: AC
  Administered 2012-03-22: 4 mg via INTRAVASCULAR
  Filled 2012-03-22: qty 2

## 2012-03-22 MED ORDER — HYDROMORPHONE HCL PF 2 MG/ML IJ SOLN
INTRAMUSCULAR | Status: AC
  Administered 2012-03-22: 2 mg via INTRAVENOUS
  Filled 2012-03-22: qty 1

## 2012-03-22 NOTE — ED Notes (Signed)
ED to ED transfer to [facility] --sent by Langston Masker and being received by Dr. August Saucer

## 2012-03-22 NOTE — ED Notes (Signed)
Patient states he has had pain in his lower back and legs since yesterday from his sickle cell disease.

## 2012-03-22 NOTE — ED Notes (Signed)
[facility] has been notified of transfer to [facility] ED--spoke with [employer]

## 2012-03-22 NOTE — ED Notes (Signed)
Patient is going to ED by private vehicle--[facility] has been cancelled.

## 2012-03-22 NOTE — ED Provider Notes (Signed)
History/physical exam/procedure(s) were performed by non-physician practitioner and as supervising physician I was immediately available for consultation/collaboration. I have reviewed all notes and am in agreement with care and plan.   Hilario Quarry, MD 03/22/12 [phone]

## 2012-03-22 NOTE — ED Provider Notes (Signed)
History     CSN: [id number]  Arrival date & time 03/22/12  1145   First MD Initiated Contact with Patient 03/22/12 1210      Chief Complaint  Patient presents with  . Sickle Cell Pain Crisis    (Consider location/radiation/quality/duration/timing/severity/associated sxs/prior treatment) Patient is a 36 y.o. male presenting with sickle cell pain. The history is provided by the patient.  Sickle Cell Pain Crisis  This is a recurrent problem. The problem occurs continuously. The pain is severe. Associated symptoms include back pain and joint pain. Pertinent negatives include no abdominal pain.   Pt has a history of sickle cell.   Pt reports he called his doctor and was told to come here.  (Dr. Myna Hidalgo)  Pt reports pain is not relieved by home medications.   Past Medical History  Diagnosis Date  . Sickle cell anemia   . Asthma   . 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 Heart murmur     Past Surgical History  Procedure Date  . Cholecystectomy     Family History  Problem Relation Age of Onset  . Sickle cell anemia Mother   . Sickle cell anemia Son     History  Substance Use Topics  . Smoking status: Current Every Day Smoker    Types: Cigarettes  . Smokeless tobacco: Not on file  . Alcohol Use: No      Review of Systems  Gastrointestinal: Negative for abdominal pain.  Musculoskeletal: Positive for back pain and joint pain.  All other systems reviewed and are negative.    Allergies  Morphine and related; Adhesive; and Latex  Home Medications   Current Outpatient Rx  Name Route Sig Dispense Refill  . ALBUTEROL SULFATE HFA 108 (90 BASE) MCG/ACT IN AERS Inhalation Inhale 2 puffs into the lungs every 6 (six) hours as needed for wheezing. 2 Inhaler 4  . DEFERASIROX 500 MG PO TBSO Oral Take 1,000 mg by mouth daily.    Marland Kitchen DIPHENHYDRAMINE HCL 25 MG PO CAPS Oral Take 25 mg by mouth every 6 (six) hours as needed.    Marland Kitchen FOLIC ACID 1 MG PO TABS Oral Take 1 mg by mouth daily.       Marland Kitchen HYDROMORPHONE HCL 4 MG PO TABS Oral Take 1 tablet (4 mg total) by mouth every 6 (six) hours as needed. For pain 120 tablet 0  . HYDROMORPHONE HCL ER 12 MG PO TB24 Oral Take 1 tablet (12 mg total) by mouth daily. 30 tablet 0  . HYDROXYUREA 500 MG PO CAPS Oral Take 1 capsule (500 mg total) by mouth 3 (three) times daily. 90 capsule 1  . PROMETHAZINE HCL 25 MG PO TABS Oral Take 1 tablet (25 mg total) by mouth every 8 (eight) hours as needed. 90 tablet 4  . WARFARIN SODIUM 2 MG PO TABS Oral Take 1 tablet (2 mg total) by mouth daily. 30 tablet 6    BP 117/48  Pulse 101  Temp 99.6 F (37.6 C) (Oral)  SpO2 100%  Physical Exam  Nursing note and vitals reviewed. Constitutional: He appears well-developed.  HENT:  Head: Normocephalic.  Eyes: Pupils are equal, round, and reactive to light.  Neck: Normal range of motion.  Cardiovascular: Normal rate.   Pulmonary/Chest: Effort normal.  Abdominal: Soft.  Musculoskeletal: Normal range of motion.  Neurological: He is alert.  Skin: Skin is warm.    ED Course  Procedures (including critical care time)   Labs Reviewed  CBC WITH DIFFERENTIAL  COMPREHENSIVE  METABOLIC PANEL  RETICULOCYTES   No results found. Results for orders placed during the hospital encounter of 03/22/12  CBC WITH DIFFERENTIAL      Component Value Range   WBC 21.4 (*) 4.0 - 10.5 K/uL   RBC 2.24 (*) 4.22 - 5.81 MIL/uL   Hemoglobin 7.1 (*) 13.0 - 17.0 g/dL   HCT 19.1 (*) 47.8 - 29.5 %   MCV 91.1  78.0 - 100.0 fL   MCH 31.7  26.0 - 34.0 pg   MCHC 34.8  30.0 - 36.0 g/dL   RDW 62.1 (*) 30.8 - 65.7 %   Platelets 254  150 - 400 K/uL   Neutrophils Relative 75  43 - 77 %   Lymphocytes Relative 19  12 - 46 %   Monocytes Relative 4  3 - 12 %   Eosinophils Relative 1  0 - 5 %   Basophils Relative 0  0 - 1 %   Myelocytes 1     Neutro Abs 16.2 (*) 1.7 - 7.7 K/uL   Lymphs Abs 4.1 (*) 0.7 - 4.0 K/uL   Monocytes Absolute 0.9  0.1 - 1.0 K/uL   Eosinophils Absolute 0.2   0.0 - 0.7 K/uL   Basophils Absolute 0.0  0.0 - 0.1 K/uL   RBC Morphology SICKLE CELLS     Smear Review LARGE PLATELETS PRESENT    COMPREHENSIVE METABOLIC PANEL      Component Value Range   Sodium 138  135 - 145 mEq/L   Potassium 3.0 (*) 3.5 - 5.1 mEq/L   Chloride 104  96 - 112 mEq/L   CO2 25  19 - 32 mEq/L   Glucose, Bld 108 (*) 70 - 99 mg/dL   BUN 7  6 - 23 mg/dL   Creatinine, Ser 8.46  0.50 - 1.35 mg/dL   Calcium 8.8  8.4 - 96.2 mg/dL   Total Protein 7.4  6.0 - 8.3 g/dL   Albumin 3.4 (*) 3.5 - 5.2 g/dL   AST 48 (*) 0 - 37 U/L   ALT 28  0 - 53 U/L   Alkaline Phosphatase 88  39 - 117 U/L   Total Bilirubin 4.3 (*) 0.3 - 1.2 mg/dL   GFR calc non Af Amer >90  >90 mL/min   GFR calc Af Amer >90  >90 mL/min  RETICULOCYTES      Component Value Range   Retic Ct Pct 11.1 (*) 0.4 - 3.1 %   RBC. 2.32 (*) 4.22 - 5.81 MIL/uL   Retic Count, Manual 257.5 (*) 19.0 - 186.0 K/uL   Ct Abdomen W Contrast  03/15/2012  *RADIOLOGY REPORT*  Clinical Data: Left upper quadrant pain with fever and cough. Sickle cell disease.  CT ABDOMEN WITH CONTRAST  Technique:  Multidetector CT imaging of the abdomen was performed following the standard protocol during bolus administration of intravenous contrast.  Contrast: OMNIPAQUE IOHEXOL 300 MG/ML  SOLN  Comparison: CT abdomen pelvis 03/15/2011.  Findings: There is mild cardiomegaly and a moderate pericardial effusion.  Pericardial effusion does not appear markedly changed. Lung bases are clear.  The spleen is heterogeneous and enlarged measuring 18 cm craniocaudal.  There is a lesion in the anterior margin of the spleen which appears larger than on the prior study measuring 4.1 x 3.8 cm compared to 3.1 x 2.5 cm.  A second low attenuation lesion in the posterior aspect of the spleen is also increased in size slightly measuring 4.3 x 3.2 cm compared to 3.8 x  2.9 cm.  Multiple foci of increased attenuation are seen scattered through the spleen consistent with  calcifications. No hemorrhage is identified.  The patient is status post cholecystectomy.  The liver, adrenal glands, pancreas and kidneys are unremarkable.  No fluid collection or lymphadenopathy is identified.  There is no focal bony abnormality.  IMPRESSION:  1.  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 Splenomegaly may be due to splenic sequestration of red blood cells.  Negative for hemorrhage. 2.  Moderate pericardial effusion is unchanged.   Original Report Authenticated By: Bernadene Bell. Maricela Curet, M.D.      No diagnosis found.    MDM  Pt given dilaudid 2 mg IV,  Labs obtained.   Pt reports no relief from pain.   Pt given 2 additional mg of dilaudid.  (Pt given a total of 8 mg in 2.5 hours.)      I spoke to the sickle cell clinic nurse who spoke with Dr. August Saucer.  He requested that pt go to ED and that he will see Pt there.   He feels pt will need to be an admission. I spoke to ED physician,     Pt refuses [facility] transfer.   Pt reports he will go by private vehicle.  (Pt understnds he is to go to [facility] Emergency department) Pt has decided he does not want admission.   I will discharge.   Pt is to go to [place] if symptoms worsen or change.  Lonia Skinner [place], [place] 03/22/12 [address] [place], [place] 03/22/12 1439  Lonia Skinner [place], [place] 03/22/12 1507  Lonia Skinner [place], [place] 03/22/12 1535

## 2012-03-22 NOTE — Telephone Encounter (Signed)
Pt called stating that he is still in pain despite taking 2 Dilaudid tabs as directed earlier by Dr Myna Hidalgo. Requested to speak with Dr Myna Hidalgo. Reviewed with Dr Myna Hidalgo. He was advised to go to the ER at [facility] in case of admission. Pt stated he wanted to come to the [facility] ER instead as he doesn't want to be admitted. Continued to encourage him to go the hospital that has the Sickle Cell Unit as he will likely be admitted if he is having a lot of pain. He again stated that he wanted to come to the [facility] ER. Explained that he may go to any ER he wishes but he may have to be transferred. He verbalized understanding.

## 2012-03-23 ENCOUNTER — Ambulatory Visit (HOSPITAL_BASED_OUTPATIENT_CLINIC_OR_DEPARTMENT_OTHER): Payer: Medicaid Other

## 2012-03-23 DIAGNOSIS — D571 Sickle-cell disease without crisis: Secondary | ICD-10-CM

## 2012-03-23 MED ORDER — HYDROMORPHONE HCL PF 4 MG/ML IJ SOLN
4.0000 mg | Freq: Once | INTRAMUSCULAR | Status: AC
  Administered 2012-03-23: 4 mg via INTRAVENOUS

## 2012-03-23 MED ORDER — HEPARIN SOD (PORK) LOCK FLUSH 100 UNIT/ML IV SOLN
500.0000 [IU] | Freq: Once | INTRAVENOUS | Status: AC
  Administered 2012-03-23: 500 [IU] via INTRAVENOUS
  Filled 2012-03-23: qty 5

## 2012-03-23 MED ORDER — SODIUM CHLORIDE 0.9 % IV SOLN
15.0000 mg/kg/h | Freq: Once | INTRAVENOUS | Status: AC
  Administered 2012-03-23: 15 mg/kg/h via INTRAVENOUS
  Filled 2012-03-23: qty 2

## 2012-03-23 MED ORDER — HYDROMORPHONE HCL PF 4 MG/ML IJ SOLN
4.0000 mg | INTRAMUSCULAR | Status: DC | PRN
  Administered 2012-03-23: 4 mg via INTRAVENOUS

## 2012-03-23 MED ORDER — SODIUM CHLORIDE 0.9 % IJ SOLN
10.0000 mL | INTRAMUSCULAR | Status: DC | PRN
  Administered 2012-03-23: 10 mL via INTRAVENOUS
  Filled 2012-03-23: qty 10

## 2012-03-23 MED ORDER — PROMETHAZINE HCL 25 MG/ML IJ SOLN
25.0000 mg | Freq: Four times a day (QID) | INTRAMUSCULAR | Status: DC | PRN
  Administered 2012-03-23: 25 mg via INTRAVENOUS

## 2012-03-23 MED ORDER — SODIUM CHLORIDE 0.9 % IV SOLN
INTRAVENOUS | Status: AC
  Administered 2012-03-23: 11:00:00 via INTRAVENOUS

## 2012-03-23 NOTE — Telephone Encounter (Signed)
See note from Dr. Myna Hidalgo.

## 2012-03-23 NOTE — Patient Instructions (Signed)
Hydromorphone injection What is this medicine? HYDROMORPHONE (hye droe MOR fone) is a pain reliever. It is used to treat moderate to severe pain. This medicine may be used for other purposes; ask your health care provider or pharmacist if you have questions. What should I tell my health care provider before I take this medicine? They need to know if you have any of these conditions: -brain tumor -drug abuse or addiction -head injury -heart disease -frequently drink alcohol containing drinks -kidney disease -liver disease -lung disease, asthma, or breathing problems -an allergic or unusual reaction to hydromorphone, other opioid analgesics, latex, other medicines, foods, dyes, or preservatives -pregnant or trying to get pregnant -breast-feeding How should I use this medicine? This medicine is for injection into a vein, into a muscle, or under the skin. It is usually given by a health care professional in a hospital or clinic setting. If you get this medicine at home, you will be taught how to prepare and give this medicine. Use exactly as directed. Take your medicine at regular intervals. Do not take your medicine more often than directed. It is important that you put your used needles and syringes in a special sharps container. Do not put them in a trash can. If you do not have a sharps container, call your pharmacist or healthcare provider to get one. Talk to your pediatrician regarding the use of this medicine in children. This medicine is not approved for use in children. Overdosage: If you think you have taken too much of this medicine contact a poison control center or emergency room at once. NOTE: This medicine is only for you. Do not share this medicine with others. What if I miss a dose? If you miss a dose, use it as soon as you can. If it is almost time for your next dose, use only that dose. Do not use double or extra doses. What may interact with this  medicine? -alcohol -antihistamines for allergy, cough and cold -medicines for anesthesia -medicines for depression, anxiety, or psychotic disturbances -medicines for sleep -muscle relaxants -naltrexone -narcotic medicines for pain -phenothiazines like chlorpromazine, mesoridazine, prochlorperazine, thioridazine This list may not describe all possible interactions. Give your health care provider a list of all the medicines, herbs, non-prescription drugs, or dietary supplements you use. Also tell them if you smoke, drink alcohol, or use illegal drugs. Some items may interact with your medicine. What should I watch for while using this medicine? Tell your doctor or health care professional if your pain does not go away, if it gets worse, or if you have new or a different type of pain. You may develop tolerance to the medicine. Tolerance means that you will need a higher dose of the medicine for pain relief. Tolerance is normal and is expected if you take this medicine for a long time. Do not suddenly stop taking your medicine because you may develop a severe reaction. Your body becomes used to the medicine. This does NOT mean you are addicted. Addiction is a behavior related to getting and using a drug for a non-medical reason. If you have pain, you have a medical reason to take pain medicine. Your doctor will tell you how much medicine to take. If your doctor wants you to stop the medicine, the dose will be slowly lowered over time to avoid any side effects. You may get drowsy or dizzy. Do not drive, use machinery, or do anything that needs mental alertness until you know how this medicine affects you.  Do not stand or sit up quickly, especially if you are an older patient. This reduces the risk of dizzy or fainting spells. Alcohol may interfere with the effect of this medicine. Avoid alcoholic drinks. This medicine will cause constipation. Try to have a bowel movement at least every 2 to 3 days. If you  do not have a bowel movement for 3 days, call your doctor or health care professional. Your mouth may get dry. Chewing sugarless gum or sucking hard candy, and drinking plenty of water may help. Contact your doctor if the problem does not go away or is severe. What side effects may I notice from receiving this medicine? Side effects that you should report to your doctor or health care professional as soon as possible: -allergic reactions like skin rash, itching or hives, swelling of the face, lips, or tongue -breathing problems -changes in vision -confusion -feeling faint or lightheaded, falls -seizures -slow or fast heartbeat -trouble passing urine or change in the amount of urine -trouble with balance, talking, walking Side effects that usually do not require medical attention (report to your doctor or health care professional if they continue or are bothersome): -difficulty sleeping -drowsiness -dry mouth -flushing -headache -itching -loss of appetite -nausea, vomiting This list may not describe all possible side effects. Call your doctor for medical advice about side effects. You may report side effects to FDA at 1-800-FDA-1088. Where should I keep my medicine? Keep out of the reach of children. This medicine can be abused. Keep your medicine in a safe place to protect it from theft. Do not share this medicine with anyone. Selling or giving away this medicine is dangerous and against the law. If you are using this medicine at home, you will be instructed on how to store this medicine. Throw away any unused medicine after the expiration date on the label. NOTE: This sheet is a summary. It may not cover all possible information. If you have questions about this medicine, talk to your doctor, pharmacist, or health care provider.  2012, Elsevier/Gold Standard. (10/29/2007 8:41:14 PM)Deferoxamine injection What is this medicine? DEFEROXAMINE (dee fer OX a meen) helps to remove excess iron  from the body. This may be necessary in patients who have received multiple blood transfusions and people who have ingested too much iron. This medicine may be used for other purposes; ask your health care provider or pharmacist if you have questions. What should I tell my health care provider before I take this medicine? They need to know if you have any of these conditions: -difficulty passing urine or very little urine -kidney disease -an unusual or allergic reaction to deferoxamine, other medicines, foods, dyes, or preservatives -pregnant or trying to get pregnant -breast-feeding How should I use this medicine? This medicine is for injection into a muscle, slow infusion into a vein, or infusion under the skin. It is usually given by a health care professional in a hospital or clinic setting. Talk to your pediatrician regarding the use of this medicine in children. While this medicine may be prescribed for children as young as 3 years for selected conditions, precautions do apply. Patients over 54 years old may have a stronger reaction and need a smaller dose. Overdosage: If you think you have taken too much of this medicine contact a poison control center or emergency room at once. NOTE: This medicine is only for you. Do not share this medicine with others. What if I miss a dose? This does not apply. What may  interact with this medicine? Do not take this medicine with any of the following medications: -preparations containing iron This medicine may also interact with the following medications: -ascorbic acid (vitamin C) -gallium-67 - used in certain diagnostic tests -prochlorperazine This list may not describe all possible interactions. Give your health care provider a list of all the medicines, herbs, non-prescription drugs, or dietary supplements you use. Also tell them if you smoke, drink alcohol, or use illegal drugs. Some items may interact with your medicine. What should I watch  for while using this medicine? Your condition will be monitored carefully while you are receiving this medicine. Visit your doctor or health care professional for regular checks on your progress. Tell your doctor or health care professional as soon as you can if you notice any change in your sight or hearing. You may get drowsy or dizzy or have problems with your vision. Do not drive, use machinery, or do anything that needs mental alertness until you know how this medicine affects you. Do not stand or sit up quickly, especially if you are an older patient. This reduces the risk of dizzy or fainting spells. While you are receiving this medicine, do not take any vitamin C products unless your doctor or health care professional tells you to. What side effects may I notice from receiving this medicine? Side effects that you should report to your doctor or health care professional as soon as possible: -allergic reactions like skin rash, itching or hives, swelling of the face, lips, or tongue -breathing problems -change in vision -diarrhea -fast heartbeat -feel dizzy, faint -fever -loss of hearing -muscle cramps -pain, swelling where injected -skin flushing, redness -stomach pain Side effects that usually do not require medical attention (report to your doctor or health care professional if they continue or are bothersome): -red coloration of urine This list may not describe all possible side effects. Call your doctor for medical advice about side effects. You may report side effects to FDA at 1-800-FDA-1088. Where should I keep my medicine? This drug is given in a hospital or clinic and will not be stored at home. NOTE: This sheet is a summary. It may not cover all possible information. If you have questions about this medicine, talk to your doctor, pharmacist, or health care provider.  2012, Elsevier/Gold Standard. (09/20/2007 5:37:06 PM)

## 2012-03-29 ENCOUNTER — Ambulatory Visit (HOSPITAL_BASED_OUTPATIENT_CLINIC_OR_DEPARTMENT_OTHER): Payer: Medicaid Other | Admitting: Medical

## 2012-03-29 ENCOUNTER — Ambulatory Visit: Payer: Medicaid Other

## 2012-03-29 ENCOUNTER — Other Ambulatory Visit (HOSPITAL_BASED_OUTPATIENT_CLINIC_OR_DEPARTMENT_OTHER): Payer: Medicaid Other | Admitting: Lab

## 2012-03-29 VITALS — BP 111/61 | HR 97 | Temp 98.9°F | Resp 18 | Ht 65.0 in | Wt 130.0 lb

## 2012-03-29 DIAGNOSIS — D5700 Hb-SS disease with crisis, unspecified: Secondary | ICD-10-CM

## 2012-03-29 DIAGNOSIS — G4700 Insomnia, unspecified: Secondary | ICD-10-CM

## 2012-03-29 DIAGNOSIS — R110 Nausea: Secondary | ICD-10-CM

## 2012-03-29 DIAGNOSIS — D571 Sickle-cell disease without crisis: Secondary | ICD-10-CM

## 2012-03-29 LAB — RETICULOCYTES (CHCC): ABS Retic: 475.6 10*3/uL — ABNORMAL HIGH (ref 19.0–186.0)

## 2012-03-29 LAB — CBC WITH DIFFERENTIAL (CANCER CENTER ONLY)
BASO%: 0.4 % (ref 0.0–2.0)
Eosinophils Absolute: 0.3 10*3/uL (ref 0.0–0.5)
HCT: 22.1 % — ABNORMAL LOW (ref 38.7–49.9)
LYMPH#: 2.9 10*3/uL (ref 0.9–3.3)
MCV: 99 fL — ABNORMAL HIGH (ref 82–98)
MONO#: 1.4 10*3/uL — ABNORMAL HIGH (ref 0.1–0.9)
NEUT%: 74.7 % (ref 40.0–80.0)
RBC: 2.24 10*6/uL — ABNORMAL LOW (ref 4.20–5.70)
RDW: 23.1 % — ABNORMAL HIGH (ref 11.1–15.7)
WBC: 18.5 10*3/uL — ABNORMAL HIGH (ref 4.0–10.0)

## 2012-03-29 LAB — HOLD TUBE, BLOOD BANK - CHCC SATELLITE

## 2012-03-29 LAB — COMPREHENSIVE METABOLIC PANEL
ALT: 30 U/L (ref 0–53)
CO2: 28 mEq/L (ref 19–32)
Calcium: 8.9 mg/dL (ref 8.4–10.5)
Chloride: 105 mEq/L (ref 96–112)
Creatinine, Ser: 0.46 mg/dL — ABNORMAL LOW (ref 0.50–1.35)
Glucose, Bld: 106 mg/dL — ABNORMAL HIGH (ref 70–99)
Sodium: 137 mEq/L (ref 135–145)
Total Protein: 7.4 g/dL (ref 6.0–8.3)

## 2012-03-29 LAB — FERRITIN: Ferritin: 4476 ng/mL — ABNORMAL HIGH (ref 22–322)

## 2012-03-29 LAB — LACTATE DEHYDROGENASE: LDH: 344 U/L — ABNORMAL HIGH (ref 94–250)

## 2012-03-29 MED ORDER — PROMETHAZINE HCL 25 MG PO TABS: 25.0000 mg | ORAL_TABLET | Freq: Three times a day (TID) | ORAL | Status: DC | PRN

## 2012-03-29 MED ORDER — HEPARIN SOD (PORK) LOCK FLUSH 100 UNIT/ML IV SOLN
500.0000 [IU] | Freq: Once | INTRAVENOUS | Status: AC
  Administered 2012-03-29: 500 [IU] via INTRAVENOUS
  Filled 2012-03-29: qty 5

## 2012-03-29 MED ORDER — TEMAZEPAM 15 MG PO CAPS: 15.0000 mg | ORAL_CAPSULE | Freq: Every evening | ORAL | Status: DC | PRN

## 2012-03-29 MED ORDER — SODIUM CHLORIDE 0.9 % IJ SOLN
10.0000 mL | INTRAMUSCULAR | Status: DC | PRN
  Administered 2012-03-29: 10 mL via INTRAVENOUS
  Filled 2012-03-29: qty 10

## 2012-03-29 NOTE — Progress Notes (Addendum)
Diagnoses: #1, hemoglobin, SS.  Disease. #2 iron overload.  Current therapy: #1 Hydrea, 500 mg by mouth 3 times a day. #2 folic acid 1 mg by mouth daily. #3 EXJADE 500 mg twice a day (total of 1,000mg  daily)  Interim history: Mr. Schwer comes in today for an office followup visit.  Unfortunately, on his last visit here back on October 4.  We found out that Mr. Sahakian had not been taking his Exjade for over one year.  Apparently, every time.  He had office visit with [place].  He was informing us that he was getting his Exjade in taking it.  A phone call was made to the, drug company, and they informed us that they were never able to get in touch with Mr. Boehlke, however, in the past they had shipped Exjade to him.  We saw him last.  His ferritin was extremely high at 5,044.  His iron was 171.  At that time Dr. Myna Hidalgo had a very serious conversation with Mr. Rames and informed him that if he did not start taking his medication, and if his ferritin, and iron level did not get any better, he would not be alive, in 2 years.  Today, Mr. Stankovich, reports, that he was looking around.  His house, and just happened to come across 2 bottles of Exjade about 2 weeks ago.  He, reports, that he has been taking his Exjade for 2 weeks, now.  He is taking one pill twice a day.  When we last saw him we did have to place him on a Desferal pump.  2 g a day.  Subcutaneous for 4 days.  I am curious to see what his ferritin level.  Will be today.  Hopefully, Mr. Pattison is telling us the truth, and is on Exjade.  Since he, reports, that he is on X. date now, we will have to watch his renal function.  He, reports, that he has a good appetite.  He does have some nausea and takes Phenergan for this.  He denies any active, vomiting, diarrhea, constipation, chest pain, shortness of breath, cough, fevers, chills, night sweats.  He denies any odynophagia or dysphasia.  He denies any headaches, vision, changes, rashes.  He denies  any obvious, or abnormal bleeding.  Of note, he does report some insomnia, and would like something for sleep. In terms of his sickle cell disease, in the past.  We have done "mini" exchanges.  He will not let us do a complete exchange transfusion.  This is always a difficult situation.  The best.  We can do for him is "mini" exchanges.  Review of Systems: chronic joint pain, otherwise: Pt. Denies any changes in their vision, hearing, adenopathy, fevers, chills, nausea, vomiting, diarrhea, constipation, chest pain, shortness of breath, passing blood, passing out, blacking out,  any changes in skin, joints, neurologic or psychiatric except as noted.  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  Lungs are clear to auscultation bilaterally. There are no wheezes, rales or rhonci Cardiac is regular rate and rhythm with a normal S1 and S2. There are no murmurs, rubs, or bruits.  Abdomen is soft with good bowel sounds, there is no palpable mass. There is no palpable  hepatosplenomegaly. There is no palpable fluid wave.  Musculoskeletal no tenderness of the spine, ribs, or hips.  Extremities there are no clubbing, cyanosis, or edema.  Skin no petechia, purpura or ecchymosis Neurologic is nonfocal.  Laboratory Data: White count 18.5, hemoglobin 7.6, hematocrit 22.1, MCV 99, platelets 269,000  Current Outpatient Prescriptions on File Prior to Visit  Medication Sig Dispense Refill  . albuterol (PROVENTIL HFA;VENTOLIN HFA) 108 (90 BASE) MCG/ACT inhaler Inhale 2 puffs into the lungs every 6 (six) hours as needed for wheezing.  2 Inhaler  4  . deferasirox (EXJADE) 500 MG disintegrating tablet  Take 1,000 mg by mouth daily.      . diphenhydrAMINE (BENADRYL) 25 mg capsule Take 25 mg by mouth every 6 (six) hours as needed.      . folic acid (FOLVITE) 1 MG tablet Take 1 mg by mouth daily.        Marland Kitchen HYDROmorphone (DILAUDID) 4 MG tablet Take 1 tablet (4 mg total) by mouth every 6 (six) hours as needed. For pain  120 tablet  0  . HYDROmorphone HCl (EXALGO) 12 MG TB24 Take 1 tablet (12 mg total) by mouth daily.  30 tablet  0  . hydroxyurea (HYDREA) 500 MG capsule Take 1 capsule (500 mg total) by mouth 3 (three) times daily.  90 capsule  1  . promethazine (PHENERGAN) 25 MG tablet Take 1 tablet (25 mg total) by mouth every 8 (eight) hours as needed.  90 tablet  4  . warfarin (COUMADIN) 2 MG tablet Take 1 tablet (2 mg total) by mouth daily.  30 tablet  6   Assessment/Plan:  This is a 36 year old, African American, male, with the following issues:  #1, hemoglobin, SS.  Disease.  He does have a fairly aggressive course with this.  We will continue doing "mini" exchange transfusions on him although he does need a complete exchange transfusion, but refuses.  #2 iron overload.  Hopefully, Mr. Hinchman is telling us the truth, and is on Exjade at thousand milligrams a day, now.  Hopefully, we will start seeing his ferritin level decreased.  Again, I did reiterate the importance of staying on the Exjade.  Mr. Dentremont does understand that if his ferritin, and iron do not get better, he will not be alive in 2 years.  Mr. Cohick is quite a challenge and again, it's difficult to say how compliant.  He is with his medications.  #3 intermittent nausea.  I did give him a refill on his Phenergan.  #4 insomnia.  I did give him a prescription for Restoril 15 mg to take before bedtime.  #5.  Followup.  We will follow back up with Mr. Carrizales in 6 weeks, but before then should there be questions or concerns.

## 2012-04-09 ENCOUNTER — Telehealth: Payer: Self-pay | Admitting: Oncology

## 2012-04-09 ENCOUNTER — Encounter: Payer: Self-pay | Admitting: Hematology & Oncology

## 2012-04-09 NOTE — Telephone Encounter (Signed)
Patient called earlier, talked to Amy, left note for Dr. Myna Hidalgo to call.  Patient made aware that return call may be later in the day due to this being his first day back. Patient verbalized understanding. [name], Rmoni Keplinger [employer]

## 2012-04-11 ENCOUNTER — Ambulatory Visit (HOSPITAL_BASED_OUTPATIENT_CLINIC_OR_DEPARTMENT_OTHER): Payer: Medicaid Other | Admitting: Lab

## 2012-04-11 ENCOUNTER — Ambulatory Visit (HOSPITAL_BASED_OUTPATIENT_CLINIC_OR_DEPARTMENT_OTHER): Payer: Medicaid Other

## 2012-04-11 ENCOUNTER — Other Ambulatory Visit: Payer: Self-pay | Admitting: Hematology & Oncology

## 2012-04-11 VITALS — BP 108/53 | HR 90 | Temp 98.3°F | Resp 18

## 2012-04-11 DIAGNOSIS — D571 Sickle-cell disease without crisis: Secondary | ICD-10-CM

## 2012-04-11 DIAGNOSIS — R52 Pain, unspecified: Secondary | ICD-10-CM

## 2012-04-11 DIAGNOSIS — R110 Nausea: Secondary | ICD-10-CM

## 2012-04-11 DIAGNOSIS — D5700 Hb-SS disease with crisis, unspecified: Secondary | ICD-10-CM

## 2012-04-11 DIAGNOSIS — R161 Splenomegaly, not elsewhere classified: Secondary | ICD-10-CM

## 2012-04-11 LAB — CMP (CANCER CENTER ONLY)
ALT(SGPT): 40 U/L (ref 10–47)
CO2: 25 mEq/L (ref 18–33)
Chloride: 106 mEq/L (ref 98–108)
Potassium: 3.7 mEq/L (ref 3.3–4.7)
Sodium: 135 mEq/L (ref 128–145)
Total Bilirubin: 4.9 mg/dl — ABNORMAL HIGH (ref 0.20–1.60)
Total Protein: 7.1 g/dL (ref 6.4–8.1)

## 2012-04-11 LAB — CHCC SATELLITE - SMEAR

## 2012-04-11 LAB — HOLD TUBE, BLOOD BANK - CHCC SATELLITE

## 2012-04-11 LAB — IRON AND TIBC
%SAT: 74 % — ABNORMAL HIGH (ref 20–55)
TIBC: 206 ug/dL — ABNORMAL LOW (ref 215–435)

## 2012-04-11 LAB — CBC WITH DIFFERENTIAL (CANCER CENTER ONLY)
Eosinophils Absolute: 0.4 10*3/uL (ref 0.0–0.5)
LYMPH#: 3.7 10*3/uL — ABNORMAL HIGH (ref 0.9–3.3)
MONO#: 2.2 10*3/uL — ABNORMAL HIGH (ref 0.1–0.9)
MONO%: 10.5 % (ref 0.0–13.0)
NEUT#: 14.4 10*3/uL — ABNORMAL HIGH (ref 1.5–6.5)
Platelets: 312 10*3/uL (ref 145–400)
RBC: 1.88 10*6/uL — ABNORMAL LOW (ref 4.20–5.70)
WBC: 20.9 10*3/uL — ABNORMAL HIGH (ref 4.0–10.0)

## 2012-04-11 LAB — FERRITIN: Ferritin: 4561 ng/mL — ABNORMAL HIGH (ref 22–322)

## 2012-04-11 LAB — PREPARE RBC (CROSSMATCH)

## 2012-04-11 MED ORDER — DIPHENHYDRAMINE HCL 50 MG/ML IJ SOLN
25.0000 mg | Freq: Once | INTRAMUSCULAR | Status: AC
  Administered 2012-04-11: 25 mg via INTRAVENOUS

## 2012-04-11 MED ORDER — SODIUM CHLORIDE 0.9 % IJ SOLN
10.0000 mL | INTRAMUSCULAR | Status: DC | PRN
  Administered 2012-04-11: 10 mL via INTRAVENOUS
  Filled 2012-04-11: qty 10

## 2012-04-11 MED ORDER — HYDROMORPHONE HCL ER 16 MG PO T24A: 16.0000 mg | EXTENDED_RELEASE_TABLET | ORAL | Status: DC

## 2012-04-11 MED ORDER — HYDROMORPHONE HCL PF 4 MG/ML IJ SOLN
4.0000 mg | Freq: Once | INTRAMUSCULAR | Status: AC
  Administered 2012-04-11: 4 mg via INTRAVENOUS

## 2012-04-11 MED ORDER — ONDANSETRON 8 MG/50ML IVPB (CHCC)
8.0000 mg | Freq: Once | INTRAVENOUS | Status: AC
  Administered 2012-04-11: 8 mg via INTRAVENOUS

## 2012-04-11 MED ORDER — PROMETHAZINE HCL 25 MG/ML IJ SOLN
12.5000 mg | Freq: Once | INTRAMUSCULAR | Status: AC
  Administered 2012-04-11: 12.5 mg via INTRAVENOUS

## 2012-04-11 MED ORDER — HYDROMORPHONE HCL 8 MG PO TABS: 8.0000 mg | ORAL_TABLET | ORAL | Status: DC | PRN

## 2012-04-11 MED ORDER — SODIUM CHLORIDE 0.9 % IV SOLN
Freq: Once | INTRAVENOUS | Status: AC
  Administered 2012-04-11: 11:00:00 via INTRAVENOUS

## 2012-04-11 MED ORDER — HEPARIN SOD (PORK) LOCK FLUSH 100 UNIT/ML IV SOLN
500.0000 [IU] | Freq: Once | INTRAVENOUS | Status: AC
  Administered 2012-04-11: 500 [IU] via INTRAVENOUS
  Filled 2012-04-11: qty 5

## 2012-04-11 NOTE — Addendum Note (Signed)
Addended by: Arlan Organ R on: 04/11/2012 06:31 PM   Modules accepted: Level of Service

## 2012-04-11 NOTE — Patient Instructions (Addendum)
Pain Medicine Instructions You have been given a prescription for pain medicines. These medicines may affect your ability to think clearly. They may also affect your ability to perform physical activities. Take these medicines only as needed for pain. You do not need to take them if you are not having pain, unless directed by your caregiver. You can take less than the prescribed dose if you find a smaller amount of medicine controls the pain. It may not be possible to make all of your pain go away, but you should be comfortable enough to move, breathe, and take care of yourself. After you start taking pain medicines, while taking the medicines, and for 8 hours after stopping the medicines:  Do not drive.  Do not operate machinery.  Do not operate power tools.  Do not sign legal documents.  Do not supervise children by yourself.  Do not participate in activities that require climbing or being in high places.  Do not enter a body of water (lake, river, ocean, spa, swimming pool) without an adult nearby who can help you. You may have been prescribed a pain medicine that contains acetaminophen (paracetamol). If so, take only the amount directed by your caregiver. Do not take any other acetaminophen while taking this medicine. An overdose of acetaminophen can result in severe liver damage. If you are taking other medicines, check the active ingredients for acetaminophen. Acetaminophen is found in hundreds of over-the-counter and prescription medicines. These include cold relief products, menstrual cramp relief medicines, fever-reducing medicines, acid indigestion relief products, and pain relief products. HOME CARE INSTRUCTIONS   Do not drink alcohol, take sleeping pills, or take other medicines until at least 8 hours after your last dose of pain medicine, or as directed by your caregiver.  Use a bulk stool softener if you become constipated from your pain medicines. Increasing your intake of fruits  and vegetables will also help.  Write down the times when you take your medicines. Look at the times before taking your next dose of medicine. It is easy to become confused while on pain medicines. Recording the times helps you to avoid an overdose. SEEK MEDICAL CARE IF:  Your medicine is not helping the pain go away.  You vomit or have diarrhea shortly after taking the medicine.  You develop new pain in areas that did not hurt before. SEEK IMMEDIATE MEDICAL CARE IF:  You feel dizzy or faint.  You feel there are other problems that might be caused by your medicine. MAKE SURE YOU:   Understand these instructions.  Will watch your condition.  Will get help right away if you are not doing well or get worse. Document Released: 09/05/2000 Document Revised: 08/22/2011 Document Reviewed: 05/14/2010 [facility] Patient Information 2013 [place], [place].   Dehydration, Adult Dehydration is when you lose more fluids from the body than you take in. Vital organs like the kidneys, brain, and heart cannot function without a proper amount of fluids and salt. Any loss of fluids from the body can cause dehydration.  CAUSES   Vomiting.  Diarrhea.  Excessive sweating.  Excessive urine output.  Fever. SYMPTOMS  Mild dehydration  Thirst.  Dry lips.  Slightly dry mouth. Moderate dehydration  Very dry mouth.  Sunken eyes.  Skin does not bounce back quickly when lightly pinched and released.  Dark urine and decreased urine production.  Decreased tear production.  Headache. Severe dehydration  Very dry mouth.  Extreme thirst.  Rapid, weak pulse (more than 100 beats per minute  at rest).  Cold hands and feet.  Not able to sweat in spite of heat and temperature.  Rapid breathing.  Blue lips.  Confusion and lethargy.  Difficulty being awakened.  Minimal urine production.  No tears. DIAGNOSIS  Your caregiver will diagnose dehydration based on your symptoms and your  exam. Blood and urine tests will help confirm the diagnosis. The diagnostic evaluation should also identify the cause of dehydration. TREATMENT  Treatment of mild or moderate dehydration can often be done at home by increasing the amount of fluids that you drink. It is best to drink small amounts of fluid more often. Drinking too much at one time can make vomiting worse. Refer to the home care instructions below. Severe dehydration needs to be treated at the hospital where you will probably be given intravenous (IV) fluids that contain water and electrolytes. HOME CARE INSTRUCTIONS   Ask your caregiver about specific rehydration instructions.  Drink enough fluids to keep your urine clear or pale yellow.  Drink small amounts frequently if you have nausea and vomiting.  Eat as you normally do.  Avoid:  Foods or drinks high in sugar.  Carbonated drinks.  Juice.  Extremely hot or cold fluids.  Drinks with caffeine.  Fatty, greasy foods.  Alcohol.  Tobacco.  Overeating.  Gelatin desserts.  Wash your hands well to avoid spreading bacteria and viruses.  Only take over-the-counter or prescription medicines for pain, discomfort, or fever as directed by your caregiver.  Ask your caregiver if you should continue all prescribed and over-the-counter medicines.  Keep all follow-up appointments with your caregiver. SEEK MEDICAL CARE IF:  You have abdominal pain and it increases or stays in one area (localizes).  You have a rash, stiff neck, or severe headache.  You are irritable, sleepy, or difficult to awaken.  You are weak, dizzy, or extremely thirsty. SEEK IMMEDIATE MEDICAL CARE IF:   You are unable to keep fluids down or you get worse despite treatment.  You have frequent episodes of vomiting or diarrhea.  You have blood or green matter (bile) in your vomit.  You have blood in your stool or your stool looks black and tarry.  You have not urinated in 6 to 8 hours, or  you have only urinated a small amount of very dark urine.  You have a fever.  You faint. MAKE SURE YOU:   Understand these instructions.  Will watch your condition.  Will get help right away if you are not doing well or get worse. Document Released: 05/30/2005 Document Revised: 08/22/2011 Document Reviewed: 01/17/2011 [facility] Patient Information 2013 [place], [place].

## 2012-04-11 NOTE — Addendum Note (Signed)
Encounter addended by: Baldomero Lamy, RN on: 04/11/2012  1:56 PM<BR>     Documentation filed: Orders

## 2012-04-12 ENCOUNTER — Encounter: Payer: Self-pay | Admitting: *Deleted

## 2012-04-12 ENCOUNTER — Ambulatory Visit (HOSPITAL_BASED_OUTPATIENT_CLINIC_OR_DEPARTMENT_OTHER): Payer: Medicaid Other

## 2012-04-12 VITALS — BP 118/45 | HR 90 | Temp 98.2°F | Resp 18

## 2012-04-12 DIAGNOSIS — D5700 Hb-SS disease with crisis, unspecified: Secondary | ICD-10-CM

## 2012-04-12 DIAGNOSIS — D571 Sickle-cell disease without crisis: Secondary | ICD-10-CM

## 2012-04-12 DIAGNOSIS — D509 Iron deficiency anemia, unspecified: Secondary | ICD-10-CM

## 2012-04-12 DIAGNOSIS — R110 Nausea: Secondary | ICD-10-CM

## 2012-04-12 MED ORDER — ACETAMINOPHEN 325 MG PO TABS
650.0000 mg | ORAL_TABLET | Freq: Once | ORAL | Status: AC
  Administered 2012-04-12: 650 mg via ORAL

## 2012-04-12 MED ORDER — HEPARIN SOD (PORK) LOCK FLUSH 100 UNIT/ML IV SOLN
250.0000 [IU] | INTRAVENOUS | Status: DC | PRN
  Filled 2012-04-12: qty 5

## 2012-04-12 MED ORDER — HEPARIN SOD (PORK) LOCK FLUSH 100 UNIT/ML IV SOLN
500.0000 [IU] | Freq: Every day | INTRAVENOUS | Status: DC | PRN
  Filled 2012-04-12: qty 5

## 2012-04-12 MED ORDER — LORAZEPAM 2 MG/ML IJ SOLN: 0.5000 mg | Freq: Once | INTRAMUSCULAR | Status: DC

## 2012-04-12 MED ORDER — FUROSEMIDE 10 MG/ML IJ SOLN
20.0000 mg | Freq: Once | INTRAMUSCULAR | Status: AC
  Administered 2012-04-12: 20 mg via INTRAVENOUS

## 2012-04-12 MED ORDER — SODIUM CHLORIDE 0.9 % IJ SOLN
10.0000 mL | INTRAMUSCULAR | Status: DC | PRN
  Filled 2012-04-12: qty 10

## 2012-04-12 MED ORDER — PROMETHAZINE HCL 25 MG/ML IJ SOLN
12.5000 mg | Freq: Once | INTRAMUSCULAR | Status: AC
  Administered 2012-04-12: 12.5 mg via INTRAVENOUS

## 2012-04-12 MED ORDER — HYDROMORPHONE HCL PF 4 MG/ML IJ SOLN
4.0000 mg | Freq: Once | INTRAMUSCULAR | Status: AC
  Administered 2012-04-12: 4 mg via INTRAVENOUS

## 2012-04-12 MED ORDER — DIPHENHYDRAMINE HCL 25 MG PO CAPS
25.0000 mg | ORAL_CAPSULE | Freq: Once | ORAL | Status: AC
  Administered 2012-04-12: 25 mg via ORAL

## 2012-04-12 NOTE — Patient Instructions (Signed)
Blood Transfusion Information WHAT IS A BLOOD TRANSFUSION? A transfusion is the replacement of blood or some of its parts. Blood is made up of multiple cells which provide different functions.  Red blood cells carry oxygen and are used for blood loss replacement.  White blood cells fight against infection.  Platelets control bleeding.  Plasma helps clot blood.  Other blood products are available for specialized needs, such as hemophilia or other clotting disorders. BEFORE THE TRANSFUSION  Who gives blood for transfusions?   You may be able to donate blood to be used at a later date on yourself (autologous donation).  Relatives can be asked to donate blood. This is generally not any safer than if you have received blood from a stranger. The same precautions are taken to ensure safety when a relative's blood is donated.  Healthy volunteers who are fully evaluated to make sure their blood is safe. This is blood bank blood. Transfusion therapy is the safest it has ever been in the practice of medicine. Before blood is taken from a donor, a complete history is taken to make sure that person has no history of diseases nor engages in risky social behavior (examples are intravenous drug use or sexual activity with multiple partners). The donor's travel history is screened to minimize risk of transmitting infections, such as malaria. The donated blood is tested for signs of infectious diseases, such as HIV and hepatitis. The blood is then tested to be sure it is compatible with you in order to minimize the chance of a transfusion reaction. If you or a relative donates blood, this is often done in anticipation of surgery and is not appropriate for emergency situations. It takes many days to process the donated blood. RISKS AND COMPLICATIONS Although transfusion therapy is very safe and saves many lives, the main dangers of transfusion include:   Getting an infectious disease.  Developing a  transfusion reaction. This is an allergic reaction to something in the blood you were given. Every precaution is taken to prevent this. The decision to have a blood transfusion has been considered carefully by your caregiver before blood is given. Blood is not given unless the benefits outweigh the risks. AFTER THE TRANSFUSION  Right after receiving a blood transfusion, you will usually feel much better and more energetic. This is especially true if your red blood cells have gotten low (anemic). The transfusion raises the level of the red blood cells which carry oxygen, and this usually causes an energy increase.  The nurse administering the transfusion will monitor you carefully for complications. HOME CARE INSTRUCTIONS  No special instructions are needed after a transfusion. You may find your energy is better. Speak with your caregiver about any limitations on activity for underlying diseases you may have. SEEK MEDICAL CARE IF:   Your condition is not improving after your transfusion.  You develop redness or irritation at the intravenous (IV) site. SEEK IMMEDIATE MEDICAL CARE IF:  Any of the following symptoms occur over the next 12 hours:  Shaking chills.  You have a temperature by mouth above 102 F (38.9 C), not controlled by medicine.  Chest, back, or muscle pain.  People around you feel you are not acting correctly or are confused.  Shortness of breath or difficulty breathing.  Dizziness and fainting.  You get a rash or develop hives.  You have a decrease in urine output.  Your urine turns a dark color or changes to pink, red, or brown. Any of the following   symptoms occur over the next 10 days:  You have a temperature by mouth above 102 F (38.9 C), not controlled by medicine.  Shortness of breath.  Weakness after normal activity.  The white part of the eye turns yellow (jaundice).  You have a decrease in the amount of urine or are urinating less often.  Your  urine turns a dark color or changes to pink, red, or brown. Document Released: 05/27/2000 Document Revised: 08/22/2011 Document Reviewed: 01/14/2008 [employer] Patient Information 2013 [employer].  

## 2012-04-12 NOTE — Progress Notes (Signed)
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 3. Chronic left-sided abdominal pain. 4. Iron overload.  CURRENT THERAPY: 1. Hydrea 500 mg p.o. t.i.d. 2. Folic acid 1 mg p.o. daily. 3. Exjade 1000 mg p.o. daily (I am not sure if the patient is actually     taking this or not).  INTERIM HISTORY:  [name] was in getting fluids today.  He is having more problems pain wise.  The pain is over along the left side. 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  His spleen measured 18 cm.  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  He is hemolyzing briskly.  He had a CBC done today which showed 19% retic count.  His hemoglobin was 6.6 with hematocrit of 18.5.  He is requiring more frequent dosing of pain medications.  He is on Exalgo 12 mg a day.  I will increase him up to 16 mg a day.  I will increase his Dilaudid up to 8 mg q.4 hours p.r.n.  He has had no fever.  He has had no real pains in his bones.  Again, most of the pain is on his left side.  PHYSICAL EXAMINATION:  Vital Signs:  He is afebrile.  His pulse was 90. Blood pressure was 108/53.  On his exam, his lungs were clear bilaterally.  Cardiac Exam:  Regular rate and rhythm with a normal S1, S2.  There are no murmurs, rubs or bruits.  Abdominal Exam:  Soft.  His spleen is palpable just below the left costal margin.  There is some tenderness in the left side.  There is no palpable hepatomegaly. Extremities:  Show no clubbing, cyanosis or edema.  Neurologic Exam:  No focal neurological deficits.  We will go ahead and transfuse him tomorrow.  I will try to do some exchanges on him next week.  Of note is his ferritin was 4560.  He says he is still not getting his Exjade.  I have no idea who to believe.  The company says that they call him, but he does answer his phone calls.  I want to try to have the Exjade sent to our  office, and then we will give it to him ourselves.  I will not give him any further pain medications unless he gets his Exjade.  He understands that if he does not get his iron levels down that he will not survive and that the iron will eventually overtake his organs.  Again, I am worried that he is going to need to have his spleen taken out.  Again, his spleen is quite large for a sickle cell patient his age.    ______________________________ [name], M.D. PRE/MEDQ  D:  04/11/2012  T:  04/12/2012  Job:  [id number]

## 2012-04-12 NOTE — Progress Notes (Signed)
Received a "Medication Change Request" fax from [facility] pharmacy regarding his promethazine 25 mg rx. This was refilled on 03/29/12 and has requested another refill stating he is taking it different from the directions and he is almost out. Reviewed with Dr Myna Hidalgo. He is to be taking it as prescribed every 8 hours PRN - not any more frequently. The pharmacist was made aware. He can get it refilled again in November when it was actually due.

## 2012-04-13 LAB — TYPE AND SCREEN: Unit division: 0

## 2012-04-14 ENCOUNTER — Other Ambulatory Visit: Payer: Self-pay | Admitting: Hematology & Oncology

## 2012-04-14 DIAGNOSIS — D571 Sickle-cell disease without crisis: Secondary | ICD-10-CM

## 2012-04-14 DIAGNOSIS — D5700 Hb-SS disease with crisis, unspecified: Secondary | ICD-10-CM

## 2012-04-16 ENCOUNTER — Encounter (HOSPITAL_COMMUNITY)
Admission: RE | Admit: 2012-04-16 | Discharge: 2012-04-16 | Disposition: A | Discharge status: Home / Self Care | Payer: Medicaid Other | Source: Ambulatory Visit | Attending: Hematology & Oncology | Admitting: Hematology & Oncology

## 2012-04-16 ENCOUNTER — Other Ambulatory Visit: Payer: Medicaid Other | Admitting: Lab

## 2012-04-16 ENCOUNTER — Telehealth: Payer: Self-pay | Admitting: Hematology & Oncology

## 2012-04-16 DIAGNOSIS — D571 Sickle-cell disease without crisis: Secondary | ICD-10-CM | POA: Insufficient documentation

## 2012-04-16 NOTE — Telephone Encounter (Signed)
Pt aware of this weeks appointments

## 2012-04-18 ENCOUNTER — Other Ambulatory Visit (HOSPITAL_BASED_OUTPATIENT_CLINIC_OR_DEPARTMENT_OTHER): Payer: Medicaid Other | Admitting: Lab

## 2012-04-18 ENCOUNTER — Other Ambulatory Visit: Payer: Self-pay | Admitting: *Deleted

## 2012-04-18 ENCOUNTER — Ambulatory Visit (HOSPITAL_BASED_OUTPATIENT_CLINIC_OR_DEPARTMENT_OTHER): Payer: Medicaid Other

## 2012-04-18 DIAGNOSIS — R110 Nausea: Secondary | ICD-10-CM

## 2012-04-18 DIAGNOSIS — D571 Sickle-cell disease without crisis: Secondary | ICD-10-CM

## 2012-04-18 DIAGNOSIS — D5700 Hb-SS disease with crisis, unspecified: Secondary | ICD-10-CM

## 2012-04-18 LAB — CBC WITH DIFFERENTIAL (CANCER CENTER ONLY)
BASO%: 0.4 % (ref 0.0–2.0)
EOS%: 1.2 % (ref 0.0–7.0)
HCT: 22.9 % — ABNORMAL LOW (ref 38.7–49.9)
LYMPH#: 4.1 10*3/uL — ABNORMAL HIGH (ref 0.9–3.3)
MCHC: 34.9 g/dL (ref 32.0–35.9)
MONO#: 2.3 10*3/uL — ABNORMAL HIGH (ref 0.1–0.9)
NEUT#: 13.6 10*3/uL — ABNORMAL HIGH (ref 1.5–6.5)
NEUT%: 66.8 % (ref 40.0–80.0)
RDW: 19.6 % — ABNORMAL HIGH (ref 11.1–15.7)
WBC: 20.3 10*3/uL — ABNORMAL HIGH (ref 4.0–10.0)

## 2012-04-18 LAB — RETICULOCYTES (CHCC)
ABS Retic: 234.7 10*3/uL — ABNORMAL HIGH (ref 19.0–186.0)
RBC.: 2.47 MIL/uL — ABNORMAL LOW (ref 4.22–5.81)
Retic Ct Pct: 9.5 % — ABNORMAL HIGH (ref 0.4–2.3)

## 2012-04-18 LAB — PREPARE RBC (CROSSMATCH)

## 2012-04-18 LAB — FERRITIN: Ferritin: 4587 ng/mL — ABNORMAL HIGH (ref 22–322)

## 2012-04-18 MED ORDER — HEPARIN SOD (PORK) LOCK FLUSH 100 UNIT/ML IV SOLN
500.0000 [IU] | Freq: Once | INTRAVENOUS | Status: AC
  Administered 2012-04-18: 500 [IU] via INTRAVENOUS
  Filled 2012-04-18: qty 5

## 2012-04-18 MED ORDER — PROMETHAZINE HCL 25 MG/ML IJ SOLN
25.0000 mg | Freq: Four times a day (QID) | INTRAMUSCULAR | Status: DC | PRN
  Administered 2012-04-18: 25 mg via INTRAVENOUS

## 2012-04-18 MED ORDER — HYDROMORPHONE HCL PF 4 MG/ML IJ SOLN
4.0000 mg | INTRAMUSCULAR | Status: DC | PRN
  Administered 2012-04-18 (×2): 4 mg via INTRAVENOUS

## 2012-04-18 MED ORDER — HEPARIN SOD (PORK) LOCK FLUSH 100 UNIT/ML IV SOLN
250.0000 [IU] | INTRAVENOUS | Status: DC | PRN
  Filled 2012-04-18: qty 5

## 2012-04-18 MED ORDER — DEFERASIROX 500 MG PO TBSO: 1000.0000 mg | ORAL_TABLET | Freq: Every day | ORAL | Status: DC

## 2012-04-18 MED ORDER — DIPHENHYDRAMINE HCL 25 MG PO CAPS
25.0000 mg | ORAL_CAPSULE | Freq: Once | ORAL | Status: AC
  Administered 2012-04-18: 25 mg via ORAL

## 2012-04-18 MED ORDER — ACETAMINOPHEN 325 MG PO TABS
650.0000 mg | ORAL_TABLET | Freq: Once | ORAL | Status: AC
  Administered 2012-04-18: 650 mg via ORAL

## 2012-04-18 MED ORDER — SODIUM CHLORIDE 0.9 % IJ SOLN
10.0000 mL | INTRAMUSCULAR | Status: AC | PRN
  Administered 2012-04-18: 10 mL
  Filled 2012-04-18: qty 10

## 2012-04-18 MED ORDER — SODIUM CHLORIDE 0.9 % IV SOLN
INTRAVENOUS | Status: DC
  Administered 2012-04-18: 10:00:00 via INTRAVENOUS

## 2012-04-18 NOTE — Progress Notes (Signed)
Christopher Warren presents today for phlebotomy per MD orders. Phlebotomy procedure started at 1000 and ended at 1020. 480 mls  removed. Patient observed for 30 minutes after procedure without any incident. To receive one unit PRBCs. Patient tolerated procedure well. IV needle removed intact.

## 2012-04-18 NOTE — Telephone Encounter (Signed)
Pt stated that he was about to run out of his Exjade. Christopher Warren spoke to an [facility] rep who stated they have not shipped out a refill because they had a question about the rx they received on 03/16/12. The previous dose was 1000 mg per day and the new dose was 1125 mg per day (Of note: no phone calls or faxes have been received with this request. When asked why the office hadn't been contacted, there wasn't an answer). Could not find any record of this in the system therefore asked to have a copy faxed to the office but the rep stated they were unable to do so. Reviewed with Dr Myna Hidalgo. He is to stay on Exjade 1000 mg daily. Faxed updated/confirmed rx to [phone].

## 2012-04-18 NOTE — Patient Instructions (Signed)
Therapeutic Phlebotomy Care After Refer to this sheet in the next few weeks. These instructions provide you with information on caring for yourself after your procedure. Your caregiver may also give you more specific instructions. Your treatment has been planned according to current medical practices, but problems sometimes occur. Call your caregiver if you have any problems or questions after your procedure. HOME CARE INSTRUCTIONS Most people can go back to their normal activities right away. Before you leave, be sure to ask if there is anything you should or should not do. In general, it would be wise to:  Keep the bandage dry. You can remove the bandage after about 5 hours.  Eat well-balanced meals for the next 24 hours.  Drink enough fluids to keep your urine clear or pale yellow.  Avoid drinking alcohol minimally until after eating.  Avoid smoking for at least 30 minutes after the procedure.  Avoid strenous physical activity or heavy lifting or pulling for about 5 hours after the procedure.  Athletes should avoid strenous exercise for 12 hours or more.  Change positions slowly for the remainder of the day to prevent lightheadedness or fainting.  If you feel lightheaded, lie down until the feeling subsides.  If you have bleeding from the needle insertion site, elevate your arm and press firmly on the site until the bleeding stops.  If bruising or bleeding appears under the skin, apply ice to the area for 15 to 20 minutes, 3 to 4 times per day. Put the ice in a plastic bag and place a towel between the bag of ice and your skin. Do this while you are awake for the first 24 hours. The ice packs can be stopped before 24 hours if the swelling goes away. If swelling persists after 24 hours, a warm, moist washcloth can be applied to the area for 15 to 20 minutes, 3 to 4 times per day. The warm, moist treatments can be stopped when the swelling goes away.  It is important to continue further  therapeutic phlebotomy as directed by your caregiver. SEEK MEDICAL CARE IF:  There is bleeding or fluid leaking from the needle insertion site.  The needle insertion site becomes swollen, red, or sore.  You feel lightheaded, dizzy or nauseated, and the feeling does not go away.  You notice new bruising at the needle insertion site.  You feel more weak or tired than normal.  You develop a fever. SEEK IMMEDIATE MEDICAL CARE IF:   There is increased bleeding, pain, or swelling from the needle insertion site.  You have severe nausea or vomiting.  You have chest pain.  You have trouble breathing. MAKE SURE YOU:  Understand these instructions.  Will watch your condition.  Will get help right away if you are not doing well or get worse. Document Released: 11/01/2010 Document Revised: 08/22/2011 Document Reviewed: 11/01/2010 [facility] Patient Information 2013 [place], [place]. Blood Transfusion  A blood transfusion replaces your blood or some of its parts. Blood is replaced when you have lost blood because of surgery, an accident, or for severe blood conditions like anemia. You can donate blood to be used on yourself if you have a planned surgery. If you lose blood during that surgery, your own blood can be given back to you. Any blood given to you is checked to make sure it matches your blood type. Your temperature, blood pressure, and heart rate (vital signs) will be checked often.  GET HELP RIGHT AWAY IF:   You feel sick  to your stomach (nauseous) or throw up (vomit).  You have watery poop (diarrhea).  You have shortness of breath or trouble breathing.  You have blood in your pee (urine) or have dark colored pee.  You have chest pain or tightness.  Your eyes or skin turn yellow (jaundice).  You have a temperature by mouth above 102 F (38.9 C), not controlled by medicine.  You start to shake and have chills.  You develop a a red rash (hives) or feel itchy.  You  develop lightheadedness or feel confused.  You develop back, joint, or muscle pain.  You do not feel hungry (lost appetite).  You feel tired, restless, or nervous.  You develop belly (abdominal) cramps. Document Released: 08/26/2008 Document Revised: 08/22/2011 Document Reviewed: 08/26/2008 [facility] Patient Information 2013 [place], [place].

## 2012-04-19 ENCOUNTER — Other Ambulatory Visit: Payer: Medicaid Other | Admitting: Lab

## 2012-04-19 ENCOUNTER — Ambulatory Visit (HOSPITAL_BASED_OUTPATIENT_CLINIC_OR_DEPARTMENT_OTHER): Payer: Medicaid Other

## 2012-04-19 DIAGNOSIS — D5700 Hb-SS disease with crisis, unspecified: Secondary | ICD-10-CM

## 2012-04-19 DIAGNOSIS — D571 Sickle-cell disease without crisis: Secondary | ICD-10-CM

## 2012-04-19 MED ORDER — HYDROMORPHONE HCL PF 4 MG/ML IJ SOLN
4.0000 mg | Freq: Once | INTRAMUSCULAR | Status: AC
  Administered 2012-04-19: 4 mg via INTRAVENOUS

## 2012-04-19 MED ORDER — HEPARIN SOD (PORK) LOCK FLUSH 100 UNIT/ML IV SOLN
500.0000 [IU] | Freq: Every day | INTRAVENOUS | Status: AC | PRN
  Administered 2012-04-19: 500 [IU]
  Filled 2012-04-19: qty 5

## 2012-04-19 MED ORDER — DIPHENHYDRAMINE HCL 25 MG PO CAPS
50.0000 mg | ORAL_CAPSULE | Freq: Once | ORAL | Status: AC
  Administered 2012-04-19: 50 mg via ORAL

## 2012-04-19 MED ORDER — PROMETHAZINE HCL 25 MG/ML IJ SOLN
25.0000 mg | Freq: Four times a day (QID) | INTRAMUSCULAR | Status: DC | PRN
  Administered 2012-04-19: 25 mg via INTRAVENOUS

## 2012-04-19 MED ORDER — SODIUM CHLORIDE 0.9 % IJ SOLN
10.0000 mL | INTRAMUSCULAR | Status: AC | PRN
  Administered 2012-04-19: 10 mL
  Filled 2012-04-19: qty 10

## 2012-04-19 MED ORDER — ACETAMINOPHEN 325 MG PO TABS
650.0000 mg | ORAL_TABLET | Freq: Once | ORAL | Status: AC
  Administered 2012-04-19: 650 mg via ORAL

## 2012-04-19 MED ORDER — SODIUM CHLORIDE 0.9 % IV SOLN
250.0000 mL | Freq: Once | INTRAVENOUS | Status: AC
Start: 2012-04-19 — End: 2012-04-19
  Administered 2012-04-19: 250 mL via INTRAVENOUS

## 2012-04-19 NOTE — Progress Notes (Signed)
Christopher Warren presents today for phlebotomy per MD orders. Phlebotomy procedure started at 1005 and ended at 1030. 500 mls removed. Patient observed for 30 minutes after procedure without any incident. Patient tolerated procedure well. Pt to receive 1 unit PRBCs. IV needle removed intact.

## 2012-04-19 NOTE — Patient Instructions (Signed)
Therapeutic Phlebotomy Care After Refer to this sheet in the next few weeks. These instructions provide you with information on caring for yourself after your procedure. Your caregiver may also give you more specific instructions. Your treatment has been planned according to current medical practices, but problems sometimes occur. Call your caregiver if you have any problems or questions after your procedure. HOME CARE INSTRUCTIONS Most people can go back to their normal activities right away. Before you leave, be sure to ask if there is anything you should or should not do. In general, it would be wise to:  Keep the bandage dry. You can remove the bandage after about 5 hours.  Eat well-balanced meals for the next 24 hours.  Drink enough fluids to keep your urine clear or pale yellow.  Avoid drinking alcohol minimally until after eating.  Avoid smoking for at least 30 minutes after the procedure.  Avoid strenous physical activity or heavy lifting or pulling for about 5 hours after the procedure.  Athletes should avoid strenous exercise for 12 hours or more.  Change positions slowly for the remainder of the day to prevent lightheadedness or fainting.  If you feel lightheaded, lie down until the feeling subsides.  If you have bleeding from the needle insertion site, elevate your arm and press firmly on the site until the bleeding stops.  If bruising or bleeding appears under the skin, apply ice to the area for 15 to 20 minutes, 3 to 4 times per day. Put the ice in a plastic bag and place a towel between the bag of ice and your skin. Do this while you are awake for the first 24 hours. The ice packs can be stopped before 24 hours if the swelling goes away. If swelling persists after 24 hours, a warm, moist washcloth can be applied to the area for 15 to 20 minutes, 3 to 4 times per day. The warm, moist treatments can be stopped when the swelling goes away.  It is important to continue further  therapeutic phlebotomy as directed by your caregiver. SEEK MEDICAL CARE IF:  There is bleeding or fluid leaking from the needle insertion site.  The needle insertion site becomes swollen, red, or sore.  You feel lightheaded, dizzy or nauseated, and the feeling does not go away.  You notice new bruising at the needle insertion site.  You feel more weak or tired than normal.  You develop a fever. SEEK IMMEDIATE MEDICAL CARE IF:   There is increased bleeding, pain, or swelling from the needle insertion site.  You have severe nausea or vomiting.  You have chest pain.  You have trouble breathing. MAKE SURE YOU:  Understand these instructions.  Will watch your condition.  Will get help right away if you are not doing well or get worse. Document Released: 11/01/2010 Document Revised: 08/22/2011 Document Reviewed: 11/01/2010 [employer] Patient Information 2013 [employer]. Blood Transfusion  A blood transfusion replaces your blood or some of its parts. Blood is replaced when you have lost blood because of surgery, an accident, or for severe blood conditions like anemia. You can donate blood to be used on yourself if you have a planned surgery. If you lose blood during that surgery, your own blood can be given back to you. Any blood given to you is checked to make sure it matches your blood type. Your temperature, blood pressure, and heart rate (vital signs) will be checked often.  GET HELP RIGHT AWAY IF:   You feel sick   to your stomach (nauseous) or throw up (vomit).  You have watery poop (diarrhea).  You have shortness of breath or trouble breathing.  You have blood in your pee (urine) or have dark colored pee.  You have chest pain or tightness.  Your eyes or skin turn yellow (jaundice).  You have a temperature by mouth above 102 F (38.9 C), not controlled by medicine.  You start to shake and have chills.  You develop a a red rash (hives) or feel itchy.  You  develop lightheadedness or feel confused.  You develop back, joint, or muscle pain.  You do not feel hungry (lost appetite).  You feel tired, restless, or nervous.  You develop belly (abdominal) cramps. Document Released: 08/26/2008 Document Revised: 08/22/2011 Document Reviewed: 08/26/2008 [employer] Patient Information 2013 [employer].  

## 2012-04-20 ENCOUNTER — Ambulatory Visit (HOSPITAL_BASED_OUTPATIENT_CLINIC_OR_DEPARTMENT_OTHER): Payer: Medicaid Other

## 2012-04-20 ENCOUNTER — Other Ambulatory Visit: Payer: Self-pay | Admitting: *Deleted

## 2012-04-20 ENCOUNTER — Other Ambulatory Visit: Payer: Medicaid Other | Admitting: Lab

## 2012-04-20 VITALS — BP 102/67 | HR 72 | Temp 97.0°F | Resp 20

## 2012-04-20 DIAGNOSIS — D571 Sickle-cell disease without crisis: Secondary | ICD-10-CM

## 2012-04-20 DIAGNOSIS — Z23 Encounter for immunization: Secondary | ICD-10-CM

## 2012-04-20 DIAGNOSIS — D5700 Hb-SS disease with crisis, unspecified: Secondary | ICD-10-CM

## 2012-04-20 MED ORDER — SODIUM CHLORIDE 0.9 % IJ SOLN
10.0000 mL | INTRAMUSCULAR | Status: AC | PRN
  Administered 2012-04-20: 10 mL
  Filled 2012-04-20: qty 10

## 2012-04-20 MED ORDER — FUROSEMIDE 10 MG/ML IJ SOLN: 20.0000 mg | Freq: Once | INTRAMUSCULAR | Status: DC

## 2012-04-20 MED ORDER — HAEMOPHILUS B POLYSAC CONJ VAC IM SOLR: 0.5000 mL | Freq: Once | INTRAMUSCULAR | Status: DC

## 2012-04-20 MED ORDER — PROMETHAZINE HCL 25 MG/ML IJ SOLN
25.0000 mg | Freq: Four times a day (QID) | INTRAMUSCULAR | Status: DC | PRN
  Administered 2012-04-20: 25 mg via INTRAVENOUS

## 2012-04-20 MED ORDER — ACETAMINOPHEN 325 MG PO TABS
650.0000 mg | ORAL_TABLET | Freq: Once | ORAL | Status: AC
  Administered 2012-04-20: 650 mg via ORAL

## 2012-04-20 MED ORDER — HEPARIN SOD (PORK) LOCK FLUSH 100 UNIT/ML IV SOLN
500.0000 [IU] | Freq: Every day | INTRAVENOUS | Status: DC | PRN
  Filled 2012-04-20: qty 5

## 2012-04-20 MED ORDER — HYDROMORPHONE HCL PF 4 MG/ML IJ SOLN
4.0000 mg | Freq: Once | INTRAMUSCULAR | Status: AC
  Administered 2012-04-20: 4 mg via INTRAVENOUS

## 2012-04-20 MED ORDER — SODIUM CHLORIDE 0.9 % IV SOLN
250.0000 mL | Freq: Once | INTRAVENOUS | Status: AC
  Administered 2012-04-20: 250 mL via INTRAVENOUS

## 2012-04-20 MED ORDER — DIPHENHYDRAMINE HCL 25 MG PO CAPS
25.0000 mg | ORAL_CAPSULE | Freq: Once | ORAL | Status: AC
  Administered 2012-04-20: 25 mg via ORAL

## 2012-04-20 NOTE — Patient Instructions (Signed)
Blood Transfusion Information WHAT IS A BLOOD TRANSFUSION? A transfusion is the replacement of blood or some of its parts. Blood is made up of multiple cells which provide different functions.  Red blood cells carry oxygen and are used for blood loss replacement.  White blood cells fight against infection.  Platelets control bleeding.  Plasma helps clot blood.  Other blood products are available for specialized needs, such as hemophilia or other clotting disorders. BEFORE THE TRANSFUSION  Who gives blood for transfusions?   You may be able to donate blood to be used at a later date on yourself (autologous donation).  Relatives can be asked to donate blood. This is generally not any safer than if you have received blood from a stranger. The same precautions are taken to ensure safety when a relative's blood is donated.  Healthy volunteers who are fully evaluated to make sure their blood is safe. This is blood bank blood. Transfusion therapy is the safest it has ever been in the practice of medicine. Before blood is taken from a donor, a complete history is taken to make sure that person has no history of diseases nor engages in risky social behavior (examples are intravenous drug use or sexual activity with multiple partners). The donor's travel history is screened to minimize risk of transmitting infections, such as malaria. The donated blood is tested for signs of infectious diseases, such as HIV and hepatitis. The blood is then tested to be sure it is compatible with you in order to minimize the chance of a transfusion reaction. If you or a relative donates blood, this is often done in anticipation of surgery and is not appropriate for emergency situations. It takes many days to process the donated blood. RISKS AND COMPLICATIONS Although transfusion therapy is very safe and saves many lives, the main dangers of transfusion include:   Getting an infectious disease.  Developing a  transfusion reaction. This is an allergic reaction to something in the blood you were given. Every precaution is taken to prevent this. The decision to have a blood transfusion has been considered carefully by your caregiver before blood is given. Blood is not given unless the benefits outweigh the risks. AFTER THE TRANSFUSION  Right after receiving a blood transfusion, you will usually feel much better and more energetic. This is especially true if your red blood cells have gotten low (anemic). The transfusion raises the level of the red blood cells which carry oxygen, and this usually causes an energy increase.  The nurse administering the transfusion will monitor you carefully for complications. HOME CARE INSTRUCTIONS  No special instructions are needed after a transfusion. You may find your energy is better. Speak with your caregiver about any limitations on activity for underlying diseases you may have. SEEK MEDICAL CARE IF:   Your condition is not improving after your transfusion.  You develop redness or irritation at the intravenous (IV) site. SEEK IMMEDIATE MEDICAL CARE IF:  Any of the following symptoms occur over the next 12 hours:  Shaking chills.  You have a temperature by mouth above 102 F (38.9 C), not controlled by medicine.  Chest, back, or muscle pain.  People around you feel you are not acting correctly or are confused.  Shortness of breath or difficulty breathing.  Dizziness and fainting.  You get a rash or develop hives.  You have a decrease in urine output.  Your urine turns a dark color or changes to pink, red, or brown. Any of the following   symptoms occur over the next 10 days:  You have a temperature by mouth above 102 F (38.9 C), not controlled by medicine.  Shortness of breath.  Weakness after normal activity.  The white part of the eye turns yellow (jaundice).  You have a decrease in the amount of urine or are urinating less often.  Your  urine turns a dark color or changes to pink, red, or brown. Document Released: 05/27/2000 Document Revised: 08/22/2011 Document Reviewed: 01/14/2008 [employer] Patient Information 2013 [employer].  

## 2012-04-20 NOTE — Progress Notes (Signed)
Christopher Warren presented for Portacath access and flush. Proper placement of portacath confirmed by CXR. Portacath located  chest wall accessed with  H 19 needle. Good blood return present. Portacath flushed with 20ml NS and 500U/22ml Heparin and needle removed intact. Procedure without incident. Patient tolerated procedure well.

## 2012-04-21 LAB — TYPE AND SCREEN
Antibody Screen: NEGATIVE
Unit division: 0

## 2012-04-22 ENCOUNTER — Other Ambulatory Visit: Payer: Self-pay | Admitting: Hematology & Oncology

## 2012-04-22 DIAGNOSIS — D571 Sickle-cell disease without crisis: Secondary | ICD-10-CM

## 2012-04-23 ENCOUNTER — Encounter (HOSPITAL_COMMUNITY): Payer: Self-pay | Admitting: *Deleted

## 2012-04-23 ENCOUNTER — Other Ambulatory Visit: Payer: Medicaid Other | Admitting: Lab

## 2012-04-23 ENCOUNTER — Encounter: Payer: Self-pay | Admitting: Hematology & Oncology

## 2012-04-23 ENCOUNTER — Inpatient Hospital Stay (HOSPITAL_COMMUNITY)
Admission: AD | Admit: 2012-04-23 | Discharge: 2012-05-03 | DRG: 800 | Disposition: A | Discharge status: Home / Self Care | Payer: Medicaid Other | Source: Ambulatory Visit | Attending: Hematology & Oncology | Admitting: Hematology & Oncology

## 2012-04-23 DIAGNOSIS — Z79899 Other long term (current) drug therapy: Secondary | ICD-10-CM

## 2012-04-23 DIAGNOSIS — R17 Unspecified jaundice: Secondary | ICD-10-CM | POA: Diagnosis present

## 2012-04-23 DIAGNOSIS — D72829 Elevated white blood cell count, unspecified: Secondary | ICD-10-CM | POA: Diagnosis present

## 2012-04-23 DIAGNOSIS — R161 Splenomegaly, not elsewhere classified: Secondary | ICD-10-CM | POA: Diagnosis present

## 2012-04-23 DIAGNOSIS — R011 Cardiac murmur, unspecified: Secondary | ICD-10-CM | POA: Diagnosis present

## 2012-04-23 DIAGNOSIS — J45909 Unspecified asthma, uncomplicated: Secondary | ICD-10-CM | POA: Diagnosis present

## 2012-04-23 DIAGNOSIS — G8929 Other chronic pain: Secondary | ICD-10-CM | POA: Diagnosis present

## 2012-04-23 DIAGNOSIS — D7389 Other diseases of spleen: Secondary | ICD-10-CM | POA: Diagnosis present

## 2012-04-23 DIAGNOSIS — D5700 Hb-SS disease with crisis, unspecified: Secondary | ICD-10-CM

## 2012-04-23 DIAGNOSIS — G8918 Other acute postprocedural pain: Secondary | ICD-10-CM | POA: Diagnosis not present

## 2012-04-23 DIAGNOSIS — Z7901 Long term (current) use of anticoagulants: Secondary | ICD-10-CM

## 2012-04-23 DIAGNOSIS — D571 Sickle-cell disease without crisis: Principal | ICD-10-CM | POA: Diagnosis present

## 2012-04-23 DIAGNOSIS — F17200 Nicotine dependence, unspecified, uncomplicated: Secondary | ICD-10-CM | POA: Diagnosis present

## 2012-04-23 LAB — RETICULOCYTES
RBC.: 2.49 MIL/uL — ABNORMAL LOW (ref 4.22–5.81)
Retic Ct Pct: 9.7 % — ABNORMAL HIGH (ref 0.4–3.1)

## 2012-04-23 LAB — CBC WITH DIFFERENTIAL/PLATELET
Basophils Relative: 0 % (ref 0–1)
Eosinophils Absolute: 0.4 10*3/uL (ref 0.0–0.7)
MCH: 31.7 pg (ref 26.0–34.0)
MCHC: 35.1 g/dL (ref 30.0–36.0)
Neutrophils Relative %: 68 % (ref 43–77)
Platelets: 256 10*3/uL (ref 150–400)

## 2012-04-23 LAB — PREPARE RBC (CROSSMATCH)

## 2012-04-23 LAB — CREATININE, SERUM: GFR calc Af Amer: >90 mL/min (ref >90)

## 2012-04-23 MED ORDER — WARFARIN - PHYSICIAN DOSING INPATIENT: Freq: Every day | Status: DC

## 2012-04-23 MED ORDER — WARFARIN SODIUM 2 MG PO TABS
2.0000 mg | ORAL_TABLET | Freq: Every day | ORAL | Status: DC
  Administered 2012-04-24 – 2012-04-25 (×2): 2 mg via ORAL
  Filled 2012-04-23 (×3): qty 1

## 2012-04-23 MED ORDER — HYDROMORPHONE HCL ER 8 MG PO T24A
16.0000 mg | EXTENDED_RELEASE_TABLET | ORAL | Status: DC
  Administered 2012-04-24 – 2012-05-03 (×9): 16 mg via ORAL
  Filled 2012-04-23 (×9): qty 2

## 2012-04-23 MED ORDER — HEPARIN SOD (PORK) LOCK FLUSH 100 UNIT/ML IV SOLN
250.0000 [IU] | INTRAVENOUS | Status: DC | PRN
Start: 2012-04-23 — End: 2012-04-23

## 2012-04-23 MED ORDER — HEPARIN SOD (PORK) LOCK FLUSH 100 UNIT/ML IV SOLN
250.0000 [IU] | INTRAVENOUS | Status: DC | PRN
Start: 2012-04-23 — End: 2012-05-03

## 2012-04-23 MED ORDER — SODIUM CHLORIDE 0.9 % IV SOLN
250.0000 mL | Freq: Once | INTRAVENOUS | Status: DC
Start: 2012-04-23 — End: 2012-04-23

## 2012-04-23 MED ORDER — TEMAZEPAM 15 MG PO CAPS
15.0000 mg | ORAL_CAPSULE | Freq: Every evening | ORAL | Status: DC | PRN
  Administered 2012-04-23 – 2012-04-27 (×4): 15 mg via ORAL
  Filled 2012-04-23 (×5): qty 1

## 2012-04-23 MED ORDER — SODIUM CHLORIDE 0.9 % IV SOLN: 250.0000 mL | Freq: Once | INTRAVENOUS | Status: DC

## 2012-04-23 MED ORDER — DEFERASIROX 500 MG PO TBSO: 1000.0000 mg | ORAL_TABLET | Freq: Every day | ORAL | Status: DC

## 2012-04-23 MED ORDER — HEPARIN SOD (PORK) LOCK FLUSH 100 UNIT/ML IV SOLN
500.0000 [IU] | Freq: Every day | INTRAVENOUS | Status: AC | PRN
  Administered 2012-05-03: 500 [IU]
  Filled 2012-04-23: qty 5

## 2012-04-23 MED ORDER — SODIUM CHLORIDE 0.9 % IJ SOLN: 3.0000 mL | INTRAMUSCULAR | Status: DC | PRN

## 2012-04-23 MED ORDER — DIPHENHYDRAMINE HCL 25 MG PO CAPS
25.0000 mg | ORAL_CAPSULE | Freq: Four times a day (QID) | ORAL | Status: DC | PRN
  Administered 2012-04-23: 25 mg via ORAL
  Filled 2012-04-23: qty 1

## 2012-04-23 MED ORDER — HEPARIN SOD (PORK) LOCK FLUSH 100 UNIT/ML IV SOLN: 500.0000 [IU] | Freq: Every day | INTRAVENOUS | Status: DC | PRN

## 2012-04-23 MED ORDER — HYDROMORPHONE HCL PF 2 MG/ML IJ SOLN
4.0000 mg | INTRAMUSCULAR | Status: DC | PRN
  Administered 2012-04-23 – 2012-04-28 (×46): 4 mg via INTRAVENOUS
  Filled 2012-04-23 (×9): qty 2
  Filled 2012-04-23: qty 1
  Filled 2012-04-23 (×37): qty 2

## 2012-04-23 MED ORDER — DIPHENHYDRAMINE HCL 50 MG/ML IJ SOLN
25.0000 mg | Freq: Once | INTRAMUSCULAR | Status: AC
  Administered 2012-04-23: 25 mg via INTRAVENOUS
  Filled 2012-04-23: qty 1

## 2012-04-23 MED ORDER — NICOTINE 21 MG/24HR TD PT24
21.0000 mg | MEDICATED_PATCH | Freq: Every day | TRANSDERMAL | Status: DC
  Filled 2012-04-23 (×11): qty 1

## 2012-04-23 MED ORDER — SODIUM CHLORIDE 0.9 % IV SOLN
INTRAVENOUS | Status: AC
  Administered 2012-04-23 – 2012-04-25 (×2): via INTRAVENOUS

## 2012-04-23 MED ORDER — PROMETHAZINE HCL 25 MG PO TABS
25.0000 mg | ORAL_TABLET | Freq: Three times a day (TID) | ORAL | Status: DC | PRN
  Administered 2012-04-23 – 2012-04-24 (×2): 25 mg via ORAL
  Filled 2012-04-23 (×2): qty 1

## 2012-04-23 MED ORDER — ALBUTEROL SULFATE HFA 108 (90 BASE) MCG/ACT IN AERS
2.0000 | INHALATION_SPRAY | Freq: Four times a day (QID) | RESPIRATORY_TRACT | Status: DC | PRN
  Filled 2012-04-23: qty 6.7

## 2012-04-23 MED ORDER — HYDROMORPHONE HCL PF 2 MG/ML IJ SOLN: 4.0000 mg | INTRAMUSCULAR | Status: DC | PRN

## 2012-04-23 MED ORDER — ENOXAPARIN SODIUM 40 MG/0.4ML ~~LOC~~ SOLN
40.0000 mg | SUBCUTANEOUS | Status: DC
  Filled 2012-04-23 (×11): qty 0.4

## 2012-04-23 MED ORDER — FOLIC ACID 1 MG PO TABS
1.0000 mg | ORAL_TABLET | Freq: Every day | ORAL | Status: DC
  Filled 2012-04-23: qty 1

## 2012-04-23 MED ORDER — HYDROXYUREA 500 MG PO CAPS
500.0000 mg | ORAL_CAPSULE | Freq: Three times a day (TID) | ORAL | Status: DC
  Administered 2012-04-24 – 2012-05-03 (×24): 500 mg via ORAL
  Filled 2012-04-23 (×33): qty 1

## 2012-04-23 MED ORDER — FOLIC ACID 1 MG PO TABS
2.0000 mg | ORAL_TABLET | Freq: Every day | ORAL | Status: DC
  Administered 2012-04-24 – 2012-05-03 (×9): 2 mg via ORAL
  Filled 2012-04-23 (×11): qty 2

## 2012-04-23 NOTE — H&P (Signed)
#   [id number] is admit note.  Pete E.

## 2012-04-23 NOTE — Progress Notes (Signed)
PHARMACIST - PHYSICIAN COMMUNICATION CONCERNING: Pharmacy Care Issues Regarding Warfarin Labs  RECOMMENDATION (Action Taken): A baseline and daily protime for three days has been ordered to meet the [facility] Patient safety goal and comply with the current [facility] policy.   The Pharmacy will defer all warfarin dose order changes and follow up of lab results to the prescriber unless an additional order to initiate a "pharmacy Coumadin consult" is placed.  DESCRIPTION:  While hospitalized, to be in compliance with The Joint Commission National Patient Safety Goals, all patients on warfarin must have a baseline and/or current protime prior to the administration of warfarin. Pharmacy has received your order for warfarin without these required laboratory assessments.  Geoffry Paradise, PharmD, BCPS Pager: [phone] 7:30 PM Pharmacy #: [id number]

## 2012-04-24 ENCOUNTER — Inpatient Hospital Stay (HOSPITAL_COMMUNITY): Payer: Medicaid Other

## 2012-04-24 ENCOUNTER — Other Ambulatory Visit: Payer: Medicaid Other | Admitting: Lab

## 2012-04-24 ENCOUNTER — Telehealth: Payer: Self-pay | Admitting: *Deleted

## 2012-04-24 DIAGNOSIS — R161 Splenomegaly, not elsewhere classified: Secondary | ICD-10-CM

## 2012-04-24 DIAGNOSIS — D571 Sickle-cell disease without crisis: Secondary | ICD-10-CM

## 2012-04-24 LAB — IRON AND TIBC
Iron: 174 ug/dL — ABNORMAL HIGH (ref 42–135)
TIBC: 190 ug/dL — ABNORMAL LOW (ref 215–435)
UIBC: 16 ug/dL — ABNORMAL LOW (ref 125–400)

## 2012-04-24 LAB — COMPREHENSIVE METABOLIC PANEL
ALT: 27 U/L (ref 0–53)
AST: 42 U/L — ABNORMAL HIGH (ref 0–37)
Albumin: 3.1 g/dL — ABNORMAL LOW (ref 3.5–5.2)
Alkaline Phosphatase: 85 U/L (ref 39–117)
BUN: 10 mg/dL (ref 6–23)
Chloride: 103 mEq/L (ref 96–112)
Potassium: 3.3 mEq/L — ABNORMAL LOW (ref 3.5–5.1)
Sodium: 136 mEq/L (ref 135–145)
Total Bilirubin: 4.8 mg/dL — ABNORMAL HIGH (ref 0.3–1.2)

## 2012-04-24 MED ORDER — DEXTROSE 5 % IV SOLN
2000.0000 mg | Freq: Three times a day (TID) | INTRAVENOUS | Status: DC
  Administered 2012-04-24 – 2012-05-03 (×24): 2000 mg via INTRAVENOUS
  Filled 2012-04-24 (×30): qty 2

## 2012-04-24 MED ORDER — FUROSEMIDE 10 MG/ML IJ SOLN
20.0000 mg | Freq: Once | INTRAMUSCULAR | Status: AC
  Administered 2012-04-24: 20 mg via INTRAVENOUS
  Filled 2012-04-24: qty 2

## 2012-04-24 MED ORDER — ACETAMINOPHEN 325 MG PO TABS
650.0000 mg | ORAL_TABLET | Freq: Once | ORAL | Status: AC
  Administered 2012-04-24: 650 mg via ORAL
  Filled 2012-04-24: qty 2

## 2012-04-24 MED ORDER — DIPHENHYDRAMINE HCL 50 MG/ML IJ SOLN
25.0000 mg | Freq: Four times a day (QID) | INTRAMUSCULAR | Status: DC | PRN
  Administered 2012-04-24 – 2012-05-03 (×23): 25 mg via INTRAVENOUS
  Filled 2012-04-24 (×24): qty 1

## 2012-04-24 MED ORDER — PROMETHAZINE HCL 25 MG/ML IJ SOLN
25.0000 mg | Freq: Four times a day (QID) | INTRAMUSCULAR | Status: DC | PRN
  Administered 2012-04-24 – 2012-05-03 (×29): 25 mg via INTRAVENOUS
  Filled 2012-04-24 (×30): qty 1

## 2012-04-24 NOTE — Consult Note (Signed)
Reason for Consult:  Splenomegaly Referring Physician:   Dr. Jacquelin Hawking Christopher Warren is an 36 y.o. male.  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  The splenomegaly is causing him increasing pain in the left upper quadrant area. For this sickle cell disease, he is receiving exchange transfusions. The pain would come and go in the past but has not become persistent and somewhat debilitating. We've been asked to see him and evaluate him for a splenectomy. He has received his vaccines.  Past Medical History  Diagnosis Date  . Sickle cell anemia   . Asthma   . 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 Heart murmur     Past Surgical History  Procedure Date  . Cholecystectomy     Family History  Problem Relation Age of Onset  . Sickle cell anemia Mother   . Sickle cell anemia Son     Social History:  reports that he has been smoking Cigarettes.  He has a 2.5 pack-year smoking history. He has never used smokeless tobacco. He reports that he uses illicit drugs about 7 times per week. He reports that he does not drink alcohol.  Allergies:  Allergies  Allergen Reactions  . Morphine And Related Hives    Patient states can take Dilaudid with benadryl  . Adhesive (Tape)   . Latex     Medications: I have reviewed the patient's current medications.  Results for orders placed during the hospital encounter of 04/23/12 (from the past 48 hour(s))  PREPARE RBC (CROSSMATCH)     Status: Normal   Collection Time   04/23/12  4:30 PM      Component Value Range Comment   Order Confirmation ORDER PROCESSED BY BLOOD BANK     CBC WITH DIFFERENTIAL     Status: Abnormal   Collection Time   04/23/12  5:37 PM      Component Value Range Comment   WBC 18.0 (*) 4.0 - 10.5 K/uL    RBC 2.49 (*) 4.22 - 5.81 MIL/uL    Hemoglobin 7.9 (*) 13.0 - 17.0 g/dL    HCT 40.9 (*) 81.1 - 52.0 %    MCV 90.4  78.0 - 100.0 fL    MCH 31.7  26.0 - 34.0 pg    MCHC 35.1  30.0 - 36.0 g/dL    RDW 91.4  (*) 78.2 - 15.5 %    Platelets 256  150 - 400 K/uL    Neutrophils Relative 68  43 - 77 %    Neutro Abs 12.1 (*) 1.7 - 7.7 K/uL    Lymphocytes Relative 22  12 - 46 %    Lymphs Abs 3.9  0.7 - 4.0 K/uL    Monocytes Relative 8  3 - 12 %    Monocytes Absolute 1.5 (*) 0.1 - 1.0 K/uL    Eosinophils Relative 2  0 - 5 %    Eosinophils Absolute 0.4  0.0 - 0.7 K/uL    Basophils Relative 0  0 - 1 %    Basophils Absolute 0.1  0.0 - 0.1 K/uL   RETICULOCYTES     Status: Abnormal   Collection Time   04/23/12  5:37 PM      Component Value Range Comment   Retic Ct Pct 9.7 (*) 0.4 - 3.1 %    RBC. 2.49 (*) 4.22 - 5.81 MIL/uL    Retic Count, Manual 241.5 (*) 19.0 - 186.0 K/uL   CREATININE, SERUM  Status: Normal   Collection Time   04/23/12  5:37 PM      Component Value Range Comment   Creatinine, Ser 0.54  0.50 - 1.35 mg/dL    GFR calc non Af Amer >90  >90 mL/min    GFR calc Af Amer >90  >90 mL/min   FERRITIN     Status: Abnormal   Collection Time   04/23/12  5:37 PM      Component Value Range Comment   Ferritin 4685 (*) 22 - 322 ng/mL   IRON AND TIBC     Status: Abnormal   Collection Time   04/23/12  5:37 PM      Component Value Range Comment   Iron 174 (*) 42 - 135 ug/dL    TIBC 782 (*) 956 - 213 ug/dL    Saturation Ratios 92 (*) 20 - 55 %    UIBC 16 (*) 125 - 400 ug/dL   TYPE AND SCREEN     Status: Normal (Preliminary result)   Collection Time   04/23/12  5:38 PM      Component Value Range Comment   ABO/RH(D) O POS      Antibody Screen NEG      Sample Expiration 04/26/2012      Unit Number [id number]      Blood Component Type RED CELLS,LR      Unit division 00      Status of Unit ALLOCATED      Transfusion Status OK TO TRANSFUSE      Crossmatch Result Compatible      Unit Number [id number]      Blood Component Type RED CELLS,LR      Unit division 00      Status of Unit ALLOCATED      Transfusion Status OK TO TRANSFUSE      Crossmatch Result Compatible     PROTIME-INR      Status: Normal   Collection Time   04/23/12  6:55 PM      Component Value Range Comment   Prothrombin Time 14.8  11.6 - 15.2 seconds    INR 1.18  0.00 - 1.49   COMPREHENSIVE METABOLIC PANEL     Status: Abnormal   Collection Time   04/24/12  7:00 AM      Component Value Range Comment   Sodium 136  135 - 145 mEq/L    Potassium 3.3 (*) 3.5 - 5.1 mEq/L    Chloride 103  96 - 112 mEq/L    CO2 27  19 - 32 mEq/L    Glucose, Bld 104 (*) 70 - 99 mg/dL    BUN 10  6 - 23 mg/dL    Creatinine, Ser 2.53  0.50 - 1.35 mg/dL    Calcium 8.5  8.4 - 66.4 mg/dL    Total Protein 7.0  6.0 - 8.3 g/dL    Albumin 3.1 (*) 3.5 - 5.2 g/dL    AST 42 (*) 0 - 37 U/L    ALT 27  0 - 53 U/L    Alkaline Phosphatase 85  39 - 117 U/L    Total Bilirubin 4.8 (*) 0.3 - 1.2 mg/dL    GFR calc non Af Amer >90  >90 mL/min    GFR calc Af Amer >90  >90 mL/min   PROTIME-INR     Status: Normal   Collection Time   04/24/12  7:00 AM      Component Value Range Comment   Prothrombin Time 14.6  11.6 - 15.2 seconds    INR 1.16  0.00 - 1.49     Dg Chest 2 View  04/24/2012  *RADIOLOGY REPORT*  Clinical Data:  Shortness of breath, sickle cell disease, weakness, left-sided pain, question infiltrate  CHEST - 2 VIEW  Comparison: 01/27/2012  Findings: Right side Port-A-Cath, tip projecting over SVC near cavoatrial junction. Enlargement of cardiac silhouette with pulmonary vascular congestion. Poorly defined area of increased opacity in the lower right lung on the PA view question related to asymmetric chest wall soft tissue or gynecomastia, less well visualized on the previous exam, stable versus an earlier study from 09/30/2010 and not identified on an interval study of 10/03/2011. Minimal chronic peribronchial thickening. No definite acute infiltrate, pleural effusion or pneumothorax. No acute osseous findings. Surgical clips right upper quadrant likely cholecystectomy.  IMPRESSION: Enlargement of cardiac silhouette with pulmonary  vascular congestion consistent with history sickle cell disease. No definite acute abnormalities.   Original Report Authenticated By: Ulyses Southward, M.D.     Review of Systems  Constitutional: Negative for fever and chills.  Gastrointestinal: Positive for abdominal pain.   Blood pressure 105/47, pulse 81, temperature 98.7 F (37.1 C), temperature source Oral, resp. rate 18, height 5\' 5"  (1.651 m), weight 128 lb 4.8 oz (58.196 kg), SpO2 96.00%. Physical Exam  Constitutional: He appears well-developed and well-nourished. No distress.  HENT:  Head: Normocephalic and atraumatic.  Eyes: Scleral icterus is present.  Neck: Neck supple.  Cardiovascular: Normal rate and regular rhythm.   Respiratory: Effort normal and breath sounds normal.  GI: Soft. He exhibits no distension. There is no tenderness.       Palpable spleen in the left subcostal area.  Lymphadenopathy:    He has no cervical adenopathy.  Skin: Skin is warm and dry.       Multiple tattoos are present    Assessment/Plan: Symptomatic splenomegaly in a young man with sickle cell disease. The pain from the splenomegaly has become debilitating.  Plan:  We discussed a splenectomy. I went over the procedure and risks in detail. Risks include but are not limited to bleeding, infection, wound healing problems, anesthesia, susceptibility to certain types of infection, injury to intra-abdominal organs. He seems to understand all this and would like to proceed.  Gregor Dershem J 04/24/2012, 9:43 AM

## 2012-04-24 NOTE — Progress Notes (Signed)
Christopher Warren I will speak to surgery today about getting his spleen removed.  I'll start exchange transfusions on him. I want to dilute out of sickle cell as much as possible prior to surgery.  He is on low-dose warfarin for his Port-A-Cath.  He's had no fever. He's using his incentive spirometer 4 his lungs.  His vital signs are all stable. His lungs are clear bilaterally. Cardiac exam regular rate and rhythm with no murmurs rubs or bruits. Abdominal exam soft. His tenderness in the left upper quadrant. A spleen tip is about 2 cm below the left costal margin. There is no hepatomegaly. Extremities shows no clubbing cyanosis or edema. Neurological exam shows no focal neurological deficits.  Again, will await surgical evaluation. He really needs to have his spleen taken out because of the pain issues and the hemolytic issues that we are encountering.  We will do exchange transfusions starting today. I'll check a CBC on him tomorrow.  Since the hospital does not carry an oral iron chelating agent, we will use IV Desferal.  [name] 14:17

## 2012-04-24 NOTE — H&P (Signed)
NAMEMarland Kitchen  Christopher, Warren NO.:  [id number]  MEDICAL RECORD NO.:  [id number]  LOCATION:  1333                         FACILITY:  [facility]  PHYSICIAN:  [name], M.D.  DATE OF BIRTH:  02-03-1976  DATE OF ADMISSION:  04/23/2012 DATE OF DISCHARGE:                             HISTORY & PHYSICAL   REASON FOR ADMISSION: 1. Splenic infarction. 2. Splenomegaly. 3. Hemoglobin SS disease. 4. 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  HISTORY OF PRESENT ILLNESS:  Christopher Warren is a 36 year old African- American gentleman with hemoglobin SS disease.  He is on Hydrea.  He is on overload.  He has developed lot of abdominal pain over the past couple weeks.  We did do a CT scan on him.  The CT scan was done on October 3.  This did show splenomegaly.  He had areas of infarct.  For a patient with sickle cell disease, splenomegaly is highly unusual. His spleen has caused him a lot of pain.  Despite being exchange transfused, he has not improved with his pain.  He has been requiring pain medication every couple of hours.  He is being admitted so that we can try to get him ready for a splenectomy.  I want to try to dilute out her sickle cell as much as possible in anticipation of surgery.  He has had no fever.  He has had some nausea but no vomiting.  He is very anxious.  He has had some occasional arthralgias.  PAST MEDICAL HISTORY:  Relatively unremarkable.  ALLERGIES: 1. MORPHINE 2. ADHESIVE TAPE.  MEDICATIONS: 1. Exalgo 60 mg p.o. daily. 2. Dilaudid 8 mg p.o. q.4 hours p.r.n. 3. Hydrea 500 mg p.o. t.i.d. 4. Folic acid 1 mg p.o. daily. 5. Exjade 1000 mg p.o. daily. 6. Albuterol inhaler 2 puffs q.6 hours p.r.n. 7. Coumadin 2 mg p.o. daily. 8. Restoril 15 mg p.o. at bedtime. 9. Phenergan 25 mg p.o. q.8 hours p.r.n.  SOCIAL HISTORY:  Remarkable for tobacco use.  There is no alcohol use. He did have past cocaine use.  I do not believe he is doing  this now.  REVIEW OF SYSTEMS:  As stated in history of present illness.  PHYSICAL EXAMINATION:  GENERAL:  This is a fairly well-developed, well- nourished, black gentleman, in mild distress secondary to abdominal pain. VITAL SIGNS:  98.9, pulse 101, respiratory rate 17, blood pressure 106/44. HEENT:  Normocephalic, atraumatic skull.  There is no scleral icterus. He has no oral lesions. NECK:  There is no adenopathy in the neck. LUNGS:  Clear bilaterally. CARDIAC:  Regular rate and rhythm with a normal S1, S2.  He has no murmurs, rubs, or bruits. ABDOMEN:  Soft with good bowel sounds.  He does have tenderness over the left side.  His spleen tip is couple cm below the left costal margin. There is no palpable hepatomegaly. EXTREMITIES:  Shows no clubbing, cyanosis, or edema. SKIN:  No rashes, ecchymosis, or petechiae. NEUROLOGICAL:  Shows no focal neurological deficits.  LABORATORY STUDIES:  White cell count 18, hemoglobin 7.9, hematocrit 22.5, platelet count 256,000.  IMPRESSION:  Christopher Warren is a 36 year old African-American gentleman with hemoglobin SS disease.  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  We are trying to get his iron levels down.  He is on Exjade.  I need to get his spleen out.  Again it is highly unusual for a patient with sickle cell of his age to have a significant spleen.  He has splenomegaly.  The spleen I think is causing sequestration of his red cells.  It is causing pain for him.  I believe that removing the spleen will alleviate a lot of issues.  He did get the triple vaccine in the office last week.  While he is in the hospital, I will do further exchange transfusions to try and dilute down his sickle cell.  I think this will make surgery easier.  We will go ahead and call Surgery.  We will hopefully be able to get them to take his spleen out this week.  I think that this would be highly advantageous for him.  Again, he is taking pain medicine every  2-4 hours.  His spleen is large. His spleen is tender.  Again we will do the exchange transfusions.  We will try that to chelate his iron as much as we can.  We will give him IV fluids.     [name], M.D.     PRE/MEDQ  D:  04/23/2012  T:  04/24/2012  Job:  [id number]

## 2012-04-24 NOTE — Telephone Encounter (Signed)
Received this message from the pt via My Chart. He called yesterday about his hospital room but did not ask to speak with Dr Myna Hidalgo. Christopher "Kendell Bane" stated his ride was lined up and wanted to know when his hospital room was going to be ready at [employer]. He requested that a call be placed to the hospital to check on the status as he has not received a call from admitting. Spoke to admitting, they were close to calling the patient. Admitting stated they would call the pt to let him know that he was assigned to room 1333.  Of note - before ending the conversation with the patient, he confirmed that his ride was ready to take him. Explained to him that he needed to go to the hospital once he received the call. He verbalized understanding. This was approximately at 1100 and he arrived between 1500 and 1600.   ===View-only below this line===  ----- Message -----    From: Christopher Warren    Sent: 04/23/2012   5:27 PM      To: Onc Nurse Hp Subject: Visit Follow-Up Question                       I really don't think it's [employer] that I'm leave'n message after message and I'm not getting Any Responce !! That's Not Bussiness like at all!! I have some questions for my Doctor!! Please he may call me at [phone] ThankYou!!

## 2012-04-25 DIAGNOSIS — R1012 Left upper quadrant pain: Secondary | ICD-10-CM

## 2012-04-25 LAB — COMPREHENSIVE METABOLIC PANEL
AST: 38 U/L — ABNORMAL HIGH (ref 0–37)
BUN: 7 mg/dL (ref 6–23)
CO2: 27 mEq/L (ref 19–32)
Calcium: 8.6 mg/dL (ref 8.4–10.5)
Chloride: 101 mEq/L (ref 96–112)
Creatinine, Ser: 0.55 mg/dL (ref 0.50–1.35)
GFR calc Af Amer: >90 mL/min (ref >90)
GFR calc non Af Amer: >90 mL/min (ref >90)
Glucose, Bld: 94 mg/dL (ref 70–99)
Total Bilirubin: 5.3 mg/dL — ABNORMAL HIGH (ref 0.3–1.2)

## 2012-04-25 LAB — PROTIME-INR
INR: 1.23 (ref 0.00–1.49)
Prothrombin Time: 15.3 seconds — ABNORMAL HIGH (ref 11.6–15.2)

## 2012-04-25 MED ORDER — WARFARIN SODIUM 2 MG PO TABS: 2.0000 mg | ORAL_TABLET | Freq: Every day | ORAL | Status: DC

## 2012-04-25 MED ORDER — POTASSIUM CHLORIDE IN NACL 40-0.9 MEQ/L-% IV SOLN
INTRAVENOUS | Status: DC
  Administered 2012-04-26: via INTRAVENOUS
  Filled 2012-04-25 (×3): qty 1000

## 2012-04-25 NOTE — Progress Notes (Signed)
I have seen and examined the patient and agree with the assessment and plans. Spleen out either tomorrow or Friday pending schedule  Darcell Yacoub A. Magnus Ivan  MD, FACS

## 2012-04-25 NOTE — Progress Notes (Signed)
Still having a good deal of abd pain due to splenomegaly. I greatly appreciate in consultation by Dr. Abbey Chatters. Hopefully splenectomy will be scheduled for Thursday or Friday.  We're doing exchange transfusions in order to minimize his sickle cell for surgery.  He's had no fever. There is no dyspnea.  He had a good birthday yesterday.  His appetite has been okay. There is no nausea vomiting.  There is no lab work as of yet.  His physical exam shows stable vital signs. Temperature is 98.8 pulse 74 respiratory rate 18 blood pressure 108/40. Lungs are clear bilaterally. Cardiac exam regular rate and rhythm with a normal S1-S2. There are no murmurs rubs or bruits. He abdominal exam is soft with good bowel sounds. There is no palpable abdominal mass. There is some tenderness in the left upper quadrant. No change in splenomegaly. Extremities shows no clubbing cyanosis or edema.  Again, we will do another exchange on him today. He is getting Desferal for iron chelation.  We will await the time for surgery.  His Coumadin is only for Port-A-Cath maintenance.  Christopher E.  Romans 5:3-5

## 2012-04-25 NOTE — Progress Notes (Signed)
Subjective: Sedated very sleepy, says he didn't sleep well last pm thinking about things. Complaining about pain in left side.  Also wheezing some.  Objective: Vital signs in last 24 hours: Temp:  [98 F (36.7 C)-99.2 F (37.3 C)] 98.8 F (37.1 C) (11/13 0554) Pulse Rate:  [74-99] 74  (11/13 0554) Resp:  [16-20] 18  (11/13 0554) BP: (103-123)/(40-68) 112/42 mmHg (11/13 0640) SpO2:  [96 %-100 %] 100 % (11/13 0554) Last BM Date: 04/23/12  Afebrile, VSS, CMP, OK, transfused yesterday,  Intake/Output from previous day: 11/12 0701 - 11/13 0700 In: 2575 [P.O.:720; I.V.:1831; Blood:24] Out: 500  Intake/Output this shift:    General appearance: severe distress and sleepy, almost sedated, but no distress Resp: wheezing bilat ABD: soft, complaining of pain on left, no distension, no significant tenderness with palpation. Skin: Skin color, texture, turgor normal. No rashes or lesions or multiple tatoo's  Lab Results:   [facility] 04/23/12 1737  WBC 18.0*  HGB 7.9*  HCT 22.5*  PLT 256    BMET  Basename 04/25/12 0500 04/24/12 0700  NA 134* 136  K 3.5 3.3*  CL 101 103  CO2 27 27  GLUCOSE 94 104*  BUN 7 10  CREATININE 0.55 0.56  CALCIUM 8.6 8.5   PT/INR  Basename 04/25/12 0500 04/24/12 0700  LABPROT 15.3* 14.6  INR 1.23 1.16     Lab 04/25/12 0500 04/24/12 0700  AST 38* 42*  ALT 25 27  ALKPHOS 87 85  BILITOT 5.3* 4.8*  PROT 7.1 7.0  ALBUMIN 3.2* 3.1*     Lipase     Component Value Date/Time   LIPASE 14 03/15/2011 1405     Studies/Results: Dg Chest 2 View  04/24/2012  *RADIOLOGY REPORT*  Clinical Data:  Shortness of breath, sickle cell disease, weakness, left-sided pain, question infiltrate  CHEST - 2 VIEW  Comparison: 01/27/2012  Findings: Right side Port-A-Cath, tip projecting over SVC near cavoatrial junction. Enlargement of cardiac silhouette with pulmonary vascular congestion. Poorly defined area of increased opacity in the lower right lung on the PA  view question related to asymmetric chest wall soft tissue or gynecomastia, less well visualized on the previous exam, stable versus an earlier study from 09/30/2010 and not identified on an interval study of 10/03/2011. Minimal chronic peribronchial thickening. No definite acute infiltrate, pleural effusion or pneumothorax. No acute osseous findings. Surgical clips right upper quadrant likely cholecystectomy.  IMPRESSION: Enlargement of cardiac silhouette with pulmonary vascular congestion consistent with history sickle cell disease. No definite acute abnormalities.   Original Report Authenticated By: Ulyses Southward, M.D.     Medications:    . sodium chloride  250 mL Intravenous Once  . [COMPLETED] acetaminophen  650 mg Oral Once  . deferoxamine (DESFERAL) IV  2,000 mg Intravenous Q8H  . enoxaparin  40 mg Subcutaneous Q24H  . folic acid  2 mg Oral Daily  . [COMPLETED] furosemide  20 mg Intravenous Once  . HYDROmorphone HCl  16 mg Oral Q24H  . hydroxyurea  500 mg Oral TID  . nicotine  21 mg Transdermal Daily  . warfarin  2 mg Oral Daily  . Warfarin - Physician Dosing Inpatient   Does not apply q1800  . [DISCONTINUED] deferasirox  1,000 mg Oral Daily    Assessment/Plan Symptomatic splenomegaly, with chronic pain. Sickle cell anemia  Asthma  Hemoglobin S-S disease  05/10/2011  Heart murmur  S/P cholecystectomy   Plan:  Looking at possible splenectomy tomorrow.  I will be sure he is typed  and screened again, check labs in AM, NPO after MN, begin IS so he can learn it before surgery.  Change IV and rate at 10 pm   LOS: 2 days    Irianna Gilday 04/25/2012

## 2012-04-25 NOTE — Progress Notes (Signed)
Patient ID: Christopher Warren, male   DOB: 11-Sep-1975, 36 y.o.   MRN: [id number]  I had a discussion with the patient regarding splenectomy  I discussed the risks which include but are limited to bleeding, infection, injury to the bowel, injury to other structures, need for further surgery, need for transfusion, etc.  He understands and wishes to proceed. Will schedule for tomorrow.  I do not know if it will be in the morning or afternoon yet.  Dr. Carman Ching

## 2012-04-26 ENCOUNTER — Encounter (HOSPITAL_COMMUNITY)
Admission: AD | Disposition: A | Discharge status: Home / Self Care | Payer: Self-pay | Source: Ambulatory Visit | Attending: Hematology & Oncology

## 2012-04-26 ENCOUNTER — Inpatient Hospital Stay (HOSPITAL_COMMUNITY): Payer: Medicaid Other | Admitting: Anesthesiology

## 2012-04-26 ENCOUNTER — Encounter (HOSPITAL_COMMUNITY): Payer: Self-pay | Admitting: Anesthesiology

## 2012-04-26 HISTORY — PX: SPLENECTOMY, TOTAL: SHX788

## 2012-04-26 LAB — CBC
HCT: 27.3 % — ABNORMAL LOW (ref 39.0–52.0)
Platelets: 250 10*3/uL (ref 150–400)
RBC: 3.12 MIL/uL — ABNORMAL LOW (ref 4.22–5.81)
RDW: 17.4 % — ABNORMAL HIGH (ref 11.5–15.5)
WBC: 15.5 10*3/uL — ABNORMAL HIGH (ref 4.0–10.5)

## 2012-04-26 LAB — TYPE AND SCREEN
ABO/RH(D): O POS
Unit division: 0
Unit division: 0

## 2012-04-26 LAB — COMPREHENSIVE METABOLIC PANEL
Alkaline Phosphatase: 90 U/L (ref 39–117)
BUN: 8 mg/dL (ref 6–23)
Creatinine, Ser: 0.52 mg/dL (ref 0.50–1.35)
GFR calc Af Amer: >90 mL/min (ref >90)
Glucose, Bld: 106 mg/dL — ABNORMAL HIGH (ref 70–99)
Potassium: 3.8 mEq/L (ref 3.5–5.1)
Total Protein: 6.9 g/dL (ref 6.0–8.3)

## 2012-04-26 LAB — PROTIME-INR: Prothrombin Time: 15.6 seconds — ABNORMAL HIGH (ref 11.6–15.2)

## 2012-04-26 SURGERY — SPLENECTOMY
Anesthesia: General | Site: Abdomen | Wound class: Clean Contaminated

## 2012-04-26 MED ORDER — ONDANSETRON HCL 4 MG/2ML IJ SOLN: 4.0000 mg | Freq: Four times a day (QID) | INTRAMUSCULAR | Status: DC | PRN

## 2012-04-26 MED ORDER — ROCURONIUM BROMIDE 100 MG/10ML IV SOLN
INTRAVENOUS | Status: DC | PRN
  Administered 2012-04-26: 20 mg via INTRAVENOUS

## 2012-04-26 MED ORDER — FENTANYL CITRATE 0.05 MG/ML IJ SOLN
INTRAMUSCULAR | Status: DC | PRN
  Administered 2012-04-26: 50 ug via INTRAVENOUS
  Administered 2012-04-26: 100 ug via INTRAVENOUS

## 2012-04-26 MED ORDER — HYDROMORPHONE 0.3 MG/ML IV SOLN
INTRAVENOUS | Status: DC
  Administered 2012-04-26: 0.9 mg via INTRAVENOUS

## 2012-04-26 MED ORDER — BUPIVACAINE HCL (PF) 0.5 % IJ SOLN
INTRAMUSCULAR | Status: DC | PRN
  Administered 2012-04-26: 30 mL

## 2012-04-26 MED ORDER — DIPHENHYDRAMINE HCL 50 MG/ML IJ SOLN: 12.5000 mg | Freq: Four times a day (QID) | INTRAMUSCULAR | Status: DC | PRN

## 2012-04-26 MED ORDER — LIDOCAINE HCL (CARDIAC) 20 MG/ML IV SOLN
INTRAVENOUS | Status: DC | PRN
  Administered 2012-04-26: 50 mg via INTRAVENOUS

## 2012-04-26 MED ORDER — DIPHENHYDRAMINE HCL 12.5 MG/5ML PO ELIX: 12.5000 mg | ORAL_SOLUTION | Freq: Four times a day (QID) | ORAL | Status: DC | PRN

## 2012-04-26 MED ORDER — DEXAMETHASONE SODIUM PHOSPHATE 10 MG/ML IJ SOLN
INTRAMUSCULAR | Status: DC | PRN
  Administered 2012-04-26: 10 mg via INTRAVENOUS

## 2012-04-26 MED ORDER — GLYCOPYRROLATE 0.2 MG/ML IJ SOLN
INTRAMUSCULAR | Status: DC | PRN
  Administered 2012-04-26: 0.4 mg via INTRAVENOUS

## 2012-04-26 MED ORDER — CEFAZOLIN SODIUM-DEXTROSE 2-3 GM-% IV SOLR
INTRAVENOUS | Status: AC
  Filled 2012-04-26: qty 50

## 2012-04-26 MED ORDER — NEOSTIGMINE METHYLSULFATE 1 MG/ML IJ SOLN
INTRAMUSCULAR | Status: DC | PRN
  Administered 2012-04-26: 3 mg via INTRAVENOUS

## 2012-04-26 MED ORDER — FENTANYL CITRATE 0.05 MG/ML IJ SOLN
INTRAMUSCULAR | Status: AC
  Filled 2012-04-26: qty 2

## 2012-04-26 MED ORDER — HYDROMORPHONE 0.3 MG/ML IV SOLN
INTRAVENOUS | Status: AC
  Administered 2012-04-26: 0.3 mg via INTRAVENOUS
  Filled 2012-04-26: qty 25

## 2012-04-26 MED ORDER — SODIUM CHLORIDE 0.9 % IJ SOLN: 9.0000 mL | INTRAMUSCULAR | Status: DC | PRN

## 2012-04-26 MED ORDER — NALOXONE HCL 0.4 MG/ML IJ SOLN: 0.4000 mg | INTRAMUSCULAR | Status: DC | PRN

## 2012-04-26 MED ORDER — LACTATED RINGERS IV SOLN
INTRAVENOUS | Status: DC | PRN
  Administered 2012-04-26: 12:00:00 via INTRAVENOUS

## 2012-04-26 MED ORDER — BUPIVACAINE HCL (PF) 0.5 % IJ SOLN
INTRAMUSCULAR | Status: AC
  Filled 2012-04-26: qty 30

## 2012-04-26 MED ORDER — 0.9 % SODIUM CHLORIDE (POUR BTL) OPTIME
TOPICAL | Status: DC | PRN
  Administered 2012-04-26 (×2): 1000 mL

## 2012-04-26 MED ORDER — FENTANYL CITRATE 0.05 MG/ML IJ SOLN
100.0000 ug | Freq: Once | INTRAMUSCULAR | Status: AC
  Administered 2012-04-26: 100 ug via INTRAVENOUS

## 2012-04-26 MED ORDER — ONDANSETRON HCL 4 MG/2ML IJ SOLN
INTRAMUSCULAR | Status: DC | PRN
  Administered 2012-04-26: 4 mg via INTRAVENOUS

## 2012-04-26 MED ORDER — MEPERIDINE HCL 50 MG/ML IJ SOLN: 6.2500 mg | INTRAMUSCULAR | Status: DC | PRN

## 2012-04-26 MED ORDER — ACETAMINOPHEN 10 MG/ML IV SOLN: 1000.0000 mg | Freq: Once | INTRAVENOUS | Status: DC | PRN

## 2012-04-26 MED ORDER — SUCCINYLCHOLINE CHLORIDE 20 MG/ML IJ SOLN
INTRAMUSCULAR | Status: DC | PRN
  Administered 2012-04-26: 100 mg via INTRAVENOUS

## 2012-04-26 MED ORDER — POTASSIUM CHLORIDE IN NACL 40-0.9 MEQ/L-% IV SOLN
INTRAVENOUS | Status: DC
  Administered 2012-04-26: 17:00:00 via INTRAVENOUS
  Administered 2012-04-27 (×2): 125 mL/h via INTRAVENOUS
  Administered 2012-04-28 – 2012-04-29 (×4): via INTRAVENOUS
  Administered 2012-04-30 – 2012-05-01 (×3): 75 mL/h via INTRAVENOUS
  Administered 2012-05-03: 06:00:00 via INTRAVENOUS
  Filled 2012-04-26 (×18): qty 1000

## 2012-04-26 MED ORDER — MIDAZOLAM HCL 5 MG/5ML IJ SOLN
INTRAMUSCULAR | Status: DC | PRN
  Administered 2012-04-26: 2 mg via INTRAVENOUS

## 2012-04-26 MED ORDER — PROPOFOL 10 MG/ML IV BOLUS
INTRAVENOUS | Status: DC | PRN
  Administered 2012-04-26: 200 mg via INTRAVENOUS

## 2012-04-26 MED ORDER — HYDROMORPHONE HCL PF 1 MG/ML IJ SOLN: 0.2500 mg | INTRAMUSCULAR | Status: DC | PRN

## 2012-04-26 MED ORDER — CEFAZOLIN SODIUM-DEXTROSE 2-3 GM-% IV SOLR
INTRAVENOUS | Status: DC | PRN
  Administered 2012-04-26: 2 g via INTRAVENOUS

## 2012-04-26 MED ORDER — PROMETHAZINE HCL 25 MG/ML IJ SOLN: 6.2500 mg | INTRAMUSCULAR | Status: DC | PRN

## 2012-04-26 SURGICAL SUPPLY — 57 items
BLADE EXTENDED COATED 6.5IN (ELECTRODE) ×2 IMPLANT
BLADE HEX COATED 2.75 (ELECTRODE) ×2 IMPLANT
CANISTER SUCTION 2500CC (MISCELLANEOUS) ×2 IMPLANT
CHLORAPREP W/TINT 26ML (MISCELLANEOUS) ×2 IMPLANT
CLIP TI LARGE 6 (CLIP) IMPLANT
CLOTH BEACON ORANGE TIMEOUT ST (SAFETY) ×2 IMPLANT
COVER MAYO STAND STRL (DRAPES) ×2 IMPLANT
COVER SURGICAL LIGHT HANDLE (MISCELLANEOUS) ×2 IMPLANT
DRAPE LAPAROSCOPIC ABDOMINAL (DRAPES) ×2 IMPLANT
DRAPE LG THREE QUARTER DISP (DRAPES) IMPLANT
DRAPE UTILITY XL STRL (DRAPES) ×2 IMPLANT
DRAPE WARM FLUID 44X44 (DRAPE) ×2 IMPLANT
ELECT REM PT RETURN 9FT ADLT (ELECTROSURGICAL) ×2
ELECTRODE REM PT RTRN 9FT ADLT (ELECTROSURGICAL) ×1 IMPLANT
GLOVE BIOGEL PI IND STRL 6.5 (GLOVE) ×2 IMPLANT
GLOVE BIOGEL PI IND STRL 7.0 (GLOVE) IMPLANT
GLOVE BIOGEL PI INDICATOR 6.5 (GLOVE) ×2
GLOVE BIOGEL PI INDICATOR 7.0 (GLOVE)
GLOVE SURG SIGNA 7.5 PF LTX (GLOVE) ×2 IMPLANT
GLOVE SURG SS PI 6.5 STRL IVOR (GLOVE) ×4 IMPLANT
GOWN PREVENTION PLUS LG XLONG (DISPOSABLE) ×4 IMPLANT
GOWN PREVENTION PLUS XLARGE (GOWN DISPOSABLE) IMPLANT
GOWN STRL NON-REIN LRG LVL3 (GOWN DISPOSABLE) ×2 IMPLANT
GOWN STRL REIN XL XLG (GOWN DISPOSABLE) IMPLANT
HEMOSTAT SNOW SURGICEL 2X4 (HEMOSTASIS) ×4 IMPLANT
KIT BASIN OR (CUSTOM PROCEDURE TRAY) ×2 IMPLANT
LIGASURE IMPACT 36 18CM CVD LR (INSTRUMENTS) IMPLANT
NEEDLE HYPO 22GX1.5 SAFETY (NEEDLE) ×2 IMPLANT
NS IRRIG 1000ML POUR BTL (IV SOLUTION) ×4 IMPLANT
PACK GENERAL/GYN (CUSTOM PROCEDURE TRAY) ×2 IMPLANT
SCALPEL HARMONIC ACE (MISCELLANEOUS) IMPLANT
SHEARS FOC LG CVD HARMONIC 17C (MISCELLANEOUS) IMPLANT
SPONGE GAUZE 4X4 12PLY (GAUZE/BANDAGES/DRESSINGS) ×4 IMPLANT
SPONGE LAP 18X18 X RAY DECT (DISPOSABLE) ×4 IMPLANT
STAPLER VISISTAT 35W (STAPLE) ×2 IMPLANT
SUCTION POOLE TIP (SUCTIONS) ×2 IMPLANT
SUT PDS AB 1 CTX 36 (SUTURE) IMPLANT
SUT PDS AB 1 TP1 96 (SUTURE) ×4 IMPLANT
SUT PDS AB 3-0 CT2 27 (SUTURE) IMPLANT
SUT PDS AB 3-0 SH 27 (SUTURE) IMPLANT
SUT PDS AB 4-0 SH 27 (SUTURE) IMPLANT
SUT PROLENE 2 0 BLUE (SUTURE) IMPLANT
SUT SILK 2 0 (SUTURE)
SUT SILK 2 0 SH CR/8 (SUTURE) IMPLANT
SUT SILK 2 0SH CR/8 30 (SUTURE) ×2 IMPLANT
SUT SILK 2-0 18XBRD TIE 12 (SUTURE) IMPLANT
SUT SILK 2-0 30XBRD TIE 12 (SUTURE) IMPLANT
SUT SILK 3 0 (SUTURE)
SUT SILK 3 0 SH CR/8 (SUTURE) IMPLANT
SUT SILK 3-0 18XBRD TIE 12 (SUTURE) IMPLANT
SUT VIC AB 2-0 SH 27 (SUTURE) ×1
SUT VIC AB 2-0 SH 27X BRD (SUTURE) ×1 IMPLANT
SYR CONTROL 10ML LL (SYRINGE) ×2 IMPLANT
TOWEL OR 17X26 10 PK STRL BLUE (TOWEL DISPOSABLE) ×2 IMPLANT
TOWEL OR NON WOVEN STRL DISP B (DISPOSABLE) ×2 IMPLANT
TRAY FOLEY CATH 14FRSI W/METER (CATHETERS) ×2 IMPLANT
YANKAUER SUCT BULB TIP NO VENT (SUCTIONS) ×2 IMPLANT

## 2012-04-26 NOTE — Anesthesia Postprocedure Evaluation (Signed)
Anesthesia Post Note  Patient: Christopher Warren  Procedure(s) Performed: Procedure(s) (LRB): SPLENECTOMY (N/A)  Anesthesia type: General  Patient location: PACU  Post pain: Pain level controlled  Post assessment: Post-op Vital signs reviewed  Last Vitals: BP 132/65  Pulse 81  Temp 36.9 C (Oral)  Resp 12  Ht 5\' 5"  (1.651 m)  Wt 128 lb 8.5 oz (58.3 kg)  BMI 21.39 kg/m2  SpO2 95%  Post vital signs: Reviewed  Level of consciousness: sedated  Complications: No apparent anesthesia complications

## 2012-04-26 NOTE — Op Note (Signed)
SPLENECTOMY  Procedure Note  Christopher Warren 04/23/2012 - 04/26/2012   Pre-op Diagnosis: splenomegaly     Post-op Diagnosis: same  Procedure(s): SPLENECTOMY  Surgeon(s): Shelly Rubenstein, MD  Anesthesia: General  Staff:  Gerda Diss, RN - Scrub Person Tammy Ellport, [place] - Scrub Person Debracca Foster Simpson, RN - Circulator Arnette Norris, RN - Circulator Assistant  Estimated Blood Loss: Minimal               Specimens: spleen         Indications: This is a 35 year old gentleman with sickle cell disease who presents for splenectomy.  He has splenomegaly and has required multiple transfusions.  Findings: The patient was found to have a very large spleen with a large amount of lesions from the spleen to the diaphragm and abdominal sidewall  Procedure: The patient was brought to the operating room and identified as the correct patient. He was placed supine on the operating room table and general anesthesia was induced. His abdomen was then prepped and draped in the usual sterile fashion. I made a subcostal incision on the left side with a scalpel I took this down to the fascial layers and muscle with electrocautery. The peritoneum wasn't opened entirely through the incision. The spleen was easily identified and found to be quite large. There were dense adhesions from the capsule of the spleen to the diaphragm and abdominal sidewall. I did take these down with electrocautery. Is very difficult to take these adhesions down into to the backside of the spleen. I took down a lower pole vessel with clamps and silk ties. I then identified 2 short gastric vessels which I took down with clamps and silk ties as well. I was then able to elevate the spleen finally up into the incision. I took down the hilar vessels with several clamps and a completely transected these and was able to completely remove the spleen. The spleen was sent to pathology for evaluation. I tied off the  hilar vessels with 2-0 silk suture ligatures. I then thoroughly irrigated the left upper quadrant with normal saline. There was several areas oozing from the surface of the diaphragm and abdominal sidewall. I controlled these with the cautery as well as 2 pieces of surgical snow.  Hemostasis appeared achieved. I closed the posterior fascia and peritoneum with a 2-0 Vicryl suture. I then closed the anterior fascia with a running #1 looped PDS suture. I irrigated with saline. I anesthetized the circumference with Marcaine. I then closed the skin with staples. The patient tolerated the procedure well. All counts were correct at the end of the procedure. The patient was then extubated in the operating room and taken in stable condition to the recovery room. [name] A   Date: 04/26/2012  Time: 1:28 PM

## 2012-04-26 NOTE — Progress Notes (Signed)
Patient complaining of pain.  Patient adamant that he gets his iv pain medicine and not the pca medicine.  Dr. Myna Hidalgo notified.  Orders for pca discontinued.  Philomena Doheny RN

## 2012-04-26 NOTE — Transfer of Care (Signed)
Immediate Anesthesia Transfer of Care Note  Patient: Christopher Warren  Procedure(s) Performed: Procedure(s) (LRB) with comments: SPLENECTOMY (N/A)  Patient Location: PACU  Anesthesia Type:General  Level of Consciousness: sedated  Airway & Oxygen Therapy: Patient Spontanous Breathing and Patient connected to face mask oxygen  Post-op Assessment: Report given to PACU RN and Post -op Vital signs reviewed and stable  Post vital signs: Reviewed and stable  Complications: No apparent anesthesia complications

## 2012-04-26 NOTE — Progress Notes (Signed)
Surgery is set for today. I greatly appreciate Dr. Magnus Ivan for doing this.  [name] hemoglobin of 9.7. This is a very nice for him. Again, I think we done a good job of cleaning out his cells. His reticulocyte count is down to 10%, which also is a for [name].  He is afebrile. He's not having any cough shortness of breath. He's been using his incentive spirometer.  His vital signs are stable. Blood pressure is 129/61. Pulse is 91. His lungs are clear bilaterally. Cardiac exam regular rate and rhythm with a normal S1-S2. There are no murmurs rubs or bruits. Abdominal exam soft. There is tenderness in the left upper quadrant. No change in sputum up. Extremities shows no clubbing cyanosis or edema.  Await the results from his surgery. 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  Again, I greatly appreciate Dr. Magnus Ivan been able to do the surgery today.  Pete E.  Hebrews 12:12

## 2012-04-26 NOTE — Anesthesia Preprocedure Evaluation (Addendum)
Anesthesia Evaluation  Patient identified by MRN, date of birth, ID band Patient awake    Reviewed: Allergy & Precautions, H&P , NPO status , Patient's Chart, lab work & pertinent test results  Airway Mallampati: II TM Distance: >3 FB Neck ROM: Full    Dental  (+) Dental Advisory Given and Teeth Intact   Pulmonary asthma , Current Smoker,  breath sounds clear to auscultation  Pulmonary exam normal       Cardiovascular - angina- CAD and - Past MI + Valvular Problems/Murmurs Rhythm:Regular Rate:Tachycardia     Neuro/Psych negative neurological ROS  negative psych ROS   GI/Hepatic negative GI ROS, Neg liver ROS,   Endo/Other  negative endocrine ROS  Renal/GU negative Renal ROS     Musculoskeletal negative musculoskeletal ROS (+)   Abdominal   Peds  Hematology  (+) Blood dyscrasia, anemia ,   Anesthesia Other Findings   Reproductive/Obstetrics                          Anesthesia Physical Anesthesia Plan  ASA: III  Anesthesia Plan: General   Post-op Pain Management:    Induction: Intravenous  Airway Management Planned: Oral ETT  Additional Equipment:   Intra-op Plan:   Post-operative Plan: Extubation in OR  Informed Consent: I have reviewed the patients History and Physical, chart, labs and discussed the procedure including the risks, benefits and alternatives for the proposed anesthesia with the patient or authorized representative who has indicated his/her understanding and acceptance.   Dental advisory given  Plan Discussed with: CRNA  Anesthesia Plan Comments: (2 x PIV, possible invasive BP monitoring)        Anesthesia Quick Evaluation

## 2012-04-27 ENCOUNTER — Encounter (HOSPITAL_COMMUNITY): Payer: Self-pay | Admitting: Surgery

## 2012-04-27 DIAGNOSIS — Z9089 Acquired absence of other organs: Secondary | ICD-10-CM

## 2012-04-27 LAB — CBC
HCT: 27.7 % — ABNORMAL LOW (ref 39.0–52.0)
Hemoglobin: 9.4 g/dL — ABNORMAL LOW (ref 13.0–17.0)
MCHC: 33.9 g/dL (ref 30.0–36.0)
WBC: 17.7 10*3/uL — ABNORMAL HIGH (ref 4.0–10.5)

## 2012-04-27 LAB — COMPREHENSIVE METABOLIC PANEL
ALT: 23 U/L (ref 0–53)
Albumin: 3 g/dL — ABNORMAL LOW (ref 3.5–5.2)
Calcium: 9 mg/dL (ref 8.4–10.5)
GFR calc Af Amer: >90 mL/min (ref >90)
Glucose, Bld: 114 mg/dL — ABNORMAL HIGH (ref 70–99)
Sodium: 132 mEq/L — ABNORMAL LOW (ref 135–145)
Total Protein: 6.8 g/dL (ref 6.0–8.3)

## 2012-04-27 NOTE — Progress Notes (Signed)
1 Day Post-Op  Subjective: Somnulent but arousable, has been c/o of pain issues, at present appears comfortable. "wants something to eat" minimal BS though no flatus.  Objective: Vital signs in last 24 hours: Temp:  [97.2 F (36.2 C)-99.2 F (37.3 C)] 98.7 F (37.1 C) (11/15 0705) Pulse Rate:  [75-90] 90  (11/15 0705) Resp:  [12-20] 20  (11/15 0705) BP: (106-142)/(48-73) 111/52 mmHg (11/15 0705) SpO2:  [94 %-98 %] 95 % (11/15 0705) Last BM Date: 04/25/12  Intake/Output from previous day: 11/14 0701 - 11/15 0700 In: 800 [I.V.:800] Out: 975 [Urine:875; Blood:100] Intake/Output this shift: Total I/O In: -  Out: 600 [Urine:600]  General appearance: cooperative, appears stated age and no distress, somnolent but easily arousable Chest: Coarse sounding BS bilaterally No cough. Cardiac: RRR No M/R/G Abdomen: dressing is c/d/i , soft, minimal BS, No distention, + c/o earlier of nausea. Labs: WBC has trended up slightly, H&H plt's appear stable, mild hyponatremia (asymptomatic), BUN and Creat improving. INR 1.27 VSS,febrile now had earlier Tmax of 99.2.  Lab Results:   [facility] 04/27/12 0450 04/26/12 0403  WBC 17.7* 15.5*  HGB 9.4* 9.7*  HCT 27.7* 27.3*  PLT 307 250   BMET  Basename 04/27/12 0450 04/26/12 0403  NA 132* 139  K 4.0 3.8  CL 99 104  CO2 27 28  GLUCOSE 114* 106*  BUN 6 8  CREATININE 0.47* 0.52  CALCIUM 9.0 8.6   PT/INR  Basename 04/26/12 0403 04/25/12 0500  LABPROT 15.6* 15.3*  INR 1.27 1.23   ABG No results found for this basename: PHART:2,PCO2:2,PO2:2,HCO3:2 in the last 72 hours  Studies/Results: No results found.  Anti-infectives: Anti-infectives    None      Assessment/Plan: Patient Active Problem List  Diagnosis  . Hemoglobin S-S disease  . Sickle cell pain crisis  . Leukocytosis  . Hyperbilirubinemia  . Fever  . Iron overload  . Sickle cell crisis   s/p Procedure(s) (LRB) with comments: SPLENECTOMY (N/A) Hematology's note is  seen and appreciated. Agree with need for aggressive pulmonary toilet, ambulation Will continue NPO for now (will discuss with Dr. Magnus Ivan when to begin feeds) Continue with management per hematology and we will follow his clinical course with you.   LOS: 4 days    Golda Acre [facility] Surgery Pager # [phone]  04/27/12

## 2012-04-27 NOTE — Progress Notes (Signed)
Having significant postop pain. His surgery went well. His spleen was quite large according to the report. I did do thank Dr. Magnus Ivan so much for his great surgical expertise.  Not surprisingly, he is complaining of a lot of pain. I told him that this is natural and that this will get better relatively quickly. We will not change his pain medication schedule.  I told that he really needs to get up and move about a little bit. This will help with his pain and also with his lungs. I told him to use the incentive parameter every hour. Her graft quite pleased that his hemoglobin is holding steady at 9.4. His total bilirubin is 3.2. This, to me, indicates that his hemolysis is not too brisk.  His vital signs are all stable. She's afebrile. Blood pressure 111/52. His lungs are with some decreased breath sounds in the bases. Cardiac exam regular rate and rhythm with a normal S1-S2. There are no murmurs rubs or bruits. Abdominal exam shows a dressing over his left side. There is no abdominal distention. His bowel sounds are somewhat decreased. There is no hepatomegaly. Extremities shows no clubbing cyanosis or edema.  We now just need to get him through his postoperative period. He should be, more active in the next day or so. We will need to watch his hemoglobin daily. We hold on exchange transfusions for now. Incentive spirometry use is critical.  Again, I really thank Dr. Magnus Ivan for his quick intervention and doing the splenectomy yesterday.  Lockie Mola 119:15

## 2012-04-27 NOTE — Progress Notes (Signed)
I have seen and examined the patient and agree with the assessment and plans. Will allow sips.  Mohamadou Maciver A. Magnus Ivan  MD, FACS

## 2012-04-28 LAB — COMPREHENSIVE METABOLIC PANEL
Alkaline Phosphatase: 84 U/L (ref 39–117)
BUN: 6 mg/dL (ref 6–23)
Chloride: 99 mEq/L (ref 96–112)
GFR calc Af Amer: >90 mL/min (ref >90)
Glucose, Bld: 111 mg/dL — ABNORMAL HIGH (ref 70–99)
Potassium: 3.4 mEq/L — ABNORMAL LOW (ref 3.5–5.1)
Total Bilirubin: 2.7 mg/dL — ABNORMAL HIGH (ref 0.3–1.2)

## 2012-04-28 MED ORDER — MAGNESIUM HYDROXIDE 400 MG/5ML PO SUSP
30.0000 mL | Freq: Every day | ORAL | Status: DC | PRN
  Administered 2012-04-28 – 2012-05-02 (×5): 30 mL via ORAL
  Filled 2012-04-28 (×4): qty 30

## 2012-04-28 MED ORDER — HYDROMORPHONE HCL PF 4 MG/ML IJ SOLN
6.0000 mg | INTRAMUSCULAR | Status: DC | PRN
  Administered 2012-04-28 (×6): 6 mg via INTRAVENOUS
  Filled 2012-04-28 (×7): qty 2

## 2012-04-28 MED ORDER — HYDROMORPHONE HCL PF 2 MG/ML IJ SOLN
6.0000 mg | INTRAMUSCULAR | Status: DC | PRN
  Administered 2012-04-28 – 2012-05-02 (×37): 6 mg via INTRAVENOUS
  Filled 2012-04-28 (×37): qty 3

## 2012-04-28 NOTE — Progress Notes (Signed)
2 Days Post-Op  Subjective: Still c/o LUQ incisional pain.  Asking for food  Objective: Vital signs in last 24 hours: Temp:  [99.2 F (37.3 C)-99.3 F (37.4 C)] 99.3 F (37.4 C) (11/16 0641) Pulse Rate:  [87-93] 93  (11/16 0641) Resp:  [16] 16  (11/16 0641) BP: (113-123)/(45-72) 113/45 mmHg (11/16 0641) SpO2:  [94 %-99 %] 99 % (11/16 0641) Last BM Date: 04/25/12  Intake/Output from previous day: 11/15 0701 - 11/16 0700 In: 2250 [I.V.:1250; IV Piggyback:1000] Out: 2250 [Urine:2250] Intake/Output this shift:    General appearance: alert and no distress GI: soft, appropriate incisional tenderness, ND, no right sided tenderness or peritoneal signs, no sign of infection  Lab Results:   [facility] 04/27/12 0450 04/26/12 0403  WBC 17.7* 15.5*  HGB 9.4* 9.7*  HCT 27.7* 27.3*  PLT 307 250   BMET  Basename 04/28/12 0515 04/27/12 0450  NA 133* 132*  K 3.4* 4.0  CL 99 99  CO2 28 27  GLUCOSE 111* 114*  BUN 6 6  CREATININE 0.54 0.47*  CALCIUM 9.2 9.0   PT/INR  Basename 04/26/12 0403  LABPROT 15.6*  INR 1.27   ABG No results found for this basename: PHART:2,PCO2:2,PO2:2,HCO3:2 in the last 72 hours  Studies/Results: No results found.  Anti-infectives: Anti-infectives    None      Assessment/Plan: s/p Procedure(s) (LRB) with comments: SPLENECTOMY (N/A) Advance diet mobilize, pain control, advance diet  LOS: 5 days    Lodema Pilot DAVID 04/28/2012

## 2012-04-28 NOTE — Progress Notes (Signed)
Christopher Warren is progressing slowly post-op.  Still with a lot of postoperative pain in the left upper quadrant. I told him that this would get better. He is still on clear liquids. Surgery is in charge of advancing his diet.  Is no nausea vomiting. He's had no fever.  His hemolysis has to be improving as his total bilirubin keeps coming down. It is now 2.7.  He is sitting up more. He is getting out of bed a little bit more.  He is using his incentive spirometer. I encouraged him to do this every hour. He is on Lovenox.  His vital signs are stable. Temperature 99.3. Pulse 90 respiratory rate 16 blood pressure 113/45. Lungs are clear bilaterally. Cardiac exam regular in rhythm with no murmurs rubs or bruits. Abdominal exam does show decreased bowel sounds but they are present. There is no abdominal distention. Has a dressing over his splenectomy incision. Extremities shows no clubbing cyanosis or edema.  We will continue to support him. Hopefully, we will be able to get him home in a week. We just need to continue to manage his pain and increase his activity level.  I did have a good prayer session with him. He does have a strong faith.   Pete E.  Hebrews 12:12

## 2012-04-29 DIAGNOSIS — D5700 Hb-SS disease with crisis, unspecified: Secondary | ICD-10-CM

## 2012-04-29 LAB — COMPREHENSIVE METABOLIC PANEL
ALT: 23 U/L (ref 0–53)
AST: 46 U/L — ABNORMAL HIGH (ref 0–37)
Calcium: 8.9 mg/dL (ref 8.4–10.5)
Sodium: 135 mEq/L (ref 135–145)
Total Protein: 7.3 g/dL (ref 6.0–8.3)

## 2012-04-29 LAB — CBC
HCT: 28.5 % — ABNORMAL LOW (ref 39.0–52.0)
Hemoglobin: 9.5 g/dL — ABNORMAL LOW (ref 13.0–17.0)
MCH: 30.1 pg (ref 26.0–34.0)
MCHC: 33.3 g/dL (ref 30.0–36.0)

## 2012-04-29 NOTE — Progress Notes (Signed)
3 Days Post-Op  Subjective: He again complains of pain.  He says that he has abdominal pain but hurts all over from his sickle cell as well.  No BM or flatus.  He has been eating crackers and cheese and biscuits despite being on full liquids.  He denies any nausea or vomiting.  Objective: Vital signs in last 24 hours: Temp:  [98.5 F (36.9 C)-99.7 F (37.6 C)] 98.5 F (36.9 C) (11/17 0540) Pulse Rate:  [69-94] 94  (11/17 0540) Resp:  [12-18] 12  (11/17 0540) BP: (107-123)/(49-68) 123/68 mmHg (11/17 0540) SpO2:  [92 %-97 %] 94 % (11/17 0540) Last BM Date: 04/25/12  Intake/Output from previous day: 11/16 0701 - 11/17 0700 In: 2521.3 [P.O.:640; I.V.:1881.3] Out: 900 [Urine:900] Intake/Output this shift:    General appearance: alert, cooperative and no distress GI: soft, appropriate incisional tenderness, some slight tympany, incision without infection, no peritoneal signs.  Lab Results:   [facility] 04/29/12 0624 04/27/12 0450  WBC 13.3* 17.7*  HGB 9.5* 9.4*  HCT 28.5* 27.7*  PLT 491* 307   BMET  Basename 04/29/12 0624 04/28/12 0515  NA 135 133*  K 4.0 3.4*  CL 101 99  CO2 28 28  GLUCOSE 104* 111*  BUN 8 6  CREATININE 0.58 0.54  CALCIUM 8.9 9.2   PT/INR No results found for this basename: LABPROT:2,INR:2 in the last 72 hours ABG No results found for this basename: PHART:2,PCO2:2,PO2:2,HCO3:2 in the last 72 hours  Studies/Results: No results found.  Anti-infectives: Anti-infectives    None      Assessment/Plan: s/p Procedure(s) (LRB) with comments: SPLENECTOMY (N/A) He has not shown much sign of bowel function but has not been having nausea or vomiting.  I would probably not advance the diet since he seems slightly distended and he is already eating regular foods.  Continue to mobilize.  No new recommendations.  LOS: 6 days    Christopher Warren 04/29/2012

## 2012-04-29 NOTE — Progress Notes (Signed)
IP PROGRESS NOTE  Subjective:   Patient is doing better. Pain is improving. Keeping food down without vomiting.  Objective:  Vital signs in last 24 hours: Temp:  [98.5 F (36.9 C)-99.7 F (37.6 C)] 98.5 F (36.9 C) (11/17 0540) Pulse Rate:  [69-94] 94  (11/17 0540) Resp:  [12-18] 12  (11/17 0540) BP: (107-123)/(49-68) 123/68 mmHg (11/17 0540) SpO2:  [92 %-97 %] 94 % (11/17 0540) Weight change:  Last BM Date: 04/25/12  Intake/Output from previous day: 11/16 0701 - 11/17 0700 In: 2521.3 [P.O.:640; I.V.:1881.3] Out: 900 [Urine:900]  Mouth: mucous membranes moist, pharynx normal without lesions Resp: clear to auscultation bilaterally Cardio: regular rate and rhythm, S1, S2 normal, no murmur, click, rub or gallop GI: soft, non-tender; bowel sounds normal; no masses,  no organomegaly Extremities: extremities normal, atraumatic, no cyanosis or edema  Portacath/PICC-without erythema  Lab Results:  [facility] 04/29/12 0624 04/27/12 0450  WBC 13.3* 17.7*  HGB 9.5* 9.4*  HCT 28.5* 27.7*  PLT 491* 307    BMET  Basename 04/29/12 0624 04/28/12 0515  NA 135 133*  K 4.0 3.4*  CL 101 99  CO2 28 28  GLUCOSE 104* 111*  BUN 8 6  CREATININE 0.58 0.54  CALCIUM 8.9 9.2    Medications: I have reviewed the patient's current medications.  Assessment/Plan:  36 year old with:   1. Splenic infarction.  2. Splenomegaly. 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 He recovering slowly.  3. Hemoglobin SS disease.  4. 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 Improving at this time.     LOS: 6 days   [facility] 04/29/2012, 7:37 AM

## 2012-04-30 LAB — COMPREHENSIVE METABOLIC PANEL
Alkaline Phosphatase: 89 U/L (ref 39–117)
BUN: 6 mg/dL (ref 6–23)
GFR calc Af Amer: >90 mL/min (ref >90)
Glucose, Bld: 106 mg/dL — ABNORMAL HIGH (ref 70–99)
Potassium: 4.1 mEq/L (ref 3.5–5.1)
Total Protein: 7.7 g/dL (ref 6.0–8.3)

## 2012-04-30 LAB — CBC
HCT: 27.9 % — ABNORMAL LOW (ref 39.0–52.0)
Platelets: 593 10*3/uL — ABNORMAL HIGH (ref 150–400)
RDW: 17 % — ABNORMAL HIGH (ref 11.5–15.5)
WBC: 12.7 10*3/uL — ABNORMAL HIGH (ref 4.0–10.5)

## 2012-04-30 MED ORDER — VITAMINS A & D EX OINT
TOPICAL_OINTMENT | CUTANEOUS | Status: AC
  Administered 2012-04-30: 5
  Filled 2012-04-30: qty 5

## 2012-04-30 NOTE — Progress Notes (Signed)
Having some sickle pain, but no nausea More bowel movements today Abdominal wall soreness  On regular diet  Discharge per Dr. Marletta Lor. Corliss Skains, MD, [facility] Surgery  04/30/2012 12:42 PM

## 2012-04-30 NOTE — Progress Notes (Signed)
Mr. Albor continues to make progress. He did have a bowel movement yesterday. He really wants try to eat regular food. I told him that the surgeons will decide this but I suspect that they will allow him to advance his diet.  The pain is not as bad as it was when I saw him 2 days ago. He is getting up about better. He seems more at ease.  There is no cough or shortness of breath. He is using his incentive spirometer.  We're still using the Desferal to help with iron chelation. I will continue him on this as long as he is in the hospital. This way I know that he is on something to help decrease his iron burden.  On his physical exam his vital signs are all stable. Blood pressure 123/67. He is afebrile with a temperature 98.5. His lungs are clear bilaterally. Cardiac exam regular rate and rhythm with a normal S1-S2. There are no murmurs rubs or bruits. Abdominal exam is soft. There maybe some slight distention. He does have some bowel sounds. These are slightly decreased but present. There is tenderness throughout the abdomen. Extremities shows no clubbing cyanosis or edema.  On his labs his hemoglobin is 9.4 and holding steady. I think this is probably the most favorable sign as far as sickle crisis and hemolysis.  I suspect that we likely will be able to discharge him in 2 or 3 days. I think once he gets on a regular diet, then we can see about discharge.  I really, really, really thank the surgeons for their rapid response and operating on him last week.  [name] 17:14

## 2012-04-30 NOTE — Progress Notes (Signed)
Patient ID: Christopher Warren, male   DOB: March 30, 1976, 36 y.o.   MRN: [id number] 4 Days Post-Op  Subjective: Lots of complaints about abd pain with all activities but up walking in room and eating cheese and crackers, wants to be on a regular diet now, +bm last night  Objective: Vital signs in last 24 hours: Temp:  [98.4 F (36.9 C)-99.4 F (37.4 C)] 98.5 F (36.9 C) (11/18 0545) Pulse Rate:  [91-114] 99  (11/18 0545) Resp:  [15-20] 18  (11/18 0545) BP: (111-127)/(56-73) 123/67 mmHg (11/18 0545) SpO2:  [94 %-97 %] 96 % (11/18 0545) Last BM Date: 04/29/12  Intake/Output from previous day: 11/17 0701 - 11/18 0700 In: 700 [P.O.:700] Out: 175 [Urine:175] Intake/Output this shift:    General appearance: alert, cooperative and no distress GI: relatively soft, +BS, incision c/d/i, tender along incision  Lab Results:   [facility] 04/30/12 0620 04/29/12 0624  WBC 12.7* 13.3*  HGB 9.4* 9.5*  HCT 27.9* 28.5*  PLT 593* 491*   BMET  Basename 04/30/12 0620 04/29/12 0624  NA 133* 135  K 4.1 4.0  CL 99 101  CO2 28 28  GLUCOSE 106* 104*  BUN 6 8  CREATININE 0.59 0.58  CALCIUM 9.0 8.9   PT/INR No results found for this basename: LABPROT:2,INR:2 in the last 72 hours ABG No results found for this basename: PHART:2,PCO2:2,PO2:2,HCO3:2 in the last 72 hours  Studies/Results: No results found.  Anti-infectives: Anti-infectives    None      Assessment/Plan: 1. POD#4- Splenectomy: +BM now, has already been eating mostly regular foods despite being on liquid diet, with BM can officially advance his diet, d/c IVFs, if officially tolerates regular diet then should be ready for d/c from surgical standpoint.  --advance diet  --reduce IVFs if ok with medicine.  --if d/c'd today would need staples out in 6-7 days.  LOS: 7 days    Lurlean Kernen 04/30/2012

## 2012-05-01 ENCOUNTER — Inpatient Hospital Stay (HOSPITAL_COMMUNITY): Payer: Medicaid Other

## 2012-05-01 LAB — CBC
Hemoglobin: 9.2 g/dL — ABNORMAL LOW (ref 13.0–17.0)
MCH: 30.8 pg (ref 26.0–34.0)
RBC: 2.99 MIL/uL — ABNORMAL LOW (ref 4.22–5.81)
WBC: 10.1 10*3/uL (ref 4.0–10.5)

## 2012-05-01 NOTE — Progress Notes (Signed)
Patient ID: Christopher Warren, male   DOB: 06-May-1976, 36 y.o.   MRN: [id number] 5 Days Post-Op  Subjective: Still with lots of complaints of abd pain, denies frank nausea or vomiting, +bm  Objective: Vital signs in last 24 hours: Temp:  [98.5 F (36.9 C)-99.7 F (37.6 C)] 98.5 F (36.9 C) (11/19 0639) Pulse Rate:  [77-92] 77  (11/19 0639) Resp:  [16] 16  (11/19 0639) BP: (103-114)/(46-57) 103/46 mmHg (11/19 0639) SpO2:  [95 %-100 %] 98 % (11/19 0639) Last BM Date: 04/29/12  Intake/Output from previous day: 11/18 0701 - 11/19 0700 In: 5724.6 [P.O.:800; I.V.:2924.6; IV Piggyback:2000] Out: 300 [Urine:300] Intake/Output this shift:    General appearance: alert, cooperative and no distress GI: relatively soft, +BS, incision c/d/i, tender along incision  Lab Results:   Basename 05/01/12 0630 04/30/12 0620  WBC 10.1 12.7*  HGB 9.2* 9.4*  HCT 26.9* 27.9*  PLT 604* 593*   BMET  Basename 04/30/12 0620 04/29/12 0624  NA 133* 135  K 4.1 4.0  CL 99 101  CO2 28 28  GLUCOSE 106* 104*  BUN 6 8  CREATININE 0.59 0.58  CALCIUM 9.0 8.9   PT/INR No results found for this basename: LABPROT:2,INR:2 in the last 72 hours ABG No results found for this basename: PHART:2,PCO2:2,PO2:2,HCO3:2 in the last 72 hours  Studies/Results: No results found.  Anti-infectives: Anti-infectives    None      Assessment/Plan: 1. POD#5- Splenectomy: pt with lots of complaints of abd pain but tolerating diet and having BMs, had long discussion regarding the post-operative course and expected pain.  Will evaluate pain with xray today but feel that this is just normal post-op pain.    --get abd film this am  --will need staples out 10 days post-op.  --hematology note seen, plan to keep patient today  LOS: 8 days    WHITE, ELIZABETH 05/01/2012

## 2012-05-01 NOTE — Progress Notes (Signed)
Discharge per Hematology.  Doing well from surgical standpoint. Difficulty with pain issues secondary to long-standing pain med use.  Wilmon Arms. Corliss Skains, MD, [facility] Surgery  05/01/2012 12:12 PM

## 2012-05-01 NOTE — Progress Notes (Signed)
Is still by abdominal wall pain from his splenectomy. He is getting about better. He said he had a difficult evening. He's also more in the way of sickle cell pain. He's had no fever. He is eating regular food. He is going to the bathroom. He says he still having difficulty with going to the bathroom.  He's had no cough. He continues to use his incentive spirometer.  He is afebrile. Blood pressure is okay. His lungs are clear. Cardiac exam regular in rhythm with no murmurs rubs or bruits. Abdominal exam soft. His bowel sounds are little better. There is no abdominal distention. Extremities shows no clubbing cyanosis or edema.  His CBC shows white cell count to be 10.1, hemoglobin 9.2 and platelets 604.  We'll still continue to monitor him. He just does not feel as if he is ready for discharge. He's worried about the staples and when they can be removed.  His hemoglobin is holding steady. Total bilirubin has come down to almost normal. I do not see much in the way of hemolysis. He continues on his folic acid.  Pete E.

## 2012-05-02 DIAGNOSIS — F411 Generalized anxiety disorder: Secondary | ICD-10-CM

## 2012-05-02 LAB — CBC
HCT: 26.3 % — ABNORMAL LOW (ref 39.0–52.0)
Hemoglobin: 8.8 g/dL — ABNORMAL LOW (ref 13.0–17.0)
MCV: 91.3 fL (ref 78.0–100.0)
RBC: 2.88 MIL/uL — ABNORMAL LOW (ref 4.22–5.81)
WBC: 12.1 10*3/uL — ABNORMAL HIGH (ref 4.0–10.5)

## 2012-05-02 MED ORDER — WARFARIN - PHYSICIAN DOSING INPATIENT: Freq: Every day | Status: DC

## 2012-05-02 MED ORDER — FUROSEMIDE 10 MG/ML IJ SOLN
10.0000 mg | Freq: Once | INTRAMUSCULAR | Status: AC
  Administered 2012-05-02: 10 mg via INTRAVENOUS
  Filled 2012-05-02 (×2): qty 1

## 2012-05-02 MED ORDER — WARFARIN SODIUM 2 MG PO TABS
2.0000 mg | ORAL_TABLET | Freq: Every day | ORAL | Status: DC
  Administered 2012-05-02: 2 mg via ORAL
  Filled 2012-05-02 (×2): qty 1

## 2012-05-02 MED ORDER — ACETAMINOPHEN 325 MG PO TABS
650.0000 mg | ORAL_TABLET | Freq: Once | ORAL | Status: AC
  Administered 2012-05-02: 650 mg via ORAL
  Filled 2012-05-02: qty 2

## 2012-05-02 MED ORDER — HYDROMORPHONE HCL PF 2 MG/ML IJ SOLN
4.0000 mg | INTRAMUSCULAR | Status: DC | PRN
  Administered 2012-05-02: 4 mg via INTRAVENOUS
  Filled 2012-05-02: qty 2

## 2012-05-02 MED ORDER — HYDROMORPHONE HCL PF 4 MG/ML IJ SOLN
6.0000 mg | INTRAMUSCULAR | Status: DC | PRN
  Administered 2012-05-02 – 2012-05-03 (×12): 6 mg via INTRAVENOUS
  Filled 2012-05-02: qty 1
  Filled 2012-05-02 (×7): qty 2
  Filled 2012-05-02: qty 1
  Filled 2012-05-02 (×4): qty 2

## 2012-05-02 NOTE — Progress Notes (Signed)
Agree with above.  Christopher Warren. Corliss Skains, MD, [facility] Surgery  05/02/2012 12:23 PM

## 2012-05-02 NOTE — Progress Notes (Signed)
Patient ID: Christopher Warren, male   DOB: [birth year], 36 y.o.   MRN: [id number]  6 Days Post-Op  Subjective: Pt reports pain better, tolerating diet, denies n/v.  Reports that he is planning discharge tomorrow.  Objective: Vital signs in last 24 hours: Temp:  [98.5 F (36.9 C)-99.9 F (37.7 C)] 99 F (37.2 C) (11/20 0704) Pulse Rate:  [87-107] 87  (11/20 0704) Resp:  [16-20] 20  (11/20 0704) BP: (101-120)/(53-67) 101/58 mmHg (11/20 0704) SpO2:  [98 %-100 %] 98 % (11/20 0704) Last BM Date: 04/29/12  Intake/Output from previous day: 11/19 0701 - 11/20 0700 In: 3567 [P.O.:1320; I.V.:1218; IV Piggyback:1029] Out: 1000 [Urine:1000] Intake/Output this shift:    General appearance: alert, cooperative and no distress GI: relatively soft, +BS, incision c/d/i, tender along incision  Lab Results:   Basename 05/02/12 0500 05/01/12 0630  WBC 12.1* 10.1  HGB 8.8* 9.2*  HCT 26.3* 26.9*  PLT 696* 604*   BMET  Basename 04/30/12 0620  NA 133*  K 4.1  CL 99  CO2 28  GLUCOSE 106*  BUN 6  CREATININE 0.59  CALCIUM 9.0   PT/INR No results found for this basename: LABPROT:2,INR:2 in the last 72 hours ABG No results found for this basename: PHART:2,PCO2:2,PO2:2,HCO3:2 in the last 72 hours  Studies/Results: Dg Abd 1 View  05/01/2012  *RADIOLOGY REPORT*  Clinical Data: Abdominal pain. Status post splenectomy.  ABDOMEN - 1 VIEW  Comparison: None.  Findings: Surgical staples extend across the left upper quadrant. There is air that has a linear and curved appearance in the right upper quadrant.  This it is likely some residual free air, which is suggested in the retroperitoneal space on the right.  A catheter projects in the right upper quadrant over the liver. Surgical vascular clips in the right upper quadrant reflect a cholecystectomy.  The soft tissues are otherwise unremarkable.  There is a normal bowel gas pattern.  No bony abnormality.  IMPRESSION: No convincing acute findings.   There are postsurgical changes including what appears to be a small amount of free right retroperitoneal air.  There is no evidence of obstruction or adynamic ileus.   Original Report Authenticated By: Amie Portland, M.D.     Anti-infectives: Anti-infectives    None      Assessment/Plan: 1. POD#6- Splenectomy: x-ray was negative, seems to be improving.  Ok to discharge tomorrow AM.  --will need staples out 10 days post-op, will schedule this with our office  --hematology note seen, plans for discharge tomorrow.   LOS: 9 days    Aniqa Hare 05/02/2012

## 2012-05-02 NOTE — Progress Notes (Signed)
Walked into room and noticed surpeciious smell of smoke , when asked, pt stated "NO" Asked a second RN's to verified the smell. Keri RN, explained policy to pt about smoke free facility. Pt became verbally aggressive and use foul language. House coverage Wells Guiles notified, and came to the floor to speak with pt,and he allowed her to pull away his trash which contain ashes of unknown source. Pt and family requested another nurse to resume care. At  Shift change unit RN 3, Chiropodist and MD made aware of the situation,

## 2012-05-02 NOTE — Progress Notes (Signed)
Christopher Warren had a bad night.  Apparently, there was a confrontation with one of the staff. There was some concerns about Christopher Warren possibly smoking. He denies all this. He feels as if he is "violated".  He says he's not ready to go home. He says he is under a lot of emotional stress now.  It's hard to get him to understand that there'll be normal postoperative pain. He just has a difficult time comprehending this. He  He is eating okay. He's out of bed okay. There is no nausea vomiting.  His hemoglobin is 8.8. I will go ahead and do an exchange on him today. This will continue to diluted out his sickle cells.  He is afebrile. He's not having any dyspnea. His vital signs are temperature 99.. Pulse 101/58. His lungs are clear. Cardiac exam regular rate and rhythm. There are no murmurs rubs or bruits. Abdominal exam shows the wound that is healing.  We will see about discharging him tomorrow. Again, he is healing up nicely from his splenectomy.  Pete E.

## 2012-05-03 ENCOUNTER — Telehealth (INDEPENDENT_AMBULATORY_CARE_PROVIDER_SITE_OTHER): Payer: Self-pay | Admitting: General Surgery

## 2012-05-03 LAB — PROTIME-INR: INR: 1.14 (ref 0.00–1.49)

## 2012-05-03 LAB — CBC
HCT: 31.1 % — ABNORMAL LOW (ref 39.0–52.0)
Hemoglobin: 10.6 g/dL — ABNORMAL LOW (ref 13.0–17.0)
RBC: 3.47 MIL/uL — ABNORMAL LOW (ref 4.22–5.81)
WBC: 18.5 10*3/uL — ABNORMAL HIGH (ref 4.0–10.5)

## 2012-05-03 LAB — TYPE AND SCREEN
Antibody Screen: NEGATIVE
Unit division: 0
Unit division: 0

## 2012-05-03 LAB — COMPREHENSIVE METABOLIC PANEL
AST: 34 U/L (ref 0–37)
Albumin: 3.4 g/dL — ABNORMAL LOW (ref 3.5–5.2)
Alkaline Phosphatase: 95 U/L (ref 39–117)
Chloride: 97 mEq/L (ref 96–112)
Potassium: 3.9 mEq/L (ref 3.5–5.1)
Total Bilirubin: 1.9 mg/dL — ABNORMAL HIGH (ref 0.3–1.2)
Total Protein: 7.9 g/dL (ref 6.0–8.3)

## 2012-05-03 LAB — RETICULOCYTES
RBC.: 3.47 MIL/uL — ABNORMAL LOW (ref 4.22–5.81)
Retic Count, Absolute: 83.3 10*3/uL (ref 19.0–186.0)

## 2012-05-03 NOTE — Discharge Instructions (Signed)
Keep the incision site clean and dry.  Ok to shower.  Dry area well after shower.  Eat small meals.  Please take your ExJade everyday!!!!  Please take your folic acid everyday.

## 2012-05-03 NOTE — Discharge Summary (Signed)
#  [id number] is d/c note.  Cindee Lame

## 2012-05-03 NOTE — Progress Notes (Signed)
Patient ID: Christopher Warren, male   DOB: Feb 13, 1976, 36 y.o.   MRN: [id number] 7 Days Post-Op  Subjective: Doing much better today, abd sore but pain not as severe, tolerating diet, denies n/v.  Ready for discharge today  Objective: Vital signs in last 24 hours: Temp:  [98 F (36.7 C)-99.5 F (37.5 C)] 98.9 F (37.2 C) (11/21 0519) Pulse Rate:  [75-129] 92  (11/21 0519) Resp:  [18-20] 18  (11/21 0519) BP: (98-132)/(43-74) 122/63 mmHg (11/21 0519) SpO2:  [95 %-100 %] 95 % (11/21 0519) Weight:  [116 lb 9.6 oz (52.889 kg)] 116 lb 9.6 oz (52.889 kg) (11/21 0519) Last BM Date: 04/29/12  Intake/Output from previous day: 11/20 0701 - 11/21 0700 In: 2674.7 [P.O.:1200; I.V.:883; Blood:591.7] Out: 500  Intake/Output this shift:    General appearance: alert, cooperative and no distress GI: relatively soft, +BS, incision c/d/i, tender along incision  Lab Results:   Basename 05/03/12 0500 05/02/12 0500  WBC 18.5* 12.1*  HGB 10.6* 8.8*  HCT 31.1* 26.3*  PLT 631* 696*   BMET  Basename 05/03/12 0500  NA 133*  K 3.9  CL 97  CO2 28  GLUCOSE 107*  BUN 18  CREATININE 0.74  CALCIUM 9.0   PT/INR  Basename 05/03/12 0500  LABPROT 14.4  INR 1.14   ABG No results found for this basename: PHART:2,PCO2:2,PO2:2,HCO3:2 in the last 72 hours  Studies/Results: Dg Abd 1 View  05/01/2012  *RADIOLOGY REPORT*  Clinical Data: Abdominal pain. Status post splenectomy.  ABDOMEN - 1 VIEW  Comparison: None.  Findings: Surgical staples extend across the left upper quadrant. There is air that has a linear and curved appearance in the right upper quadrant.  This it is likely some residual free air, which is suggested in the retroperitoneal space on the right.  A catheter projects in the right upper quadrant over the liver. Surgical vascular clips in the right upper quadrant reflect a cholecystectomy.  The soft tissues are otherwise unremarkable.  There is a normal bowel gas pattern.  No bony  abnormality.  IMPRESSION: No convincing acute findings.  There are postsurgical changes including what appears to be a small amount of free right retroperitoneal air.  There is no evidence of obstruction or adynamic ileus.   Original Report Authenticated By: Amie Portland, M.D.     Anti-infectives: Anti-infectives    None      Assessment/Plan: 1. POD#7- Splenectomy: doing better, plan for discharge today by hematology, ok to discharge from surgical standpoint  --appt scheduled for Monday for staple removal.  --ok to shower and leave incision open to air    LOS: 10 days    Reynald Woods 05/03/2012

## 2012-05-03 NOTE — Discharge Summary (Signed)
NAME:  Christopher Warren, Christopher Warren NO.:  [id number]  MEDICAL RECORD NO.:  [id number]  LOCATION:  1333                         FACILITY:  [facility]  PHYSICIAN:  [name], M.D.  DATE OF BIRTH:  1976-06-11  DATE OF ADMISSION:  04/23/2012 DATE OF DISCHARGE:  05/03/2012                              DISCHARGE SUMMARY   DIAGNOSES UPON DISCHARGE: 1. 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 2. Splenectomy by Dr. Magnus Ivan. 3. Hemoglobin SS disease with chronic pain. 4. Hemolysis secondary to splenomegaly-resolved status post     splenectomy. 5. Iron overload.  CONDITION UPON DISCHARGE:  Stable.  ACTIVITIES:  As tolerated.  He cannot lift or exert himself too heavily because of the splenectomy.  FOLLOWUP: 1. He will follow up with Dr. Magnus Ivan at [facility] in     about 1 week or so. 2. He will follow up with Dr. Myna Hidalgo at the [facility] in 10 days.  MEDICATIONS UPON DISCHARGE: 1. Albuterol inhaler 2 puffs q.6 hours p.r.n. 2. Exjade 1000 mg p.o. daily. 3. Benadryl 25 mg p.o. q.6 hours p.r.n. 4. Folic acid 1 mg p.o. daily. 5. Dilaudid 8 mg p.o. q. 4-6 hours p.r.n. pain. 6. Exalgo 16 mg p.o. daily. 7. Hydrea 500 mg p.o. t.i.d. 8. Phenergan 25 mg p.o. q.8 hours p.r.n. nausea. 9. Restoril 15 mg p.o. at bedtime p.r.n. 10.Coumadin 2 mg p.o. at bedtime.  HOSPITAL COURSE:  Mr. Kiper was admitted because of splenic pain and his need for splenectomy.  His spleen was quite large for a sickle cell patient.  I felt this caused him to hemolyze more.  We transfused him weekly.  When he came in, his hemoglobin was 7.9.  His retic count was 9.7.  We did do exchange transfusions to get him ready for surgery.  He went to surgery I think on the 14th.  At that point in time, his hemoglobin is up to 9.7.  He underwent splenectomy.  It was an open splenectomy.  The path report on the spleen did not show any evidence of malignancy.   He had areas of hemorrhage.  He had abundant hemosiderin.  He had abundant foreign bodies.  He had typical of postop discomfort.  He did not have any crisis postop.  Amazingly, his retic count continued to improve.  His hemoglobin went up to 9.5.  His retic count upon discharge was down to 2.4%.  He was not having any problems with fever.  He had no cough, shortness of breath.  He was using the incentive spirometer.  Upon discharge, his white cell count was 18.5, hemoglobin 10.6, hematocrit 31.1, and platelet count was 631.  Again, his retic count was 2.4.  His total bilirubin upon discharge was 1.9.  Again his hemolysis had decreased quite nicely.  He is ambulating.  He is walking.  He is eating well.  He has had bowel movements.  He will be followed up by General Surgery as an outpatient to remove the staples.  He did receive 1 exchange transfusion on the 20th.  I felt this may have been helpful with respect to any kind of sickle crisis.  PHYSICAL EXAMINATION UPON DISCHARGE:  VITAL SIGNS:  Temperature 98.9, pulse is 92, respiratory rate 18, blood pressure 122/63, oxygen saturation is 95%. HEAD AND NECK:  Shows no ocular or oral lesions.  There is no scleral icterus.  There is no adenopathy in the neck. LUNGS:  Clear bilaterally. CARDIAC:  Regular rate and rhythm with normal S1, S2.  There are no murmurs, rubs, or bruits. ABDOMEN:  Soft.  His bowel sounds were active.  He has the splenectomy scar that is dressed.  There is no abdominal distention. EXTREMITIES:  Shows no clubbing, cyanosis, or edema.     [name], M.D.     PRE/MEDQ  D:  05/03/2012  T:  05/03/2012  Job:  [id number]

## 2012-05-03 NOTE — Telephone Encounter (Signed)
Marisue Ivan called to set up nurse only visit for pt to have staples removed on Monday 05-07-12/ Also Dr. Magnus Ivan could do a check of wound at same time per Liz/gy

## 2012-05-03 NOTE — Progress Notes (Signed)
Agree with above.  Wilmon Arms. Corliss Skains, MD, [facility]  05/03/2012 10:44 AM

## 2012-05-03 NOTE — Progress Notes (Signed)
During blood transfusion pt called his nurse into his room.  Upon entering room pt was noted to have pulled out his port access.  Christopher Warren called to room to re-access port.  Immediately after port was accessed pt refused to allow the remainder of transfusion to infuse because he felt pain medication was more important that the remaining 50ml.  Counseled pt on the importance of receiving the entire transfusion, pt verbalized understanding however insisted on stopping transfusion in order to have pain medication.

## 2012-05-07 ENCOUNTER — Encounter (INDEPENDENT_AMBULATORY_CARE_PROVIDER_SITE_OTHER): Payer: Self-pay | Admitting: Surgery

## 2012-05-07 ENCOUNTER — Ambulatory Visit (INDEPENDENT_AMBULATORY_CARE_PROVIDER_SITE_OTHER): Payer: Medicaid Other | Admitting: Surgery

## 2012-05-07 ENCOUNTER — Encounter (INDEPENDENT_AMBULATORY_CARE_PROVIDER_SITE_OTHER): Payer: Medicaid Other

## 2012-05-07 VITALS — BP 112/78 | HR 84 | Temp 98.2°F | Resp 16 | Ht 64.0 in | Wt 124.8 lb

## 2012-05-07 DIAGNOSIS — Z09 Encounter for follow-up examination after completed treatment for conditions other than malignant neoplasm: Secondary | ICD-10-CM

## 2012-05-07 NOTE — Progress Notes (Signed)
Subjective:     Patient ID: Christopher Warren, male   DOB: 1975-10-01, 36 y.o.   MRN: [id number]  HPI He is here for his first postop visit status post splenectomy for splenomegaly and sickle cell. He feels well today is having minimal pain. His last hemoglobin was 10.6  Review of Systems     Objective:   Physical Exam His incision is well-healed. I removed his staples and place Steri-Strips    Assessment:     Patient stable postop    Plan:     I told him to refrain from heavy lifting until Christmas Eve. He may return to cardio activity. I renewed his Dilaudid. I will see him back as needed

## 2012-05-11 ENCOUNTER — Ambulatory Visit (HOSPITAL_BASED_OUTPATIENT_CLINIC_OR_DEPARTMENT_OTHER): Payer: Medicaid Other | Admitting: Hematology & Oncology

## 2012-05-11 ENCOUNTER — Other Ambulatory Visit: Payer: Medicaid Other | Admitting: Lab

## 2012-05-11 ENCOUNTER — Other Ambulatory Visit: Payer: Self-pay

## 2012-05-11 ENCOUNTER — Ambulatory Visit (HOSPITAL_BASED_OUTPATIENT_CLINIC_OR_DEPARTMENT_OTHER): Payer: Medicaid Other

## 2012-05-11 VITALS — BP 126/48 | HR 95 | Temp 98.3°F | Resp 20 | Wt 126.0 lb

## 2012-05-11 DIAGNOSIS — R52 Pain, unspecified: Secondary | ICD-10-CM

## 2012-05-11 DIAGNOSIS — G4701 Insomnia due to medical condition: Secondary | ICD-10-CM

## 2012-05-11 DIAGNOSIS — D571 Sickle-cell disease without crisis: Secondary | ICD-10-CM

## 2012-05-11 DIAGNOSIS — D5700 Hb-SS disease with crisis, unspecified: Secondary | ICD-10-CM

## 2012-05-11 LAB — CBC WITH DIFFERENTIAL (CANCER CENTER ONLY)
BASO#: 0.1 10*3/uL (ref 0.0–0.2)
BASO%: 0.4 % (ref 0.0–2.0)
EOS%: 3.3 % (ref 0.0–7.0)
HCT: 30.4 % — ABNORMAL LOW (ref 38.7–49.9)
HGB: 10.3 g/dL — ABNORMAL LOW (ref 13.0–17.1)
LYMPH#: 3.2 10*3/uL (ref 0.9–3.3)
LYMPH%: 13.7 % — ABNORMAL LOW (ref 14.0–48.0)
MCH: 32.2 pg (ref 28.0–33.4)
MCHC: 33.9 g/dL (ref 32.0–35.9)
MONO%: 7.3 % (ref 0.0–13.0)
NEUT%: 75.3 % (ref 40.0–80.0)
RDW: 16.2 % — ABNORMAL HIGH (ref 11.1–15.7)

## 2012-05-11 LAB — BASIC METABOLIC PANEL
BUN: 8 mg/dL (ref 6–23)
CO2: 24 mEq/L (ref 19–32)
Chloride: 110 mEq/L (ref 96–112)
Creatinine, Ser: 0.53 mg/dL (ref 0.50–1.35)
Potassium: 3.7 mEq/L (ref 3.5–5.3)

## 2012-05-11 LAB — IRON AND TIBC
%SAT: 91 % — ABNORMAL HIGH (ref 20–55)
UIBC: 23 ug/dL — ABNORMAL LOW (ref 125–400)

## 2012-05-11 MED ORDER — PROMETHAZINE HCL 25 MG/ML IJ SOLN
25.0000 mg | Freq: Four times a day (QID) | INTRAMUSCULAR | Status: DC | PRN
  Administered 2012-05-11: 25 mg via INTRAVENOUS

## 2012-05-11 MED ORDER — SODIUM CHLORIDE 0.9 % IJ SOLN
10.0000 mL | INTRAMUSCULAR | Status: DC | PRN
  Administered 2012-05-11: 10 mL via INTRAVENOUS
  Filled 2012-05-11: qty 10

## 2012-05-11 MED ORDER — TEMAZEPAM 15 MG PO CAPS: 15.0000 mg | ORAL_CAPSULE | Freq: Every evening | ORAL | Status: DC | PRN

## 2012-05-11 MED ORDER — HYDROMORPHONE HCL 8 MG PO TABS: 8.0000 mg | ORAL_TABLET | Freq: Four times a day (QID) | ORAL | Status: DC | PRN

## 2012-05-11 MED ORDER — SODIUM CHLORIDE 0.9 % IV SOLN
500.0000 mL | INTRAVENOUS | Status: DC
  Administered 2012-05-11: 13:00:00 via INTRAVENOUS

## 2012-05-11 MED ORDER — HEPARIN SOD (PORK) LOCK FLUSH 100 UNIT/ML IV SOLN
500.0000 [IU] | Freq: Once | INTRAVENOUS | Status: AC
  Administered 2012-05-11: 500 [IU] via INTRAVENOUS
  Filled 2012-05-11: qty 5

## 2012-05-11 MED ORDER — HYDROMORPHONE HCL PF 4 MG/ML IJ SOLN
6.0000 mg | Freq: Once | INTRAMUSCULAR | Status: AC
  Administered 2012-05-11: 6 mg via INTRAVENOUS

## 2012-05-11 MED ORDER — HYDROMORPHONE HCL ER 16 MG PO T24A: 16.0000 mg | EXTENDED_RELEASE_TABLET | ORAL | Status: DC

## 2012-05-11 NOTE — Addendum Note (Signed)
Addended by: Arlan Organ R on: 05/11/2012 02:36 PM   Modules accepted: Orders

## 2012-05-11 NOTE — Patient Instructions (Signed)

## 2012-05-11 NOTE — Progress Notes (Signed)
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 3. Iron overload.  CURRENT THERAPY: 1. Exjade I think 1000 mg p.o. daily. 2. Folic acid 1 mg p.o. daily. 3. Hydrea 500 mg p.o. t.i.d. 4. Coumadin 2 mg p.o. daily for Port-A-Cath patency.  INTERIM HISTORY:  Christopher Warren comes in for followup.  This is probably the best I have seen him look in a long time.  We had to get his spleen taken out.  He had splenomegaly.  This is quite unusual for sickle cell patients.  His spleen was infarcting.  It was causing an incredible amount of pain.  The spleen was also causing sequestration and also what I believe is crises.  The spleen was taken out on November 14.  The pathology report did not show any malignancy within the spleen.  While in the hospital, his blood count improved nicely.  His reticulocyte count kept going down now.  He did fairly well.  He saw his surgeons recently.  He still has the Steri-Strips on the incision.  This lesion is healing.  He is still having pain which is not surprising.  As far as his sickle cell pain goes, this seems to be doing a little bit better.  He is not as uncomfortable.  He is not as diaphoretic.  He does not feel as worn out or short of breath.  He says he is taking his Exjade.  Hopefully, this will help with the issues with the iron overload.  His last ferritin back on November 11 was 4700.  PHYSICAL EXAMINATION:  General:  This is a well-developed, well- nourished black gentleman in no obvious distress.  Vital signs:  Show temperature of 98.3, pulse 95, respiratory rate 20, blood pressure 126/48.  Weight is 126.  Head and neck:  Shows a normocephalic, atraumatic skull.  There are no ocular or oral lesions.  There are no palpable cervical or supraclavicular lymph nodes.  Lungs:  Clear bilaterally.  Cardiac:  Regular rate and rhythm with a normal S1 and S2. There are no murmurs, rubs or bruits.  Abdomen:  Soft with good  bowel sounds.  There is no palpable abdominal mass.  He is tender on the left side.  His surgical incision for his splenectomy is healing nicely. Back:  Shows no tenderness over the spine, ribs or hips.  Extremities: Show no clubbing, cyanosis or edema.  He has good range motion of his joints.  Neurologic:  No focal neurological deficits.  LABORATORY STUDIES:  White cell count 23.6, hemoglobin 10.3, hematocrit 30.4, platelet count 465.  MCV is 95.  IMPRESSION:  Christopher Warren is a 36 year old gentleman with hemoglobin SS disease.  We got his spleen out secondary to splenomegaly.  Hopefully, this will help with his hemolysis and also with pain issues.  I am glad that his hemoglobin is maintaining itself.  I cannot remember the last time he had a hemoglobin above 10.  Typically his hemoglobin runs around 7.  He does have the leukocytosis and thrombocytosis.  This may be from his splenectomy.  We will plan to get him back in another 2-3 weeks.  I think that he probably needs relatively close observation for right now.  I am just glad that he is feeling better and that hopefully he will improve with respect to pain issues now that the spleen is out and his sickling is slowing down.    ______________________________ [name], M.D. PRE/MEDQ  D:  05/11/2012  T:  05/11/2012  Job:  [id number]

## 2012-05-11 NOTE — Progress Notes (Signed)
This office note has been dictated.

## 2012-05-15 LAB — HEMOGLOBINOPATHY EVALUATION
Hemoglobin Other: 0 %
Hgb A2 Quant: 3 % (ref 2.2–3.2)
Hgb A: 69.1 % — ABNORMAL LOW (ref 96.8–97.8)

## 2012-05-21 ENCOUNTER — Other Ambulatory Visit (HOSPITAL_BASED_OUTPATIENT_CLINIC_OR_DEPARTMENT_OTHER): Payer: Medicaid Other | Admitting: Lab

## 2012-05-21 ENCOUNTER — Encounter (HOSPITAL_BASED_OUTPATIENT_CLINIC_OR_DEPARTMENT_OTHER): Payer: Self-pay | Admitting: *Deleted

## 2012-05-21 ENCOUNTER — Emergency Department (HOSPITAL_BASED_OUTPATIENT_CLINIC_OR_DEPARTMENT_OTHER)
Admission: EM | Admit: 2012-05-21 | Discharge: 2012-05-21 | Disposition: A | Discharge status: Home / Self Care | Payer: Medicaid Other | Attending: Emergency Medicine | Admitting: Emergency Medicine

## 2012-05-21 DIAGNOSIS — R112 Nausea with vomiting, unspecified: Secondary | ICD-10-CM | POA: Insufficient documentation

## 2012-05-21 DIAGNOSIS — J45909 Unspecified asthma, uncomplicated: Secondary | ICD-10-CM | POA: Insufficient documentation

## 2012-05-21 DIAGNOSIS — D571 Sickle-cell disease without crisis: Secondary | ICD-10-CM

## 2012-05-21 DIAGNOSIS — R011 Cardiac murmur, unspecified: Secondary | ICD-10-CM | POA: Insufficient documentation

## 2012-05-21 DIAGNOSIS — M545 Low back pain, unspecified: Secondary | ICD-10-CM | POA: Insufficient documentation

## 2012-05-21 DIAGNOSIS — D5700 Hb-SS disease with crisis, unspecified: Secondary | ICD-10-CM

## 2012-05-21 DIAGNOSIS — D7389 Other diseases of spleen: Secondary | ICD-10-CM | POA: Insufficient documentation

## 2012-05-21 DIAGNOSIS — R5383 Other fatigue: Secondary | ICD-10-CM | POA: Insufficient documentation

## 2012-05-21 DIAGNOSIS — Z7901 Long term (current) use of anticoagulants: Secondary | ICD-10-CM | POA: Insufficient documentation

## 2012-05-21 DIAGNOSIS — R5381 Other malaise: Secondary | ICD-10-CM | POA: Insufficient documentation

## 2012-05-21 DIAGNOSIS — Z79899 Other long term (current) drug therapy: Secondary | ICD-10-CM | POA: Insufficient documentation

## 2012-05-21 DIAGNOSIS — F17200 Nicotine dependence, unspecified, uncomplicated: Secondary | ICD-10-CM | POA: Insufficient documentation

## 2012-05-21 LAB — CBC WITH DIFFERENTIAL (CANCER CENTER ONLY)
BASO#: 0.1 10*3/uL (ref 0.0–0.2)
BASO%: 0.5 % (ref 0.0–2.0)
EOS%: 4.9 % (ref 0.0–7.0)
HGB: 9.9 g/dL — ABNORMAL LOW (ref 13.0–17.1)
LYMPH#: 3.1 10*3/uL (ref 0.9–3.3)
MCH: 31.9 pg (ref 28.0–33.4)
MCHC: 33.7 g/dL (ref 32.0–35.9)
MONO%: 12.2 % (ref 0.0–13.0)
NEUT#: 8.9 10*3/uL — ABNORMAL HIGH (ref 1.5–6.5)
Platelets: 462 10*3/uL — ABNORMAL HIGH (ref 145–400)
RDW: 16.9 % — ABNORMAL HIGH (ref 11.1–15.7)

## 2012-05-21 MED ORDER — HYDROMORPHONE HCL PF 2 MG/ML IJ SOLN
2.0000 mg | Freq: Once | INTRAMUSCULAR | Status: AC
  Administered 2012-05-21: 2 mg via INTRAVENOUS
  Filled 2012-05-21: qty 1

## 2012-05-21 MED ORDER — DIPHENHYDRAMINE HCL 50 MG/ML IJ SOLN
25.0000 mg | Freq: Once | INTRAMUSCULAR | Status: AC
  Administered 2012-05-21: 25 mg via INTRAVENOUS
  Filled 2012-05-21: qty 1

## 2012-05-21 MED ORDER — SODIUM CHLORIDE 0.9 % IV SOLN
INTRAVENOUS | Status: DC
  Administered 2012-05-21: 17:00:00 via INTRAVENOUS

## 2012-05-21 MED ORDER — PROMETHAZINE HCL 25 MG/ML IJ SOLN
25.0000 mg | INTRAMUSCULAR | Status: DC | PRN
  Administered 2012-05-21 (×2): 25 mg via INTRAVENOUS
  Filled 2012-05-21 (×2): qty 1

## 2012-05-21 MED ORDER — SODIUM CHLORIDE 0.9 % IV BOLUS (SEPSIS)
500.0000 mL | Freq: Once | INTRAVENOUS | Status: AC
  Administered 2012-05-21: 500 mL via INTRAVENOUS

## 2012-05-21 MED ORDER — HEPARIN SOD (PORK) LOCK FLUSH 100 UNIT/ML IV SOLN
INTRAVENOUS | Status: AC
  Administered 2012-05-21: 500 [IU]
  Filled 2012-05-21: qty 5

## 2012-05-21 NOTE — ED Notes (Addendum)
Sickle cell pain. States he sees Dr Myna Hidalgo. Had his porta cath accessed and blood work done in the office and was told to come here for pain management and IV fluids.

## 2012-05-21 NOTE — ED Provider Notes (Signed)
History  This chart was scribed for Christopher B. Bernette Mayers, MD by Ardeen Jourdain, ED Scribe. This patient was seen in room MH10/MH10 and the patient's care was started at 1503.  CSN: [id number]  Arrival date & time 05/21/12  1330   First MD Initiated Contact with Patient 05/21/12 1503      Chief Complaint  Patient presents with  . Sickle Cell Pain Crisis     The history is provided by the patient. No language interpreter was used.   Christopher Warren is a 36 y.o. male who presents to the Emergency Department complaining of a sickle cell pain crisis with associated low energy, weakness nausea and emesis. He states the pain started 4 days ago. He locates the pain in his lower back. He reports being see by Dr. Myna Hidalgo earlier today who did his labs, but he was sent to the ED for pain management and IV fluids. He states having a splenectomy a few weeks ago and feeling better after that until 4 days ago. He reports taking his medications with no relief.   PCP: Dr. Myna Hidalgo  Past Medical History  Diagnosis Date  . Sickle cell anemia   . Asthma   . 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 Heart murmur     Past Surgical History  Procedure Date  . Cholecystectomy   . Splenectomy, total 04/26/2012    Procedure: SPLENECTOMY;  Surgeon: Shelly Rubenstein, MD;  Location: [facility];  Service: General;  Laterality: N/A;    Family History  Problem Relation Age of Onset  . Sickle cell anemia Mother   . Sickle cell anemia Son     History  Substance Use Topics  . Smoking status: Current Every Day Smoker -- 1.0 packs/day for 5 years    Types: Cigarettes  . Smokeless tobacco: Never Used  . Alcohol Use: No      Review of Systems  All other systems reviewed and are negative.   A complete 10 system review of systems was obtained and all systems are negative except as noted in the HPI and PMH.    Allergies  Morphine and related; Adhesive; and Latex  Home Medications   Current Outpatient  Rx  Name  Route  Sig  Dispense  Refill  . ALBUTEROL SULFATE HFA 108 (90 BASE) MCG/ACT IN AERS   Inhalation   Inhale 2 puffs into the lungs every 6 (six) hours as needed for wheezing.   2 Inhaler   4   . DEFERASIROX 500 MG PO TBSO   Oral   Take 2 tablets (1,000 mg total) by mouth daily.   60 tablet   6   . DIPHENHYDRAMINE HCL 25 MG PO CAPS   Oral   Take 25 mg by mouth every 6 (six) hours as needed. Itching.         Marland Kitchen FOLIC ACID 1 MG PO TABS   Oral   Take 1 mg by mouth daily.          Marland Kitchen HYDROMORPHONE HCL 8 MG PO TABS   Oral   Take 1 tablet (8 mg total) by mouth every 6 (six) hours as needed. For pain   120 tablet   0   . HYDROMORPHONE HCL ER 16 MG PO T24A   Oral   Take 1 tablet (16 mg total) by mouth daily.   30 tablet   0   . HYDROXYUREA 500 MG PO CAPS   Oral   Take 1 capsule (500 mg  total) by mouth 3 (three) times daily.   90 capsule   1   . PROMETHAZINE HCL 25 MG PO TABS   Oral   Take 25 mg by mouth every 8 (eight) hours as needed. Nausea.         Marland Kitchen TEMAZEPAM 15 MG PO CAPS   Oral   Take 1 capsule (15 mg total) by mouth at bedtime as needed for sleep.   30 capsule   2   . WARFARIN SODIUM 2 MG PO TABS   Oral   Take 1 tablet (2 mg total) by mouth daily.   30 tablet   6     Triage Vitals: BP 116/44  Pulse 94  Temp 98.5 F (36.9 C) (Oral)  Resp 20  SpO2 98%  Physical Exam  Nursing note and vitals reviewed. Constitutional: He is oriented to person, place, and time. He appears well-developed and well-nourished.  HENT:  Head: Normocephalic and atraumatic.  Eyes: EOM are normal. Pupils are equal, round, and reactive to light.  Neck: Normal range of motion. Neck supple.  Cardiovascular: Normal rate, regular rhythm, normal heart sounds and intact distal pulses.   Pulmonary/Chest: Effort normal and breath sounds normal.  Abdominal: Soft. Bowel sounds are normal. He exhibits no distension. There is no tenderness.  Musculoskeletal: Normal range of  motion. He exhibits no edema and no tenderness.       Soft tissue tenderness in lower back  Neurological: He is alert and oriented to person, place, and time. He has normal strength. No cranial nerve deficit or sensory deficit.  Skin: Skin is warm and dry. No rash noted.  Psychiatric: He has a normal mood and affect.    ED Course  Procedures (including critical care time)  DIAGNOSTIC STUDIES: Oxygen Saturation is 98% on room air, normal by my interpretation.    COORDINATION OF CARE:  3:06 PM: Discussed treatment plan which includes phenergan, pain medication and benadryl with pt at bedside and pt agreed to plan.    Labs Reviewed - No data to display No results found.   No diagnosis found.    MDM  Pt with history of frequent sickle pain crises who reports some improvement with recent splenectomy but more pain the last few days. Unable to be seen at Hematologists office this afternoon except for blood draw so sent to the ED for pain control.      I personally performed the services described in this documentation, which was scribed in my presence. The recorded information has been reviewed and is accurate.     Christopher B. Bernette Mayers, MD 05/21/12 [phone]

## 2012-05-23 LAB — HEMOGLOBINOPATHY EVALUATION
Hemoglobin Other: 0 %
Hgb A2 Quant: 2.7 % (ref 2.2–3.2)
Hgb F Quant: 5.6 % — ABNORMAL HIGH (ref 0.0–2.0)

## 2012-05-23 LAB — COMPREHENSIVE METABOLIC PANEL
ALT: 29 U/L (ref 0–53)
AST: 27 U/L (ref 0–37)
Albumin: 4 g/dL (ref 3.5–5.2)
Alkaline Phosphatase: 96 U/L (ref 39–117)
BUN: 7 mg/dL (ref 6–23)
Calcium: 9.6 mg/dL (ref 8.4–10.5)
Chloride: 107 mEq/L (ref 96–112)
Potassium: 4.2 mEq/L (ref 3.5–5.3)
Sodium: 138 mEq/L (ref 135–145)

## 2012-05-23 LAB — RETICULOCYTES (CHCC)
ABS Retic: 127.6 10*3/uL (ref 19.0–186.0)
Retic Ct Pct: 4 % — ABNORMAL HIGH (ref 0.4–2.3)

## 2012-05-23 LAB — FERRITIN: Ferritin: 3430 ng/mL — ABNORMAL HIGH (ref 22–322)

## 2012-05-24 ENCOUNTER — Encounter (HOSPITAL_COMMUNITY)
Admission: RE | Admit: 2012-05-24 | Discharge: 2012-05-24 | Disposition: A | Discharge status: Home / Self Care | Payer: Medicaid Other | Source: Ambulatory Visit | Attending: Hematology & Oncology | Admitting: Hematology & Oncology

## 2012-05-24 DIAGNOSIS — D571 Sickle-cell disease without crisis: Secondary | ICD-10-CM | POA: Insufficient documentation

## 2012-05-25 ENCOUNTER — Other Ambulatory Visit (HOSPITAL_BASED_OUTPATIENT_CLINIC_OR_DEPARTMENT_OTHER): Payer: Medicaid Other | Admitting: Lab

## 2012-05-25 ENCOUNTER — Ambulatory Visit (HOSPITAL_BASED_OUTPATIENT_CLINIC_OR_DEPARTMENT_OTHER): Payer: Medicaid Other | Admitting: Hematology & Oncology

## 2012-05-25 ENCOUNTER — Ambulatory Visit (HOSPITAL_BASED_OUTPATIENT_CLINIC_OR_DEPARTMENT_OTHER): Payer: Medicaid Other

## 2012-05-25 VITALS — BP 118/50 | HR 92 | Temp 98.5°F | Resp 18 | Ht 64.0 in | Wt 133.0 lb

## 2012-05-25 DIAGNOSIS — D571 Sickle-cell disease without crisis: Secondary | ICD-10-CM

## 2012-05-25 DIAGNOSIS — D5700 Hb-SS disease with crisis, unspecified: Secondary | ICD-10-CM

## 2012-05-25 DIAGNOSIS — R52 Pain, unspecified: Secondary | ICD-10-CM

## 2012-05-25 LAB — CBC WITH DIFFERENTIAL (CANCER CENTER ONLY)
BASO#: 0.1 10*3/uL (ref 0.0–0.2)
EOS%: 6.1 % (ref 0.0–7.0)
HCT: 26.1 % — ABNORMAL LOW (ref 38.7–49.9)
HGB: 9 g/dL — ABNORMAL LOW (ref 13.0–17.1)
LYMPH%: 20.5 % (ref 14.0–48.0)
MCH: 32.3 pg (ref 28.0–33.4)
MCHC: 34.5 g/dL (ref 32.0–35.9)
MCV: 94 fL (ref 82–98)
MONO%: 9.2 % (ref 0.0–13.0)
NEUT#: 13.2 10*3/uL — ABNORMAL HIGH (ref 1.5–6.5)
NEUT%: 63.6 % (ref 40.0–80.0)

## 2012-05-25 MED ORDER — HEPARIN SOD (PORK) LOCK FLUSH 100 UNIT/ML IV SOLN
500.0000 [IU] | Freq: Once | INTRAVENOUS | Status: AC
  Administered 2012-05-25: 500 [IU] via INTRAVENOUS
  Filled 2012-05-25: qty 5

## 2012-05-25 MED ORDER — SODIUM CHLORIDE 0.9 % IJ SOLN
10.0000 mL | INTRAMUSCULAR | Status: DC | PRN
  Administered 2012-05-25: 10 mL via INTRAVENOUS
  Filled 2012-05-25: qty 10

## 2012-05-25 MED ORDER — DIPHENHYDRAMINE HCL 50 MG/ML IJ SOLN
12.5000 mg | Freq: Once | INTRAMUSCULAR | Status: AC
  Administered 2012-05-25: 12.5 mg via INTRAVENOUS

## 2012-05-25 MED ORDER — PROMETHAZINE HCL 25 MG/ML IJ SOLN
25.0000 mg | Freq: Four times a day (QID) | INTRAMUSCULAR | Status: DC | PRN
  Administered 2012-05-25: 25 mg via INTRAVENOUS

## 2012-05-25 MED ORDER — HYDROMORPHONE HCL PF 4 MG/ML IJ SOLN
6.0000 mg | Freq: Once | INTRAMUSCULAR | Status: AC
  Administered 2012-05-25: 6 mg via INTRAVENOUS

## 2012-05-25 MED ORDER — SODIUM CHLORIDE 0.9 % IV SOLN
INTRAVENOUS | Status: AC
  Administered 2012-05-25: 10:00:00 via INTRAVENOUS

## 2012-05-25 NOTE — Progress Notes (Signed)
This office note has been dictated.

## 2012-05-25 NOTE — Patient Instructions (Addendum)
Sickle Cell Anemia °Sickle cell anemia needs regular medical care by your caregiver and awareness by you when to seek medical care. Pain is a common problem in children with sickle cell disease. This usually starts at less than 34 year of age. Pain can occur nearly anywhere in the body, but most commonly happens in the extremities, back, chest, or belly (abdomen). Pain episodes can start suddenly or may follow an illness. These attacks can appear as decreased activity, loss of appetite, change in behavior, or simply complaints of pain. °DIAGNOSIS  °· Specialized blood and gene testing can help make this diagnosis early in the disease. Blood tests may then be done to watch blood levels. °· Specialized brain scans are done when there are problems in the brain during a crisis. °· Lung testing may be done later in the disease. °HOME CARE INSTRUCTIONS  °· Maintain good hydration. Increase your child's fluid intake in hot weather and during exercise. °· Avoid smoking around your child. Smoking lowers the oxygen in the blood and can cause sickling. °· Control pain. Only give your child over-the-counter or prescription medicines for pain, discomfort, or fever as directed by their caregiver. Do not give aspirin to children because of the association with Reye's syndrome. °· Keep regular health care checks to keep a proper red blood cell (hemoglobin) level. A moderate anemia level protects against sickling crises. °· You or your child should receive all the same immunizations and care as the people around them. °· Moms should breastfeed their babies if possible. Use formulas with iron added if breastfeeding is not possible. Additional iron should not be given unless there is a lack of it. People with SCD build up iron faster than normal. Give folic acid and additional vitamins as directed. °· If you or your child has been prescribed antibiotics or other medications to prevent problems, give them as directed. °· Summer camps  are available for children with SCD. They may help young people deal with their disease. The camps introduce them to other children with the same condition. °· Young people with SCD may become frustrated or angry at their disease. This can cause rebellion and refusal to follow medical care. Help groups or counseling may help with these problems. °· Make sure your child wears a medical alert bracelet. When traveling, keep your medical information, caregiver's names, and the medications your child takes with you at all times. °SEEK IMMEDIATE MEDICAL CARE IF:  °· You or your child feels dizzy or faint. °· You or your child develops a new onset of abdominal pain, especially on the left side near the stomach area. °· You or your child develops a persistent, often uncomfortable and painful penile erection. This is called priapism. Always check young boys for this. It is often embarrassing for them and they may not bring it to your attention. This is a medical emergency and needs immediate treatment. If this is not treated it will lead to impotence. °· You or your child develops numbness in or has a hard time moving arms and legs. °· You or your child has a hard time with speech. °· You have a fever. °· You or your child develops signs of infection (chills, lethargy, irritability, poor eating, vomiting). The younger the child, the more you should be concerned. °· With fevers, do not give medications to lower the fever right away. This could cover up a problem that is developing. Notify your caregiver. °· You or your child develops pain that is   not helped with medicine. °· You or your child develops shortness of breath, or is coughing up pus-like or bloody sputum. °· You or your child develops any problems that are new and are causing you to worry. °Document Released: 09/07/2005 Document Revised: 08/22/2011 Document Reviewed: 10/28/2009 °[employer]® Patient Information ©2013 [employer]. ° °

## 2012-05-26 NOTE — Progress Notes (Signed)
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  CURRENT THERAPY: 1. Folic acid 1 mg p.o. daily. 2. Hydrea 500 mg p.o. t.i.d. 3. Exjade 1000 mg p.o. daily. 4. Coumadin 2 mg p.o. daily.  INTERIM HISTORY:  The patient comes in for followup.  He continues to feel okay although he is having some pain issues.  He mostly has the pain in his legs. He has done much better since his spleen was taken out.  Having the spleen out has really limited his crises.  When we saw him a few days ago, his hemoglobin electrophoresis showed a hemoglobin A level 63% and hemoglobin S of only 28%.  I told him that he could not have a crisis with these numbers.  His hemoglobin F is 5.6.  This hopefully is an indicator that he is taking the Hydrea.  He has not had an exchange transfusion now for about 6 weeks.  His blood count really has never been this good.  Having his spleen taken out was a huge plus for him.  Most importantly, however, is the fact that his ferritin is coming down. His ferritin is now 3430.  He says he is taking his Exjade.  He has had no cough.  He is trying to exercise a little bit more.  He has recovered from his splenectomy quite nicely.  He has had no headache.  He has had a little nausea but no vomiting.  PHYSICAL EXAMINATION:  General: This is a fairly well-developed, well- nourished black gentleman in no obvious distress.  Vital signs:  Show a temperature of 98.5, pulse 93, respiratory rate 18, blood pressure 118/50.  Weight is 133.  Head and neck:  Normocephalic, atraumatic skull.  There are no ocular or oral lesions.  There are no palpable cervical or supraclavicular lymph nodes.  Lungs:  Clear bilaterally. Cardiac:  Regular rate and rhythm with a normal S1 and S2.  There are no murmurs, rubs or bruits.  Abdomen:  Soft with good bowel sounds.  There is still some slight tenderness in the left quadrant.  His splenectomy scar has healed nicely.  No hepatomegaly is  noted.  Back:  Exam shows some slight tenderness along the spine.  Some spasms noted in the lumbar paravertebral muscles.  Extremities:  Show some tenderness over the long bones.  Neurologic:  No focal neurological deficits.  LABORATORY STUDIES:  White cell count is 20.8, hemoglobin 9, hematocrit 26.1, platelet count 393.  MCV is 94.  IMPRESSION:  Christopher Warren is a 36 year old black gentleman with hemoglobin SS disease.  He is one of the more "active" hemolyzers that we have. Again, having his spleen out has helped him.  I kept on him about taking the Exjade.  I told him that this is incredibly important for him.  We will go ahead and give some IV fluids and pain medications today.  I believe that this probably will be a monthly routine for him.  This will, in my mind, keep him out of the hospital, which is probably the best thing we can do for him.  We will see him back in a month.    ______________________________ [name], M.D. PRE/MEDQ  D:  05/25/2012  T:  05/26/2012  Job:  [id number]

## 2012-05-29 LAB — COMPREHENSIVE METABOLIC PANEL
ALT: 36 U/L (ref 0–53)
AST: 42 U/L — ABNORMAL HIGH (ref 0–37)
Albumin: 3.9 g/dL (ref 3.5–5.2)
Alkaline Phosphatase: 99 U/L (ref 39–117)
Calcium: 8.6 mg/dL (ref 8.4–10.5)
Chloride: 105 mEq/L (ref 96–112)
Potassium: 3.7 mEq/L (ref 3.5–5.3)
Sodium: 139 mEq/L (ref 135–145)

## 2012-05-29 LAB — HEMOGLOBINOPATHY EVALUATION
Hemoglobin Other: 0 %
Hgb S Quant: 25.8 % — ABNORMAL HIGH

## 2012-06-11 ENCOUNTER — Other Ambulatory Visit: Payer: Self-pay | Admitting: *Deleted

## 2012-06-11 DIAGNOSIS — D5700 Hb-SS disease with crisis, unspecified: Secondary | ICD-10-CM

## 2012-06-11 DIAGNOSIS — D571 Sickle-cell disease without crisis: Secondary | ICD-10-CM

## 2012-06-11 DIAGNOSIS — G4701 Insomnia due to medical condition: Secondary | ICD-10-CM

## 2012-06-11 MED ORDER — TEMAZEPAM 15 MG PO CAPS: 15.0000 mg | ORAL_CAPSULE | Freq: Every evening | ORAL | Status: DC | PRN

## 2012-06-11 MED ORDER — PROMETHAZINE HCL 25 MG PO TABS: 25.0000 mg | ORAL_TABLET | Freq: Three times a day (TID) | ORAL | Status: DC | PRN

## 2012-06-11 MED ORDER — HYDROMORPHONE HCL 8 MG PO TABS: 8.0000 mg | ORAL_TABLET | Freq: Four times a day (QID) | ORAL | Status: DC | PRN

## 2012-06-11 MED ORDER — HYDROMORPHONE HCL ER 16 MG PO T24A: 16.0000 mg | EXTENDED_RELEASE_TABLET | ORAL | Status: DC

## 2012-06-11 NOTE — Telephone Encounter (Signed)
Pt called requesting Dilaudid (short & long acting), Phenergan, & Restoril refills. He states he is going out of town. Last given to the pt on 05/11/12. Will route the rx to Dr Myna Hidalgo to sign then place at front desk for pick up.

## 2012-06-15 ENCOUNTER — Other Ambulatory Visit (HOSPITAL_BASED_OUTPATIENT_CLINIC_OR_DEPARTMENT_OTHER): Payer: Medicaid Other | Admitting: Lab

## 2012-06-15 ENCOUNTER — Ambulatory Visit (HOSPITAL_BASED_OUTPATIENT_CLINIC_OR_DEPARTMENT_OTHER): Payer: Medicaid Other

## 2012-06-15 ENCOUNTER — Telehealth: Payer: Self-pay | Admitting: Hematology & Oncology

## 2012-06-15 ENCOUNTER — Ambulatory Visit (HOSPITAL_BASED_OUTPATIENT_CLINIC_OR_DEPARTMENT_OTHER): Payer: Medicaid Other | Admitting: Hematology & Oncology

## 2012-06-15 VITALS — BP 112/60 | HR 68 | Temp 98.7°F | Resp 18 | Ht 64.0 in | Wt 134.0 lb

## 2012-06-15 DIAGNOSIS — D571 Sickle-cell disease without crisis: Secondary | ICD-10-CM

## 2012-06-15 DIAGNOSIS — D5700 Hb-SS disease with crisis, unspecified: Secondary | ICD-10-CM

## 2012-06-15 LAB — CBC WITH DIFFERENTIAL (CANCER CENTER ONLY)
BASO#: 0.1 10*3/uL (ref 0.0–0.2)
BASO%: 0.3 % (ref 0.0–2.0)
HCT: 25.4 % — ABNORMAL LOW (ref 38.7–49.9)
HGB: 8.7 g/dL — ABNORMAL LOW (ref 13.0–17.1)
LYMPH%: 20.9 % (ref 14.0–48.0)
MCHC: 34.3 g/dL (ref 32.0–35.9)
MCV: 91 fL (ref 82–98)
MONO#: 1.9 10*3/uL — ABNORMAL HIGH (ref 0.1–0.9)
NEUT%: 67.6 % (ref 40.0–80.0)
RDW: 18.3 % — ABNORMAL HIGH (ref 11.1–15.7)

## 2012-06-15 LAB — HOLD TUBE, BLOOD BANK - CHCC SATELLITE

## 2012-06-15 MED ORDER — HYDROMORPHONE HCL PF 4 MG/ML IJ SOLN
4.0000 mg | Freq: Once | INTRAMUSCULAR | Status: AC
  Administered 2012-06-15: 4 mg via INTRAVENOUS

## 2012-06-15 MED ORDER — SODIUM CHLORIDE 0.9 % IV SOLN
Freq: Once | INTRAVENOUS | Status: AC
  Administered 2012-06-15: 13:00:00 via INTRAVENOUS

## 2012-06-15 MED ORDER — PROMETHAZINE HCL 25 MG/ML IJ SOLN
25.0000 mg | Freq: Four times a day (QID) | INTRAMUSCULAR | Status: DC | PRN
  Administered 2012-06-15: 25 mg via INTRAVENOUS

## 2012-06-15 MED ORDER — PROMETHAZINE HCL 6.25 MG/5ML PO SYRP: 12.5000 mg | ORAL_SOLUTION | Freq: Four times a day (QID) | ORAL | Status: DC | PRN

## 2012-06-15 NOTE — Progress Notes (Signed)
This office note has been dictated.

## 2012-06-15 NOTE — Telephone Encounter (Signed)
Pt aware of 1-24 appointment

## 2012-06-16 NOTE — Progress Notes (Signed)
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 3. Iron overload.  CURRENT THERAPY: 1. Hydrea 500 mg p.o. t.i.d. 2. Exjade 1500 mg p.o. daily. 3. Coumadin 2 mg p.o. daily. 4. Folic acid 1 mg p.o. daily.  INTERIM HISTORY:  Christopher Warren comes in for his followup.  He actually looks quite good.  It is probably the best I have seen him look in a while. He is not having too much in the way of pain.  He is on Exalgo and Dilaudid.  This seems to be holding him fairly well.  He did have a bump in his ferritin.  On July 13 his ferritin was 4581. This I find very unusual as 4 days prior to this it was 3400.  We are rechecking this.  I did tell him that we probably could increase his Exjade up to 1500 mg a day.  He had a good Christmas.  He has had no cough or shortness of breath. He has had no leg swelling.  He has had no headache.  When he does come in we do give him some IV fluids as this does seem to help him out.  PHYSICAL EXAMINATION:  General:  This is a well-developed, well- nourished black gentleman in no obvious distress.  Vital signs:  Show temperature of 98.7, pulse 68, respiratory rate 18, blood pressure 112/60.  Weight is 134.  Head and neck:  Shows a normocephalic, atraumatic skull.  There are no ocular or lesions.  There are no palpable cervical or supraclavicular lymph nodes.  Lungs:  Clear bilaterally.  Cardiac:  Regular.  Regular rate and rhythm with a normal S1, S2.  There are no murmurs, rubs or bruits.  Abdomen:  Soft.  He has well-healed splenectomy scar.  There is no fluid wave.  There is no palpable hepatomegaly.  Back:  No tenderness of the spine, ribs or hips. Extremities:  Show no clubbing, cyanosis or edema.  LABORATORY STUDIES:  White cell count 17.8, hemoglobin 8.7, hematocrit 25.4, platelet count 558.  IMPRESSION:  Christopher Warren is a 37 year old black gentleman with hemoglobin SS disease.  This has been doing okay.   I think once we got his spleen out this has helped quite a bit.  We will see what his iron levels are.  Again, I do not see that increasing him up to 1500 mg a day will be a problem.  We will have him come back in 3 more weeks so that we can re-evaluate this.    ______________________________ [name], M.D. PRE/MEDQ  D:  06/16/2012  T:  06/16/2012  Job:  [id number]

## 2012-06-19 ENCOUNTER — Ambulatory Visit: Payer: Medicaid Other | Admitting: Hematology & Oncology

## 2012-06-19 ENCOUNTER — Other Ambulatory Visit: Payer: Medicaid Other | Admitting: Lab

## 2012-06-19 ENCOUNTER — Encounter (HOSPITAL_COMMUNITY)
Admission: RE | Admit: 2012-06-19 | Discharge: 2012-06-19 | Disposition: A | Discharge status: Home / Self Care | Payer: Medicaid Other | Source: Ambulatory Visit | Attending: Hematology & Oncology | Admitting: Hematology & Oncology

## 2012-06-19 ENCOUNTER — Ambulatory Visit: Payer: Medicaid Other

## 2012-06-19 DIAGNOSIS — D571 Sickle-cell disease without crisis: Secondary | ICD-10-CM | POA: Insufficient documentation

## 2012-06-19 LAB — FERRITIN: Ferritin: 4026 ng/mL — ABNORMAL HIGH (ref 22–322)

## 2012-06-19 LAB — HEMOGLOBINOPATHY EVALUATION
Hemoglobin Other: 0 %
Hgb A2 Quant: 2.6 % (ref 2.2–3.2)
Hgb A: 53.1 % — ABNORMAL LOW (ref 96.8–97.8)

## 2012-06-19 LAB — RETICULOCYTES (CHCC): ABS Retic: 236.5 10*3/uL — ABNORMAL HIGH (ref 19.0–186.0)

## 2012-06-19 LAB — IRON AND TIBC
%SAT: 94 % — ABNORMAL HIGH (ref 20–55)
TIBC: 266 ug/dL (ref 215–435)

## 2012-06-26 ENCOUNTER — Other Ambulatory Visit: Payer: Self-pay | Admitting: *Deleted

## 2012-06-26 DIAGNOSIS — D571 Sickle-cell disease without crisis: Secondary | ICD-10-CM

## 2012-06-27 ENCOUNTER — Other Ambulatory Visit (HOSPITAL_BASED_OUTPATIENT_CLINIC_OR_DEPARTMENT_OTHER): Payer: Medicaid Other | Admitting: Lab

## 2012-06-27 ENCOUNTER — Ambulatory Visit (HOSPITAL_BASED_OUTPATIENT_CLINIC_OR_DEPARTMENT_OTHER)
Admission: RE | Admit: 2012-06-27 | Discharge: 2012-06-27 | Disposition: A | Discharge status: Home / Self Care | Payer: Medicaid Other | Source: Ambulatory Visit | Attending: Hematology & Oncology | Admitting: Hematology & Oncology

## 2012-06-27 ENCOUNTER — Other Ambulatory Visit: Payer: Self-pay | Admitting: Hematology & Oncology

## 2012-06-27 ENCOUNTER — Ambulatory Visit (HOSPITAL_BASED_OUTPATIENT_CLINIC_OR_DEPARTMENT_OTHER): Payer: Medicaid Other

## 2012-06-27 ENCOUNTER — Other Ambulatory Visit: Payer: Self-pay | Admitting: *Deleted

## 2012-06-27 VITALS — BP 117/51 | HR 84 | Temp 98.5°F | Resp 20

## 2012-06-27 DIAGNOSIS — D571 Sickle-cell disease without crisis: Secondary | ICD-10-CM

## 2012-06-27 DIAGNOSIS — M25579 Pain in unspecified ankle and joints of unspecified foot: Secondary | ICD-10-CM | POA: Insufficient documentation

## 2012-06-27 DIAGNOSIS — D5700 Hb-SS disease with crisis, unspecified: Secondary | ICD-10-CM

## 2012-06-27 LAB — CBC WITH DIFFERENTIAL (CANCER CENTER ONLY)
BASO%: 0.7 % (ref 0.0–2.0)
Eosinophils Absolute: 0.5 10*3/uL (ref 0.0–0.5)
LYMPH#: 3.4 10*3/uL — ABNORMAL HIGH (ref 0.9–3.3)
LYMPH%: 17.5 % (ref 14.0–48.0)
MCV: 90 fL (ref 82–98)
MONO#: 1.9 10*3/uL — ABNORMAL HIGH (ref 0.1–0.9)
NEUT#: 13.5 10*3/uL — ABNORMAL HIGH (ref 1.5–6.5)
Platelets: 473 10*3/uL — ABNORMAL HIGH (ref 145–400)
RBC: 2.66 10*6/uL — ABNORMAL LOW (ref 4.20–5.70)
WBC: 19.4 10*3/uL — ABNORMAL HIGH (ref 4.0–10.0)

## 2012-06-27 LAB — HOLD TUBE, BLOOD BANK - CHCC SATELLITE

## 2012-06-27 MED ORDER — PROMETHAZINE HCL 25 MG/ML IJ SOLN
25.0000 mg | Freq: Once | INTRAMUSCULAR | Status: AC
  Administered 2012-06-27: 25 mg via INTRAVENOUS

## 2012-06-27 MED ORDER — SODIUM CHLORIDE 0.9 % IV SOLN
Freq: Once | INTRAVENOUS | Status: AC
  Administered 2012-06-27: 11:00:00 via INTRAVENOUS

## 2012-06-27 MED ORDER — HYDROMORPHONE HCL PF 2 MG/ML IJ SOLN
4.0000 mg | Freq: Once | INTRAMUSCULAR | Status: AC
  Administered 2012-06-27: 4 mg via INTRAVENOUS

## 2012-06-27 MED ORDER — SODIUM CHLORIDE 0.9 % IJ SOLN
10.0000 mL | INTRAMUSCULAR | Status: DC | PRN
  Administered 2012-06-27: 10 mL via INTRAVENOUS
  Filled 2012-06-27: qty 10

## 2012-06-27 MED ORDER — HEPARIN SOD (PORK) LOCK FLUSH 100 UNIT/ML IV SOLN
500.0000 [IU] | Freq: Once | INTRAVENOUS | Status: AC
  Administered 2012-06-27: 500 [IU] via INTRAVENOUS
  Filled 2012-06-27: qty 5

## 2012-06-27 NOTE — Progress Notes (Signed)
Pt here for CBC check, IVF, and pain medications. Hgb 8.6 no transfusion required per Dr Myna Hidalgo. Placed order for Dilaudid 4 mg and Phenergan 25 mg IV per Dr Myna Hidalgo for sickle cell pain. He already has IVF 0.9% NS running.

## 2012-06-27 NOTE — Patient Instructions (Signed)
Pain Medicine Instructions Pain medicines may change the way you think. You might not be able to do some things safely. Take medicine as told by your doctor for pain. Do not take the medicine if you do not have pain, unless your doctor tells you to. You can take less medicine if a smaller amount makes your pain go away. It may not be possible to make all of your pain go away. However, you should be comfortable enough to move, breathe, and take care of yourself. After you start the medicine, and for 8 hours after you stop, do not:  Drive.  Use machines.  Use power tools.  Sign legal papers.  Take care of children by yourself.  Climb or go to high places.  Swim without an adult nearby to help you. HOME CARE   Do not drink alcohol until 8 hours after you stop taking pain medicines.  Do not take sleeping pills until 8 hours after you stop taking pain medicines.  Do not take other medicines until 8 hours after you stop taking pain medicines, unless your doctor says it is okay.  Take a medicine to help you go poop (stool softener) if you cannot (constipated). Eating more fruits and vegetables can also help you poop.  Write down when you take your medicine. You may get confused when taking pain medicine. Writing down the times will help you to not take too much medicine (overdose). GET HELP RIGHT AWAY IF:   You feel dizzy or pass out (faint).  You feel there are other problems that might be caused by your medicine.  Your medicine is not helping the pain go away.  You throw up (vomit) or have watery poop (diarrhea).  You have pain in an area that did not hurt before. MAKE SURE YOU:   Understand these instructions.  Will watch your condition.  Will get help right away if you are not doing well or get worse. Document Released: 11/16/2007 Document Revised: 08/22/2011 Document Reviewed: 05/14/2010 [facility] Patient Information 2013 [place], [place].

## 2012-06-28 ENCOUNTER — Telehealth: Payer: Self-pay | Admitting: *Deleted

## 2012-06-28 NOTE — Telephone Encounter (Signed)
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  Probably just an ankle sprain per dr. Myna Hidalgo

## 2012-06-28 NOTE — Telephone Encounter (Signed)
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  Probably an ankle sprain.  Cindee Lame

## 2012-07-03 ENCOUNTER — Other Ambulatory Visit: Payer: Self-pay | Admitting: *Deleted

## 2012-07-03 ENCOUNTER — Ambulatory Visit (HOSPITAL_BASED_OUTPATIENT_CLINIC_OR_DEPARTMENT_OTHER): Payer: Medicaid Other

## 2012-07-03 ENCOUNTER — Other Ambulatory Visit (HOSPITAL_BASED_OUTPATIENT_CLINIC_OR_DEPARTMENT_OTHER): Payer: Medicaid Other | Admitting: Lab

## 2012-07-03 DIAGNOSIS — D5700 Hb-SS disease with crisis, unspecified: Secondary | ICD-10-CM

## 2012-07-03 DIAGNOSIS — D571 Sickle-cell disease without crisis: Secondary | ICD-10-CM

## 2012-07-03 DIAGNOSIS — E860 Dehydration: Secondary | ICD-10-CM

## 2012-07-03 LAB — CBC WITH DIFFERENTIAL (CANCER CENTER ONLY)
BASO%: 0.6 % (ref 0.0–2.0)
Eosinophils Absolute: 0.6 10*3/uL — ABNORMAL HIGH (ref 0.0–0.5)
LYMPH%: 25.6 % (ref 14.0–48.0)
MONO#: 1.7 10*3/uL — ABNORMAL HIGH (ref 0.1–0.9)
MONO%: 8.5 % (ref 0.0–13.0)
NEUT#: 12.4 10*3/uL — ABNORMAL HIGH (ref 1.5–6.5)
Platelets: 458 10*3/uL — ABNORMAL HIGH (ref 145–400)
RBC: 2.52 10*6/uL — ABNORMAL LOW (ref 4.20–5.70)
RDW: 18.8 % — ABNORMAL HIGH (ref 11.1–15.7)
WBC: 19.8 10*3/uL — ABNORMAL HIGH (ref 4.0–10.0)

## 2012-07-03 LAB — HOLD TUBE, BLOOD BANK - CHCC SATELLITE

## 2012-07-03 MED ORDER — HYDROMORPHONE HCL PF 2 MG/ML IJ SOLN: 2.0000 mg | Freq: Once | INTRAMUSCULAR | Status: DC

## 2012-07-03 MED ORDER — SODIUM CHLORIDE 0.9 % IV SOLN: Freq: Once | INTRAVENOUS | Status: DC

## 2012-07-03 MED ORDER — HYDROMORPHONE HCL PF 4 MG/ML IJ SOLN
4.0000 mg | Freq: Once | INTRAMUSCULAR | Status: AC
  Administered 2012-07-03: 4 mg via INTRAVENOUS

## 2012-07-03 MED ORDER — DIPHENHYDRAMINE HCL 50 MG/ML IJ SOLN
25.0000 mg | Freq: Once | INTRAMUSCULAR | Status: AC
  Administered 2012-07-03: 25 mg via INTRAVENOUS

## 2012-07-03 NOTE — Patient Instructions (Addendum)
Dehydration, Adult Dehydration is when you lose more fluids from the body than you take in. Vital organs like the kidneys, brain, and heart cannot function without a proper amount of fluids and salt. Any loss of fluids from the body can cause dehydration.  CAUSES   Vomiting.  Diarrhea.  Excessive sweating.  Excessive urine output.  Fever. SYMPTOMS  Mild dehydration  Thirst.  Dry lips.  Slightly dry mouth. Moderate dehydration  Very dry mouth.  Sunken eyes.  Skin does not bounce back quickly when lightly pinched and released.  Dark urine and decreased urine production.  Decreased tear production.  Headache. Severe dehydration  Very dry mouth.  Extreme thirst.  Rapid, weak pulse (more than 100 beats per minute at rest).  Cold hands and feet.  Not able to sweat in spite of heat and temperature.  Rapid breathing.  Blue lips.  Confusion and lethargy.  Difficulty being awakened.  Minimal urine production.  No tears. DIAGNOSIS  Your caregiver will diagnose dehydration based on your symptoms and your exam. Blood and urine tests will help confirm the diagnosis. The diagnostic evaluation should also identify the cause of dehydration. TREATMENT  Treatment of mild or moderate dehydration can often be done at home by increasing the amount of fluids that you drink. It is best to drink small amounts of fluid more often. Drinking too much at one time can make vomiting worse. Refer to the home care instructions below. Severe dehydration needs to be treated at the hospital where you will probably be given intravenous (IV) fluids that contain water and electrolytes. HOME CARE INSTRUCTIONS   Ask your caregiver about specific rehydration instructions.  Drink enough fluids to keep your urine clear or pale yellow.  Drink small amounts frequently if you have nausea and vomiting.  Eat as you normally do.  Avoid:  Foods or drinks high in sugar.  Carbonated  drinks.  Juice.  Extremely hot or cold fluids.  Drinks with caffeine.  Fatty, greasy foods.  Alcohol.  Tobacco.  Overeating.  Gelatin desserts.  Wash your hands well to avoid spreading bacteria and viruses.  Only take over-the-counter or prescription medicines for pain, discomfort, or fever as directed by your caregiver.  Ask your caregiver if you should continue all prescribed and over-the-counter medicines.  Keep all follow-up appointments with your caregiver. SEEK MEDICAL CARE IF:  You have abdominal pain and it increases or stays in one area (localizes).  You have a rash, stiff neck, or severe headache.  You are irritable, sleepy, or difficult to awaken.  You are weak, dizzy, or extremely thirsty. SEEK IMMEDIATE MEDICAL CARE IF:   You are unable to keep fluids down or you get worse despite treatment.  You have frequent episodes of vomiting or diarrhea.  You have blood or green matter (bile) in your vomit.  You have blood in your stool or your stool looks black and tarry.  You have not urinated in 6 to 8 hours, or you have only urinated a small amount of very dark urine.  You have a fever.  You faint. MAKE SURE YOU:   Understand these instructions.  Will watch your condition.  Will get help right away if you are not doing well or get worse. Document Released: 05/30/2005 Document Revised: 08/22/2011 Document Reviewed: 01/17/2011 [employer] Patient Information 2013 [employer].  

## 2012-07-03 NOTE — Progress Notes (Signed)
Pt called early this morning stating he was having pain and needed to come in for IVF and pain medication. Reviewed with Dr Myna Hidalgo. To check CBC and give 1 liter of NSS over 2 hours along with Dilaudid 4 mg and Phenergan 25 mg IV. CBC showed Hgb 8.0. Reviewed sx with pt & Eunice Blase, PA in Dr Gustavo Lah absence. He is to be planned for phlebotomy x 1 with 2 units of RBSc on Friday 07/07/11 after his f/u visit with Dr Myna Hidalgo.  Approximately 1 hour after receiving his pain medication IV, he asked the infusion nurses for more pain medication. Order was obtained from [place], [place] for Dilaudid 2 mg IV. Confirmed that he was NOT driving himself home.  He understands not to remove his blue blood band bracelet.

## 2012-07-03 NOTE — Progress Notes (Signed)
Pt called stating he was having pain

## 2012-07-06 ENCOUNTER — Ambulatory Visit (HOSPITAL_BASED_OUTPATIENT_CLINIC_OR_DEPARTMENT_OTHER): Payer: Medicaid Other | Admitting: Hematology & Oncology

## 2012-07-06 ENCOUNTER — Ambulatory Visit (HOSPITAL_BASED_OUTPATIENT_CLINIC_OR_DEPARTMENT_OTHER): Payer: Medicaid Other

## 2012-07-06 ENCOUNTER — Other Ambulatory Visit: Payer: Medicaid Other | Admitting: Lab

## 2012-07-06 VITALS — BP 113/45 | HR 76 | Temp 98.6°F | Resp 18 | Ht 64.0 in | Wt 135.0 lb

## 2012-07-06 DIAGNOSIS — D5700 Hb-SS disease with crisis, unspecified: Secondary | ICD-10-CM

## 2012-07-06 DIAGNOSIS — J329 Chronic sinusitis, unspecified: Secondary | ICD-10-CM

## 2012-07-06 DIAGNOSIS — D571 Sickle-cell disease without crisis: Secondary | ICD-10-CM

## 2012-07-06 DIAGNOSIS — J069 Acute upper respiratory infection, unspecified: Secondary | ICD-10-CM

## 2012-07-06 LAB — CBC WITH DIFFERENTIAL (CANCER CENTER ONLY)
BASO#: 0.2 10*3/uL (ref 0.0–0.2)
EOS%: 2.5 % (ref 0.0–7.0)
Eosinophils Absolute: 0.6 10*3/uL — ABNORMAL HIGH (ref 0.0–0.5)
LYMPH%: 21.3 % (ref 14.0–48.0)
MCH: 32.2 pg (ref 28.0–33.4)
MCHC: 35.3 g/dL (ref 32.0–35.9)
MCV: 91 fL (ref 82–98)
MONO%: 9.9 % (ref 0.0–13.0)
NEUT#: 16.7 10*3/uL — ABNORMAL HIGH (ref 1.5–6.5)
Platelets: 557 10*3/uL — ABNORMAL HIGH (ref 145–400)
RBC: 2.58 10*6/uL — ABNORMAL LOW (ref 4.20–5.70)

## 2012-07-06 LAB — TECHNOLOGIST REVIEW CHCC SATELLITE: Tech Review: 1

## 2012-07-06 MED ORDER — SODIUM CHLORIDE 0.9 % IV SOLN
250.0000 mL | Freq: Once | INTRAVENOUS | Status: AC
  Administered 2012-07-06: 250 mL via INTRAVENOUS

## 2012-07-06 MED ORDER — HEPARIN SOD (PORK) LOCK FLUSH 100 UNIT/ML IV SOLN
500.0000 [IU] | Freq: Every day | INTRAVENOUS | Status: AC | PRN
  Administered 2012-07-06: 500 [IU]
  Filled 2012-07-06: qty 5

## 2012-07-06 MED ORDER — DEXTROSE 5 % IV SOLN
2.0000 g | Freq: Once | INTRAVENOUS | Status: AC
  Administered 2012-07-06: 2 g via INTRAVENOUS
  Filled 2012-07-06: qty 2

## 2012-07-06 MED ORDER — SODIUM CHLORIDE 0.9 % IJ SOLN
10.0000 mL | INTRAMUSCULAR | Status: AC | PRN
  Administered 2012-07-06: 10 mL
  Filled 2012-07-06: qty 10

## 2012-07-06 MED ORDER — HYDROMORPHONE HCL PF 4 MG/ML IJ SOLN
4.0000 mg | Freq: Once | INTRAMUSCULAR | Status: AC
  Administered 2012-07-06: 4 mg via INTRAVENOUS

## 2012-07-06 MED ORDER — DIPHENHYDRAMINE HCL 50 MG/ML IJ SOLN: 25.0000 mg | Freq: Once | INTRAMUSCULAR | Status: DC

## 2012-07-06 MED ORDER — CEFDINIR 300 MG PO CAPS: 300.0000 mg | ORAL_CAPSULE | Freq: Two times a day (BID) | ORAL | Status: DC

## 2012-07-06 MED ORDER — HYDROMORPHONE HCL PF 4 MG/ML IJ SOLN
2.0000 mg | Freq: Once | INTRAMUSCULAR | Status: AC
  Administered 2012-07-06: 2 mg via INTRAVENOUS

## 2012-07-06 MED ORDER — ACETAMINOPHEN 325 MG PO TABS
650.0000 mg | ORAL_TABLET | Freq: Once | ORAL | Status: AC
  Administered 2012-07-06: 650 mg via ORAL

## 2012-07-06 MED ORDER — DIPHENHYDRAMINE HCL 50 MG/ML IJ SOLN
25.0000 mg | Freq: Once | INTRAMUSCULAR | Status: AC
  Administered 2012-07-06: 25 mg via INTRAVENOUS

## 2012-07-06 MED ORDER — HYDROMORPHONE HCL PF 4 MG/ML IJ SOLN
6.0000 mg | Freq: Once | INTRAMUSCULAR | Status: AC
  Administered 2012-07-06: 6 mg via INTRAVENOUS

## 2012-07-06 NOTE — Progress Notes (Signed)
This office note has been dictated.

## 2012-07-06 NOTE — Progress Notes (Signed)
Christopher Warren presents today for phlebotomy per MD orders. Phlebotomy procedure started at 1010 and ended at 1025. 500 ml removed. Patient observed for 30 minutes after procedure without any incident. Patient tolerated procedure well. IV needle removed intact.

## 2012-07-07 LAB — TYPE AND SCREEN
ABO/RH(D): O POS
Antibody Screen: NEGATIVE
Unit division: 0

## 2012-07-07 NOTE — Progress Notes (Signed)
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  CURRENT THERAPY: 1. Hydrea 500 mg p.o. t.i.d. 2. Exjade 1500 mg p.o. daily. 3. Folic acid 1 mg p.o. daily. 4. Coumadin 2 mg p.o. daily.  INTERIM HISTORY:  Mr. Payson comes in for followup.  Unfortunately, he is having some issues.  It looks like he has an upper respiratory infection.  He is coughing up some yellowish mucus.  He has some facial pain.  The fact that he has had his spleen out is troublesome.  We are going to have to be aggressive with this.  I will give him a dose of Rocephin in the office.  Will are going to put him on some Omnicef for 10 days.  He is being diligent with his medications.  His last ferritin was 4000. Hopefully, this will continue to come down.  He does have some increase in bony pain.  He is probably having a little bit of a crisis secondary to the infection.  We will go ahead and give him an exchange transfusion today.  He has had no obvious fever.  There has been no bleeding.  PHYSICAL EXAMINATION:  General:  This is a well-developed, well- nourished black gentleman in no obvious distress.  Vital signs: Temperature of 98.6, pulse 76, respiratory rate 18, blood pressure 115/45.  Weight is 135.  Head and neck:  Normocephalic, atraumatic skull.  There are no ocular or oral lesions.  There are no palpable cervical or supraclavicular lymph nodes.  Lungs:  Clear bilaterally.  He does have some crackles over on the right side.  Cardiac:  Regular rate and rhythm with a normal S1, S2.  There are no murmurs, rubs, or bruits. Abdomen:  Soft with good bowel sounds.  There is no palpable abdominal mass.  There is no fluid wave.  There is no palpable hepatomegaly. Splenectomy scar is well healed.  Extremities:  Some slight tenderness over the long bones.  No edema is noted in his legs.  Skin:  No rashes, ecchymosis, or  petechia.  Neurological:  No focal neurological deficits.  LABORATORY STUDIES:  Show a white cell count of 25.4, hemoglobin 8.3, hematocrit 23.5, platelet count 557.  IMPRESSION:  Mr. Chiarelli is a 37 year old gentleman with hemoglobin SS disease.  He now has a little bit of a crisis.  We will do an exchange transfusion on him today.  Hopefully, this will not affect his iron too much.  I think as long as he is taking the Exjade as he is, his iron should be relatively stable.  Again, with him having a splenectomy, this is a problem and we need to be aggressive so that this an infection does not develop into some type of sepsis.  We will, again, exchange transfuse him today.  We will go ahead and plan for followup in another 3-4 weeks.    ______________________________ [name], M.D. PRE/MEDQ  D:  07/06/2012  T:  07/07/2012  Job:  [id number]

## 2012-07-09 ENCOUNTER — Other Ambulatory Visit: Payer: Self-pay | Admitting: *Deleted

## 2012-07-09 DIAGNOSIS — D5700 Hb-SS disease with crisis, unspecified: Secondary | ICD-10-CM

## 2012-07-09 DIAGNOSIS — D571 Sickle-cell disease without crisis: Secondary | ICD-10-CM

## 2012-07-09 MED ORDER — DEFERASIROX 500 MG PO TBSO: 1500.0000 mg | ORAL_TABLET | Freq: Every day | ORAL | Status: DC

## 2012-07-09 MED ORDER — HYDROMORPHONE HCL ER 16 MG PO T24A: 16.0000 mg | EXTENDED_RELEASE_TABLET | ORAL | Status: DC

## 2012-07-09 MED ORDER — HYDROMORPHONE HCL 8 MG PO TABS: 8.0000 mg | ORAL_TABLET | Freq: Four times a day (QID) | ORAL | Status: DC | PRN

## 2012-07-09 NOTE — Telephone Encounter (Signed)
Rx for Exjade 500mg  sent via e-rx to [facility] for TID instead of BID. Dr. Myna Hidalgo has increased his dose.

## 2012-07-09 NOTE — Telephone Encounter (Signed)
Pt called requesting Dilaudid (short & long acting) and Exjade refills. Last received  Dilaudid on 06/11/12. Should have Exjade refills. Will route the rx to Dr Myna Hidalgo to sign then place at front desk for pick up.

## 2012-07-17 ENCOUNTER — Encounter (HOSPITAL_COMMUNITY)
Admission: RE | Admit: 2012-07-17 | Discharge: 2012-07-17 | Disposition: A | Discharge status: Home / Self Care | Payer: Medicaid Other | Source: Ambulatory Visit | Attending: Hematology & Oncology | Admitting: Hematology & Oncology

## 2012-07-17 DIAGNOSIS — D571 Sickle-cell disease without crisis: Secondary | ICD-10-CM | POA: Insufficient documentation

## 2012-07-18 ENCOUNTER — Other Ambulatory Visit: Payer: Self-pay | Admitting: *Deleted

## 2012-07-18 DIAGNOSIS — D571 Sickle-cell disease without crisis: Secondary | ICD-10-CM

## 2012-07-18 MED ORDER — PROMETHAZINE HCL 6.25 MG/5ML PO SYRP: 12.5000 mg | ORAL_SOLUTION | Freq: Four times a day (QID) | ORAL | Status: DC | PRN

## 2012-07-18 NOTE — Telephone Encounter (Addendum)
Pt called stating "what's up with my Phenergan and Folic Acid refills"? Asked if he recalled Alvino Chapel talking to him yesterday about obtaining the Folic Acid over the counter from the pharmacy downstairs. He stated "yeah, yeah, yeah but they are going to have to order it". Confirmed that was taken care of, he agreed. He is requesting a larger amount of liquid Phenergan. He states his current dose only lasts for 6 days.  Reviewed with Dr Myna Hidalgo. Ok to increase amount. Rx sent to [facility] via e-rx.

## 2012-07-25 ENCOUNTER — Ambulatory Visit (HOSPITAL_BASED_OUTPATIENT_CLINIC_OR_DEPARTMENT_OTHER): Payer: Medicaid Other

## 2012-07-25 ENCOUNTER — Other Ambulatory Visit: Payer: Self-pay | Admitting: *Deleted

## 2012-07-25 VITALS — BP 110/47 | HR 95 | Temp 98.6°F | Resp 16

## 2012-07-25 DIAGNOSIS — D57419 Sickle-cell thalassemia with crisis, unspecified: Secondary | ICD-10-CM

## 2012-07-25 DIAGNOSIS — D571 Sickle-cell disease without crisis: Secondary | ICD-10-CM

## 2012-07-25 MED ORDER — HYDROMORPHONE HCL PF 4 MG/ML IJ SOLN
6.0000 mg | Freq: Once | INTRAMUSCULAR | Status: AC
  Administered 2012-07-25: 6 mg via INTRAVENOUS

## 2012-07-25 MED ORDER — PROMETHAZINE HCL 25 MG/ML IJ SOLN
25.0000 mg | Freq: Once | INTRAMUSCULAR | Status: AC
  Administered 2012-07-25: 25 mg via INTRAVENOUS

## 2012-07-25 MED ORDER — SODIUM CHLORIDE 0.9 % IV SOLN
Freq: Once | INTRAVENOUS | Status: AC
  Administered 2012-07-25: 12:00:00 via INTRAVENOUS

## 2012-07-25 MED ORDER — HEPARIN SOD (PORK) LOCK FLUSH 100 UNIT/ML IV SOLN
500.0000 [IU] | Freq: Once | INTRAVENOUS | Status: AC
  Administered 2012-07-25: 500 [IU] via INTRAVENOUS
  Filled 2012-07-25: qty 5

## 2012-07-25 MED ORDER — SODIUM CHLORIDE 0.9 % IJ SOLN
10.0000 mL | INTRAMUSCULAR | Status: DC | PRN
  Administered 2012-07-25: 10 mL via INTRAVENOUS
  Filled 2012-07-25: qty 10

## 2012-07-25 MED ORDER — DIPHENHYDRAMINE HCL 50 MG/ML IJ SOLN
25.0000 mg | Freq: Once | INTRAMUSCULAR | Status: AC
  Administered 2012-07-25: 25 mg via INTRAVENOUS

## 2012-07-27 ENCOUNTER — Ambulatory Visit: Payer: Medicaid Other

## 2012-07-27 ENCOUNTER — Ambulatory Visit: Payer: Medicaid Other | Admitting: Hematology & Oncology

## 2012-07-27 ENCOUNTER — Other Ambulatory Visit: Payer: Medicaid Other | Admitting: Lab

## 2012-07-28 ENCOUNTER — Other Ambulatory Visit: Payer: Self-pay

## 2012-07-31 ENCOUNTER — Telehealth: Payer: Self-pay | Admitting: Hematology & Oncology

## 2012-07-31 NOTE — Telephone Encounter (Signed)
Pt aware of 2-21 appointment.

## 2012-08-03 ENCOUNTER — Ambulatory Visit: Payer: Medicaid Other

## 2012-08-03 ENCOUNTER — Telehealth: Payer: Self-pay | Admitting: *Deleted

## 2012-08-03 ENCOUNTER — Ambulatory Visit: Payer: Medicaid Other | Admitting: Hematology & Oncology

## 2012-08-03 ENCOUNTER — Other Ambulatory Visit: Payer: Medicaid Other | Admitting: Lab

## 2012-08-03 ENCOUNTER — Telehealth: Payer: Self-pay | Admitting: Hematology & Oncology

## 2012-08-03 NOTE — Telephone Encounter (Signed)
Pt called to cancel today's appointment. Per Dr. Myna Hidalgo offered to schedule him to see PA. I offered him 3-7 but he said he needed his prescriptions before then. I transferred him to RN voice mail.

## 2012-08-07 ENCOUNTER — Other Ambulatory Visit: Payer: Self-pay | Admitting: *Deleted

## 2012-08-07 DIAGNOSIS — D571 Sickle-cell disease without crisis: Secondary | ICD-10-CM

## 2012-08-08 ENCOUNTER — Ambulatory Visit (HOSPITAL_BASED_OUTPATIENT_CLINIC_OR_DEPARTMENT_OTHER): Payer: Medicaid Other

## 2012-08-08 ENCOUNTER — Ambulatory Visit (HOSPITAL_BASED_OUTPATIENT_CLINIC_OR_DEPARTMENT_OTHER): Payer: Medicaid Other | Admitting: Medical

## 2012-08-08 ENCOUNTER — Encounter: Payer: Self-pay | Admitting: *Deleted

## 2012-08-08 ENCOUNTER — Other Ambulatory Visit: Payer: Self-pay | Admitting: Medical

## 2012-08-08 ENCOUNTER — Other Ambulatory Visit (HOSPITAL_BASED_OUTPATIENT_CLINIC_OR_DEPARTMENT_OTHER): Payer: Medicaid Other | Admitting: Lab

## 2012-08-08 VITALS — BP 122/47 | HR 63 | Temp 98.6°F | Resp 18 | Ht 64.0 in | Wt 127.0 lb

## 2012-08-08 VITALS — BP 108/50 | HR 84 | Temp 98.1°F | Resp 18

## 2012-08-08 DIAGNOSIS — D571 Sickle-cell disease without crisis: Secondary | ICD-10-CM

## 2012-08-08 DIAGNOSIS — D5700 Hb-SS disease with crisis, unspecified: Secondary | ICD-10-CM

## 2012-08-08 DIAGNOSIS — R110 Nausea: Secondary | ICD-10-CM

## 2012-08-08 DIAGNOSIS — J329 Chronic sinusitis, unspecified: Secondary | ICD-10-CM

## 2012-08-08 LAB — CBC WITH DIFFERENTIAL (CANCER CENTER ONLY)
BASO%: 0.4 % (ref 0.0–2.0)
EOS%: 1.2 % (ref 0.0–7.0)
LYMPH#: 3.8 10*3/uL — ABNORMAL HIGH (ref 0.9–3.3)
MONO#: 2.3 10*3/uL — ABNORMAL HIGH (ref 0.1–0.9)
Platelets: 510 10*3/uL — ABNORMAL HIGH (ref 145–400)
RDW: 19.6 % — ABNORMAL HIGH (ref 11.1–15.7)
WBC: 24 10*3/uL — ABNORMAL HIGH (ref 4.0–10.0)

## 2012-08-08 MED ORDER — HYDROMORPHONE HCL ER 16 MG PO T24A: 16.0000 mg | EXTENDED_RELEASE_TABLET | ORAL | Status: DC

## 2012-08-08 MED ORDER — HYDROMORPHONE HCL 8 MG PO TABS: 8.0000 mg | ORAL_TABLET | Freq: Four times a day (QID) | ORAL | Status: DC | PRN

## 2012-08-08 MED ORDER — SODIUM CHLORIDE 0.9 % IJ SOLN
10.0000 mL | INTRAMUSCULAR | Status: DC | PRN
  Administered 2012-08-08: 10 mL via INTRAVENOUS
  Filled 2012-08-08: qty 10

## 2012-08-08 MED ORDER — SODIUM CHLORIDE 0.9 % IV SOLN
1000.0000 mL | Freq: Once | INTRAVENOUS | Status: DC
  Administered 2012-08-08: 1000 mL via INTRAVENOUS

## 2012-08-08 MED ORDER — HEPARIN SOD (PORK) LOCK FLUSH 100 UNIT/ML IV SOLN
500.0000 [IU] | Freq: Once | INTRAVENOUS | Status: AC
  Administered 2012-08-08: 500 [IU] via INTRAVENOUS
  Filled 2012-08-08: qty 5

## 2012-08-08 MED ORDER — DIPHENHYDRAMINE HCL 50 MG/ML IJ SOLN
25.0000 mg | Freq: Once | INTRAMUSCULAR | Status: AC
  Administered 2012-08-08: 25 mg via INTRAVENOUS

## 2012-08-08 MED ORDER — ONDANSETRON 8 MG/50ML IVPB (CHCC)
8.0000 mg | Freq: Once | INTRAVENOUS | Status: AC
  Administered 2012-08-08: 8 mg via INTRAVENOUS

## 2012-08-08 MED ORDER — PROMETHAZINE HCL 6.25 MG/5ML PO SYRP: 12.5000 mg | ORAL_SOLUTION | Freq: Four times a day (QID) | ORAL | Status: DC | PRN

## 2012-08-08 MED ORDER — HYDROMORPHONE HCL PF 2 MG/ML IJ SOLN
2.0000 mg | Freq: Once | INTRAMUSCULAR | Status: AC
  Administered 2012-08-08: 2 mg via INTRAVENOUS

## 2012-08-08 NOTE — Patient Instructions (Signed)
Dehydration, Adult Dehydration is when you lose more fluids from the body than you take in. Vital organs like the kidneys, brain, and heart cannot function without a proper amount of fluids and salt. Any loss of fluids from the body can cause dehydration.  CAUSES   Vomiting.  Diarrhea.  Excessive sweating.  Excessive urine output.  Fever. SYMPTOMS  Mild dehydration  Thirst.  Dry lips.  Slightly dry mouth. Moderate dehydration  Very dry mouth.  Sunken eyes.  Skin does not bounce back quickly when lightly pinched and released.  Dark urine and decreased urine production.  Decreased tear production.  Headache. Severe dehydration  Very dry mouth.  Extreme thirst.  Rapid, weak pulse (more than 100 beats per minute at rest).  Cold hands and feet.  Not able to sweat in spite of heat and temperature.  Rapid breathing.  Blue lips.  Confusion and lethargy.  Difficulty being awakened.  Minimal urine production.  No tears. DIAGNOSIS  Your caregiver will diagnose dehydration based on your symptoms and your exam. Blood and urine tests will help confirm the diagnosis. The diagnostic evaluation should also identify the cause of dehydration. TREATMENT  Treatment of mild or moderate dehydration can often be done at home by increasing the amount of fluids that you drink. It is best to drink small amounts of fluid more often. Drinking too much at one time can make vomiting worse. Refer to the home care instructions below. Severe dehydration needs to be treated at the hospital where you will probably be given intravenous (IV) fluids that contain water and electrolytes. HOME CARE INSTRUCTIONS   Ask your caregiver about specific rehydration instructions.  Drink enough fluids to keep your urine clear or pale yellow.  Drink small amounts frequently if you have nausea and vomiting.  Eat as you normally do.  Avoid:  Foods or drinks high in sugar.  Carbonated  drinks.  Juice.  Extremely hot or cold fluids.  Drinks with caffeine.  Fatty, greasy foods.  Alcohol.  Tobacco.  Overeating.  Gelatin desserts.  Wash your hands well to avoid spreading bacteria and viruses.  Only take over-the-counter or prescription medicines for pain, discomfort, or fever as directed by your caregiver.  Ask your caregiver if you should continue all prescribed and over-the-counter medicines.  Keep all follow-up appointments with your caregiver. SEEK MEDICAL CARE IF:  You have abdominal pain and it increases or stays in one area (localizes).  You have a rash, stiff neck, or severe headache.  You are irritable, sleepy, or difficult to awaken.  You are weak, dizzy, or extremely thirsty. SEEK IMMEDIATE MEDICAL CARE IF:   You are unable to keep fluids down or you get worse despite treatment.  You have frequent episodes of vomiting or diarrhea.  You have blood or green matter (bile) in your vomit.  You have blood in your stool or your stool looks black and tarry.  You have not urinated in 6 to 8 hours, or you have only urinated a small amount of very dark urine.  You have a fever.  You faint. MAKE SURE YOU:   Understand these instructions.  Will watch your condition.  Will get help right away if you are not doing well or get worse. Document Released: 05/30/2005 Document Revised: 08/22/2011 Document Reviewed: 01/17/2011 [employer] Patient Information 2013 [employer].  

## 2012-08-08 NOTE — Addendum Note (Signed)
Addended by: Liana Crocker on: 08/08/2012 01:06 PM   Modules accepted: Orders

## 2012-08-08 NOTE — Progress Notes (Addendum)
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  CURRENT THERAPY: 1. Hydrea 500 mg p.o. t.i.d. 2. Exjade 1500 mg p.o. daily. 3. Folic acid 1 mg p.o. daily. 4. Coumadin 2 mg p.o. daily.  INTERIM HISTORY: [name] presents today for an office followup visit.  His corporate accompanies him.  Overall, he, reports, that he is holding stable.  He does report some fatigue, as well as dehydration.  He is requesting IV fluids, and IV pain medication.  He would like suspension form phenergan With Codeine.  Advised him just continue taking his regular suspension form of Phenergan.  He is requesting a refill, and on his pain medications.  His last ferritin we checked it back in early January, was 4026.  He, reports, a decent, appetite.  He denies any active, vomiting.  He denies any diarrhea, constipation, chest pain, shortness breath, cough.  He denies any fevers, chills, or night sweats.  He denies any lower leg swelling.  He denies any obvious, or abnormal bleeding or bruising.  He denies any headaches, visual changes, or rashes.  Review of Systems: Constitutional:Negative for malaise/fatigue, fever, chills, weight loss, diaphoresis, activity change, appetite change, and unexpected weight change.  HEENT: Negative for double vision, blurred vision, visual loss, ear pain, tinnitus, congestion, rhinorrhea, epistaxis sore throat or sinus disease, oral pain/lesion, tongue soreness Respiratory: Negative for cough, chest tightness, shortness of breath, wheezing and stridor.  Cardiovascular: Negative for chest pain, palpitations, leg swelling, orthopnea, PND, DOE or claudication Gastrointestinal: Negative for nausea, vomiting, abdominal pain, diarrhea, constipation, blood in stool, melena, hematochezia, abdominal distention, anal bleeding, rectal pain, anorexia and hematemesis.  Genitourinary: Negative for dysuria, frequency,  hematuria,  Musculoskeletal: Negative for myalgias, back pain, joint swelling, arthralgias and gait problem.  Skin: Negative for rash, color change, pallor and wound.  Neurological:. Negative for dizziness/light-headedness, tremors, seizures, syncope, facial asymmetry, speech difficulty, weakness, numbness, headaches and paresthesias.  Hematological: Negative for adenopathy. Does not bruise/bleed easily.  Psychiatric/Behavioral:  Negative for depression, no loss of interest in normal activity or change in sleep pattern.   Physical Exam: This is a thin-appearing, 37 year old, Afro-American gentleman, in no obvious distress Vitals: Temperature 98.6 degrees, pulse 63, respirations 16, blood pressure 120/47, weight 127 pounds HEENT reveals a normocephalic, atraumatic skull, no scleral icterus, no oral lesions  Neck is supple without any cervical or supraclavicular adenopathy.  Lungs are clear to auscultation bilaterally. There are no wheezes, rales or rhonci Cardiac is regular rate and rhythm with a normal S1 and S2. There are no murmurs, rubs, or bruits.  Abdomen is soft with good bowel sounds, there is no palpable mass. There is no palpable hepatosplenomegaly. There is no palpable fluid wave.  Musculoskeletal no tenderness of the spine, ribs, or hips.  Extremities there are no clubbing, cyanosis, or edema.  Skin no petechia, purpura or ecchymosis Neurologic is nonfocal.  LABORATORY STUDIES:   White count 24.0, hemoglobin 8.5, hematocrit 23.8, platelets 510,000  IMPRESSION: This is a 37 year old, American gentleman, with the following issues.  #1.Hemoglobin, SS.  Disease.  Periodically.  We do have to do an exchange transfusion on him.  We will just give him IV fluids today.  As long as he, continues to be compliant and take his Exjade, his iron should be relatively stable.  We will still have to continue to monitor his iron panel closely.  #2.Substance abuse contract.  We do prescribe  Dilaudid Christopher Warren.  There has been  quite a few incidences where he has requested his narcotic pain medication prior to the date he can be refilled.  We will go ahead and have him sign our substance abuse contract.  Within this contract, we also state, that there will be periodic drug testing.  This will all be reviewed with Christopher Warren today by one of our nurses.   #3.  Intermittent nausea.  This is most likely secondary to his medications.  He will continue on his suspension form.  Phenergan.  #4.  Followup.  We will follow back up with Christopher Warren in 4 weeks, but before then should there be questions or concerns.  Addendum: Christopher Warren was due for his long and short-acting Dilaudid along with his suspension form of Phenergan.  He went downstairs to our  pharmacy to have these refilled.  We received a call from the pharmacy stating, that the RX for the  Phenergan suspension form was a "red flag", secondary to what is being sold on the street.  The pharmacy reported that they're now mixing suspension form phenergan with pain medication and an antihistamine to achieve a longer high.  Of note, Christopher Warren did also ask for Benadryl.  We advised the pharmacy not to fill the suspension form Phenergan and instead give him a Phenergan pill by mouth.

## 2012-08-16 ENCOUNTER — Telehealth: Payer: Self-pay | Admitting: *Deleted

## 2012-08-16 LAB — COMPREHENSIVE METABOLIC PANEL
AST: 41 U/L — ABNORMAL HIGH (ref 0–37)
Albumin: 4.1 g/dL (ref 3.5–5.2)
BUN: 11 mg/dL (ref 6–23)
Calcium: 9.5 mg/dL (ref 8.4–10.5)
Chloride: 108 mEq/L (ref 96–112)
Potassium: 4.4 mEq/L (ref 3.5–5.3)
Total Protein: 7.3 g/dL (ref 6.0–8.3)

## 2012-08-16 LAB — IRON AND TIBC
%SAT: 92 % — ABNORMAL HIGH (ref 20–55)
Iron: 242 ug/dL — ABNORMAL HIGH (ref 42–165)
TIBC: 262 ug/dL (ref 215–435)

## 2012-08-16 LAB — RETICULOCYTES (CHCC)
ABS Retic: 236.7 10*3/uL — ABNORMAL HIGH (ref 19.0–186.0)
RBC.: 2.63 MIL/uL — ABNORMAL LOW (ref 4.22–5.81)
Retic Ct Pct: 9 % — ABNORMAL HIGH (ref 0.4–2.3)

## 2012-08-16 LAB — LACTATE DEHYDROGENASE: LDH: 334 U/L — ABNORMAL HIGH (ref 94–250)

## 2012-08-16 LAB — HEMOGLOBINOPATHY EVALUATION: Hgb A2 Quant: 2.6 % (ref 2.2–3.2)

## 2012-08-16 LAB — DRUG SCREEN PANEL (SERUM)

## 2012-08-16 NOTE — Telephone Encounter (Signed)
Message copied by Anselm Jungling on Thu Aug 16, 2012 12:32 PM ------      Message from: [name]      Created: Sat Aug 11, 2012 12:31 PM       I called Kendell Bane and give him the ferritin value of 4600. I told him that we need to minimize transfusions on him. His hemoglobin A% level is excellent.            He is on Exjade. I told him to go up to 4 pills a day.            We need to check his ferritin in one more month. If we are not seeing a response, then we'll going to have to talk to about infusional Desferal.            Pete E. ------

## 2012-08-27 ENCOUNTER — Encounter: Payer: Self-pay | Admitting: Hematology & Oncology

## 2012-09-10 ENCOUNTER — Encounter (HOSPITAL_COMMUNITY)
Admission: RE | Admit: 2012-09-10 | Discharge: 2012-09-10 | Disposition: A | Discharge status: Home / Self Care | Payer: Medicaid Other | Source: Ambulatory Visit | Attending: Hematology & Oncology | Admitting: Hematology & Oncology

## 2012-09-10 ENCOUNTER — Ambulatory Visit (HOSPITAL_BASED_OUTPATIENT_CLINIC_OR_DEPARTMENT_OTHER): Payer: Medicaid Other | Admitting: Hematology & Oncology

## 2012-09-10 ENCOUNTER — Other Ambulatory Visit (HOSPITAL_BASED_OUTPATIENT_CLINIC_OR_DEPARTMENT_OTHER): Payer: Medicaid Other | Admitting: Lab

## 2012-09-10 ENCOUNTER — Ambulatory Visit (HOSPITAL_BASED_OUTPATIENT_CLINIC_OR_DEPARTMENT_OTHER): Payer: Medicaid Other

## 2012-09-10 VITALS — BP 116/48 | HR 90 | Temp 98.6°F | Resp 18 | Ht 64.0 in | Wt 131.0 lb

## 2012-09-10 DIAGNOSIS — G4701 Insomnia due to medical condition: Secondary | ICD-10-CM

## 2012-09-10 DIAGNOSIS — D571 Sickle-cell disease without crisis: Secondary | ICD-10-CM

## 2012-09-10 DIAGNOSIS — D5700 Hb-SS disease with crisis, unspecified: Secondary | ICD-10-CM

## 2012-09-10 LAB — CBC WITH DIFFERENTIAL (CANCER CENTER ONLY)
BASO#: 0.1 10*3/uL (ref 0.0–0.2)
Eosinophils Absolute: 0.3 10*3/uL (ref 0.0–0.5)
HGB: 8 g/dL — ABNORMAL LOW (ref 13.0–17.1)
LYMPH%: 17.8 % (ref 14.0–48.0)
MCH: 33.8 pg — ABNORMAL HIGH (ref 28.0–33.4)
MCV: 92 fL (ref 82–98)
MONO#: 1.9 10*3/uL — ABNORMAL HIGH (ref 0.1–0.9)
NEUT#: 13.3 10*3/uL — ABNORMAL HIGH (ref 1.5–6.5)
Platelets: 453 10*3/uL — ABNORMAL HIGH (ref 145–400)
RBC: 2.37 10*6/uL — ABNORMAL LOW (ref 4.20–5.70)
WBC: 18.9 10*3/uL — ABNORMAL HIGH (ref 4.0–10.0)

## 2012-09-10 LAB — HOLD TUBE, BLOOD BANK - CHCC SATELLITE

## 2012-09-10 LAB — TECHNOLOGIST REVIEW CHCC SATELLITE

## 2012-09-10 MED ORDER — HEPARIN SOD (PORK) LOCK FLUSH 100 UNIT/ML IV SOLN
500.0000 [IU] | Freq: Once | INTRAVENOUS | Status: AC
  Administered 2012-09-10: 500 [IU] via INTRAVENOUS
  Filled 2012-09-10: qty 5

## 2012-09-10 MED ORDER — SODIUM CHLORIDE 0.9 % IJ SOLN
10.0000 mL | INTRAMUSCULAR | Status: DC | PRN
  Administered 2012-09-10: 10 mL via INTRAVENOUS
  Filled 2012-09-10: qty 10

## 2012-09-10 MED ORDER — HYDROMORPHONE HCL ER 16 MG PO T24A: 16.0000 mg | EXTENDED_RELEASE_TABLET | ORAL | Status: DC

## 2012-09-10 MED ORDER — WARFARIN SODIUM 2 MG PO TABS: 2.0000 mg | ORAL_TABLET | Freq: Every day | ORAL | Status: DC

## 2012-09-10 MED ORDER — PROMETHAZINE HCL 6.25 MG/5ML PO SYRP: 12.5000 mg | ORAL_SOLUTION | Freq: Four times a day (QID) | ORAL | Status: DC | PRN

## 2012-09-10 MED ORDER — DIPHENHYDRAMINE HCL 50 MG/ML IJ SOLN
25.0000 mg | Freq: Once | INTRAMUSCULAR | Status: AC
  Administered 2012-09-10: 25 mg via INTRAVENOUS

## 2012-09-10 MED ORDER — HYDROMORPHONE HCL PF 4 MG/ML IJ SOLN
8.0000 mg | Freq: Once | INTRAMUSCULAR | Status: AC
  Administered 2012-09-10: 8 mg via INTRAVENOUS

## 2012-09-10 MED ORDER — SODIUM CHLORIDE 0.9 % IV SOLN
1000.0000 mL | INTRAVENOUS | Status: AC
  Administered 2012-09-10: 1000 mL via INTRAVENOUS

## 2012-09-10 MED ORDER — DEFERASIROX 500 MG PO TBSO: 2000.0000 mg | ORAL_TABLET | Freq: Every day | ORAL | Status: DC

## 2012-09-10 MED ORDER — DEFEROXAMINE MESYLATE 2 G IJ SOLR
15.0000 mg/kg/h | Freq: Once | INTRAMUSCULAR | Status: AC
  Administered 2012-09-10: 15 mg/kg/h via INTRAVENOUS
  Filled 2012-09-10: qty 2

## 2012-09-10 MED ORDER — HYDROMORPHONE HCL 8 MG PO TABS: 8.0000 mg | ORAL_TABLET | Freq: Four times a day (QID) | ORAL | Status: DC | PRN

## 2012-09-10 MED ORDER — TEMAZEPAM 15 MG PO CAPS: 15.0000 mg | ORAL_CAPSULE | Freq: Every evening | ORAL | Status: DC | PRN

## 2012-09-10 NOTE — Progress Notes (Signed)
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  CURRENT THERAPY: 1. Hydrea 500 mg p.o. t.i.d. 2. Exjade 2 g p.o. daily. 3. Folic acid 1 mg p.o. daily. 4. Coumadin 2 mg p.o. daily.  INTERIM HISTORY:  Mr. Dugo comes in for his followup.  We have not had to transfuse him for about I think 3 or 4 months.  This is actually quite good for him.  He is feeling a little more tired.  He is having a little more in the way of pain.  My main concern with him is the iron overload.  His last ferritin was almost 4700.  Iron saturation was 92%.  I increased his Exjade up to 2 g a day.  He is diligent with taking this.  He has had no bleeding.  He has had no fever.  He has had no cough.  His spleen was taken out because of splenomegaly.  He is trying to exercise more.  PHYSICAL EXAMINATION:  General:  This is a well-developed, well- nourished black gentleman in no obvious distress.  Vital signs:  Show a temperature of 98.6, pulse 90, respiratory rate 18, blood pressure 116/48.  Weight is 131.  Head and neck:  Shows a normocephalic, atraumatic skull.  There are no ocular or oral lesions.  There are no palpable cervical or supraclavicular lymph nodes.  Lungs:  Clear to percussion and auscultation bilaterally.  Cardiac:  Regular rate and rhythm with a normal S1 and S2.  There are no murmurs, rubs or bruits. Abdomen:  Soft with good bowel sounds.  There is no palpable abdominal mass.  There is a well-healed splenectomy scar.  There is no palpable hepatomegaly.  Extremities:  Show no clubbing, cyanosis or edema.  LABORATORY STUDIES:  Show a white cell count of 18.9, hemoglobin 8, hematocrit 34, platelet count is 451.  MCV is 92.  IMPRESSION:  Mr. Broker is a 37 year old gentleman with hemoglobin SS disease.  We will go ahead and set up an exchange transfusion for him. This I think is going to be helpful.  We will plan to get him back in  another month or so.  We will go ahead and refill his pain medications today.  If his iron does not improve then somehow we are going to have to try to do Desferal infusions at home.    ______________________________ [name], M.D. PRE/MEDQ  D:  09/10/2012  T:  09/10/2012  Job:  [id number]

## 2012-09-10 NOTE — Progress Notes (Signed)
This office note has been dictated.

## 2012-09-11 ENCOUNTER — Encounter (HOSPITAL_COMMUNITY)
Admission: RE | Admit: 2012-09-11 | Discharge: 2012-09-11 | Disposition: A | Discharge status: Home / Self Care | Payer: Medicaid Other | Source: Ambulatory Visit | Attending: Hematology & Oncology | Admitting: Hematology & Oncology

## 2012-09-11 DIAGNOSIS — D571 Sickle-cell disease without crisis: Secondary | ICD-10-CM | POA: Insufficient documentation

## 2012-09-12 LAB — RETICULOCYTES (CHCC)
ABS Retic: 290.4 10*3/uL — ABNORMAL HIGH (ref 19.0–186.0)
RBC.: 2.4 MIL/uL — ABNORMAL LOW (ref 4.22–5.81)
Retic Ct Pct: 12.1 % — ABNORMAL HIGH (ref 0.4–2.3)

## 2012-09-12 LAB — COMPREHENSIVE METABOLIC PANEL
ALT: 18 U/L (ref 0–53)
CO2: 25 mEq/L (ref 19–32)
Calcium: 9.1 mg/dL (ref 8.4–10.5)
Chloride: 107 mEq/L (ref 96–112)
Glucose, Bld: 100 mg/dL — ABNORMAL HIGH (ref 70–99)
Sodium: 140 mEq/L (ref 135–145)
Total Protein: 6.7 g/dL (ref 6.0–8.3)

## 2012-09-12 LAB — IRON AND TIBC
%SAT: 51 % (ref 20–55)
Iron: 132 ug/dL (ref 42–165)
TIBC: 259 ug/dL (ref 215–435)
UIBC: 127 ug/dL (ref 125–400)

## 2012-09-12 LAB — HEMOGLOBINOPATHY EVALUATION
Hgb A2 Quant: 2.7 % (ref 2.2–3.2)
Hgb A: 15.6 % — ABNORMAL LOW (ref 96.8–97.8)

## 2012-09-13 ENCOUNTER — Ambulatory Visit (HOSPITAL_BASED_OUTPATIENT_CLINIC_OR_DEPARTMENT_OTHER): Payer: Medicaid Other

## 2012-09-13 ENCOUNTER — Encounter: Payer: Self-pay | Admitting: *Deleted

## 2012-09-13 ENCOUNTER — Other Ambulatory Visit: Payer: Self-pay | Admitting: *Deleted

## 2012-09-13 VITALS — BP 103/51 | HR 68 | Temp 98.3°F | Resp 20 | Wt 131.0 lb

## 2012-09-13 DIAGNOSIS — D5700 Hb-SS disease with crisis, unspecified: Secondary | ICD-10-CM

## 2012-09-13 DIAGNOSIS — D571 Sickle-cell disease without crisis: Secondary | ICD-10-CM

## 2012-09-13 MED ORDER — PROMETHAZINE HCL 25 MG/ML IJ SOLN
25.0000 mg | Freq: Four times a day (QID) | INTRAMUSCULAR | Status: DC | PRN
  Administered 2012-09-13: 25 mg via INTRAVENOUS

## 2012-09-13 MED ORDER — HYDROMORPHONE HCL PF 4 MG/ML IJ SOLN
2.0000 mg | Freq: Once | INTRAMUSCULAR | Status: AC
  Administered 2012-09-13: 2 mg via INTRAVENOUS

## 2012-09-13 MED ORDER — HYDROMORPHONE HCL PF 4 MG/ML IJ SOLN
6.0000 mg | Freq: Once | INTRAMUSCULAR | Status: AC
  Administered 2012-09-13: 6 mg via INTRAVENOUS

## 2012-09-13 MED ORDER — FUROSEMIDE 10 MG/ML IJ SOLN
20.0000 mg | Freq: Once | INTRAMUSCULAR | Status: AC
  Administered 2012-09-13: 20 mg via INTRAVENOUS

## 2012-09-13 MED ORDER — SODIUM CHLORIDE 0.9 % IV SOLN
15.0000 mg/kg/h | Freq: Once | INTRAVENOUS | Status: AC
  Administered 2012-09-13: 15 mg/kg/h via INTRAVENOUS
  Filled 2012-09-13: qty 2

## 2012-09-13 MED ORDER — SODIUM CHLORIDE 0.9 % IJ SOLN
10.0000 mL | INTRAMUSCULAR | Status: AC | PRN
  Administered 2012-09-13: 10 mL
  Filled 2012-09-13: qty 10

## 2012-09-13 MED ORDER — DEFERASIROX 500 MG PO TBSO
2000.0000 mg | ORAL_TABLET | Freq: Every day | ORAL | Status: DC
Start: 2012-09-13 — End: 2012-12-05

## 2012-09-13 MED ORDER — DIPHENHYDRAMINE HCL 25 MG PO CAPS
25.0000 mg | ORAL_CAPSULE | Freq: Once | ORAL | Status: AC
  Administered 2012-09-13: 25 mg via ORAL

## 2012-09-13 MED ORDER — CEFDINIR 300 MG PO CAPS: 300.0000 mg | ORAL_CAPSULE | Freq: Two times a day (BID) | ORAL | Status: DC

## 2012-09-13 MED ORDER — HEPARIN SOD (PORK) LOCK FLUSH 100 UNIT/ML IV SOLN
500.0000 [IU] | Freq: Every day | INTRAVENOUS | Status: AC | PRN
  Administered 2012-09-13: 500 [IU]
  Filled 2012-09-13: qty 5

## 2012-09-13 MED ORDER — SODIUM CHLORIDE 0.9 % IV SOLN
250.0000 mL | Freq: Once | INTRAVENOUS | Status: AC
  Administered 2012-09-13: 250 mL via INTRAVENOUS

## 2012-09-13 MED ORDER — ACETAMINOPHEN 325 MG PO TABS
650.0000 mg | ORAL_TABLET | Freq: Once | ORAL | Status: AC
  Administered 2012-09-13: 650 mg via ORAL

## 2012-09-13 MED ORDER — DIPHENHYDRAMINE HCL 50 MG/ML IJ SOLN: 25.0000 mg | Freq: Once | INTRAMUSCULAR | Status: DC

## 2012-09-13 NOTE — Progress Notes (Signed)
Christopher Warren presents today for phlebotomy per MD orders. Phlebotomy procedure started at 1000 and ended at 1025. 480 mls  removed. Patient observed for 30 minutes after procedure without any incident. Patient tolerated procedure well. To receive 2 units PRBCs.

## 2012-09-13 NOTE — Telephone Encounter (Signed)
Received a call from [facility] stating the pt called on Monday to report a change in his Exjade rx. Verified the change from 1500 to 2000 mg daily. Will fax a new rx to them at [phone].

## 2012-09-13 NOTE — Patient Instructions (Signed)
Deferoxamine injection What is this medicine? DEFEROXAMINE (dee fer OX a meen) helps to remove excess iron from the body. This may be necessary in patients who have received multiple blood transfusions and people who have ingested too much iron. This medicine may be used for other purposes; ask your health care provider or pharmacist if you have questions. What should I tell my health care provider before I take this medicine? They need to know if you have any of these conditions: -difficulty passing urine or very little urine -kidney disease -an unusual or allergic reaction to deferoxamine, other medicines, foods, dyes, or preservatives -pregnant or trying to get pregnant -breast-feeding How should I use this medicine? This medicine is for injection into a muscle, slow infusion into a vein, or infusion under the skin. It is usually given by a health care professional in a hospital or clinic setting. Talk to your pediatrician regarding the use of this medicine in children. While this medicine may be prescribed for children as young as 3 years for selected conditions, precautions do apply. Patients over 41 years old may have a stronger reaction and need a smaller dose. Overdosage: If you think you have taken too much of this medicine contact a poison control center or emergency room at once. NOTE: This medicine is only for you. Do not share this medicine with others. What if I miss a dose? This does not apply. What may interact with this medicine? Do not take this medicine with any of the following medications: -preparations containing iron This medicine may also interact with the following medications: -ascorbic acid (vitamin C) -[id number] - used in certain diagnostic tests -prochlorperazine This list may not describe all possible interactions. Give your health care provider a list of all the medicines, herbs, non-prescription drugs, or dietary supplements you use. Also tell them if you  smoke, drink alcohol, or use illegal drugs. Some items may interact with your medicine. What should I watch for while using this medicine? Your condition will be monitored carefully while you are receiving this medicine. Visit your doctor or health care professional for regular checks on your progress. Tell your doctor or health care professional as soon as you can if you notice any change in your sight or hearing. You may get drowsy or dizzy or have problems with your vision. Do not drive, use machinery, or do anything that needs mental alertness until you know how this medicine affects you. Do not stand or sit up quickly, especially if you are an older patient. This reduces the risk of dizzy or fainting spells. While you are receiving this medicine, do not take any vitamin C products unless your doctor or health care professional tells you to. What side effects may I notice from receiving this medicine? Side effects that you should report to your doctor or health care professional as soon as possible: -allergic reactions like skin rash, itching or hives, swelling of the face, lips, or tongue -breathing problems -change in vision -diarrhea -fast heartbeat -feel dizzy, faint -fever -loss of hearing -muscle cramps -pain, swelling where injected -skin flushing, redness -stomach pain Side effects that usually do not require medical attention (report to your doctor or health care professional if they continue or are bothersome): -red coloration of urine This list may not describe all possible side effects. Call your doctor for medical advice about side effects. You may report side effects to FDA at 1-800-FDA-1088. Where should I keep my medicine? This drug is given in a hospital  or clinic and will not be stored at home. NOTE: This sheet is a summary. It may not cover all possible information. If you have questions about this medicine, talk to your doctor, pharmacist, or health care provider.   2013, [employer]vier/Gold Standard. (09/20/2007 5:37:06 PM) Blood Transfusion  A blood transfusion replaces your blood or some of its parts. Blood is replaced when you have lost blood because of surgery, an accident, or for severe blood conditions like anemia. You can donate blood to be used on yourself if you have a planned surgery. If you lose blood during that surgery, your own blood can be given back to you. Any blood given to you is checked to make sure it matches your blood type. Your temperature, blood pressure, and heart rate (vital signs) will be checked often.  GET HELP RIGHT AWAY IF:   You feel sick to your stomach (nauseous) or throw up (vomit).  You have watery poop (diarrhea).  You have shortness of breath or trouble breathing.  You have blood in your pee (urine) or have dark colored pee.  You have chest pain or tightness.  Your eyes or skin turn yellow (jaundice).  You have a temperature by mouth above 102 F (38.9 C), not controlled by medicine.  You start to shake and have chills.  You develop a a red rash (hives) or feel itchy.  You develop lightheadedness or feel confused.  You develop back, joint, or muscle pain.  You do not feel hungry (lost appetite).  You feel tired, restless, or nervous.  You develop belly (abdominal) cramps. Document Released: 08/26/2008 Document Revised: 08/22/2011 Document Reviewed: 08/26/2008 [facility] Patient Information 2013 [place], [place].

## 2012-09-14 ENCOUNTER — Encounter: Payer: Self-pay | Admitting: Hematology & Oncology

## 2012-09-14 LAB — TYPE AND SCREEN: ABO/RH(D): O POS

## 2012-09-24 ENCOUNTER — Telehealth: Payer: Self-pay | Admitting: *Deleted

## 2012-09-24 NOTE — Telephone Encounter (Signed)
Pt called stating "I think I got my girlfriend's cold and don't feel well. Wanted to talk to Dr Myna Hidalgo about what to do". Asked him what he meant about not feeling well and he stated he was having pain. Reviewed with Dr Myna Hidalgo. He is to go to [facility] for possible admission to the sickle cell clinic floor. Dr. Myna Hidalgo is in the office 1/2 days today and tomorrow without any additional coverage. He 1st stated he didn't know where that was. Asked him if he knew where [facility] was and he said "yes". Then explained to him that he is to report to the ER there like he has in the past and they will admit him to the sickle cell unit if deemed appropriate. He responded by saying "I'll see if I can get a ride there".

## 2012-10-03 ENCOUNTER — Other Ambulatory Visit: Payer: Self-pay | Admitting: Hematology & Oncology

## 2012-10-04 ENCOUNTER — Emergency Department (HOSPITAL_BASED_OUTPATIENT_CLINIC_OR_DEPARTMENT_OTHER)
Admission: EM | Admit: 2012-10-04 | Discharge: 2012-10-05 | Disposition: A | Discharge status: Home / Self Care | Payer: Medicaid Other | Attending: Emergency Medicine | Admitting: Emergency Medicine

## 2012-10-04 ENCOUNTER — Encounter (HOSPITAL_BASED_OUTPATIENT_CLINIC_OR_DEPARTMENT_OTHER): Payer: Self-pay | Admitting: Emergency Medicine

## 2012-10-04 DIAGNOSIS — R110 Nausea: Secondary | ICD-10-CM | POA: Insufficient documentation

## 2012-10-04 DIAGNOSIS — D5700 Hb-SS disease with crisis, unspecified: Secondary | ICD-10-CM | POA: Insufficient documentation

## 2012-10-04 DIAGNOSIS — R011 Cardiac murmur, unspecified: Secondary | ICD-10-CM | POA: Insufficient documentation

## 2012-10-04 DIAGNOSIS — Z91040 Latex allergy status: Secondary | ICD-10-CM | POA: Insufficient documentation

## 2012-10-04 DIAGNOSIS — M79609 Pain in unspecified limb: Secondary | ICD-10-CM | POA: Insufficient documentation

## 2012-10-04 DIAGNOSIS — M545 Low back pain, unspecified: Secondary | ICD-10-CM | POA: Insufficient documentation

## 2012-10-04 DIAGNOSIS — Z7901 Long term (current) use of anticoagulants: Secondary | ICD-10-CM | POA: Insufficient documentation

## 2012-10-04 DIAGNOSIS — J45909 Unspecified asthma, uncomplicated: Secondary | ICD-10-CM | POA: Insufficient documentation

## 2012-10-04 DIAGNOSIS — F17200 Nicotine dependence, unspecified, uncomplicated: Secondary | ICD-10-CM | POA: Insufficient documentation

## 2012-10-04 DIAGNOSIS — Z79899 Other long term (current) drug therapy: Secondary | ICD-10-CM | POA: Insufficient documentation

## 2012-10-04 MED ORDER — HYDROMORPHONE HCL PF 2 MG/ML IJ SOLN
2.0000 mg | Freq: Once | INTRAMUSCULAR | Status: AC
  Administered 2012-10-05: 2 mg via INTRAVENOUS
  Filled 2012-10-04: qty 1

## 2012-10-04 MED ORDER — SODIUM CHLORIDE 0.9 % IV BOLUS (SEPSIS)
1000.0000 mL | Freq: Once | INTRAVENOUS | Status: AC
  Administered 2012-10-05: 1000 mL via INTRAVENOUS

## 2012-10-04 MED ORDER — DIPHENHYDRAMINE HCL 50 MG/ML IJ SOLN
25.0000 mg | Freq: Once | INTRAMUSCULAR | Status: AC
  Administered 2012-10-05: 25 mg via INTRAVENOUS
  Filled 2012-10-04: qty 1

## 2012-10-04 NOTE — ED Notes (Addendum)
Pt states he has been having his sickle cell pain for a whole day. Pt states he is hurting real bad. Pt states the pain is specifically in his lower back and legs. Pt states he took 8 mg of dilaudid at 3:00 today.

## 2012-10-04 NOTE — ED Provider Notes (Signed)
History     CSN: [id number]  Arrival date & time 10/04/12  2207   First MD Initiated Contact with Patient 10/04/12 2354      Chief Complaint  Patient presents with  . Sickle Cell Pain Crisis    (Consider location/radiation/quality/duration/timing/severity/associated sxs/prior treatment) HPI This is a 37 year old male with sickle cell hemoglobin S disease. He underwent a splenectomy last year which significantly improved his frequency and severity of sickle cell pain crises. When he does get sickle cell pain he is usually seen in the hematology clinic upstairs. He developed sickle cell pain about 24 hours ago. The pain is located in his lower back and legs and is characterized as like previous sickle cell pain. The pain has become severe. He attempted to get into the hematology clinic but it was about to close and advised him to come here. He has tried his home medication without adequate relief. He is not aware of having a fever. He denies chills. He denies chest pain or shortness of breath. He is nauseated from taking his pain medications.  Past Medical History  Diagnosis Date  . Sickle cell anemia   . Asthma   . 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 Heart murmur     Past Surgical History  Procedure Laterality Date  . Cholecystectomy    . 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 Sickle cell anemia Mother   . Sickle cell anemia Son     History  Substance Use Topics  . Smoking status: Current Every Day Smoker -- 1.00 packs/day for 5 years    Types: Cigarettes  . Smokeless tobacco: Never Used  . Alcohol Use: No      Review of Systems  All other systems reviewed and are negative.    Allergies  Morphine and related; Adhesive; and Latex  Home Medications   Current Outpatient Rx  Name  Route  Sig  Dispense   Refill  . albuterol (PROVENTIL HFA;VENTOLIN HFA) 108 (90 BASE) MCG/ACT inhaler   Inhalation   Inhale 2 puffs into the lungs every 6 (six) hours as needed for wheezing.   2 Inhaler   4   . deferasirox (EXJADE) 500 MG disintegrating tablet   Oral   Take 4 tablets (2,000 mg total) by mouth daily.   120 tablet   6   . diphenhydrAMINE (BENADRYL) 25 mg capsule   Oral   Take 25 mg by mouth every 6 (six) hours as needed. Itching.         . folic acid (FOLVITE) 1 MG tablet   Oral   Take 1 mg by mouth daily.          Marland Kitchen HYDROmorphone (DILAUDID) 8 MG tablet   Oral   Take 1 tablet (8 mg total) by mouth every 6 (six) hours as needed. For pain   120 tablet   0   . HYDROmorphone HCl 16 MG T24A   Oral   Take 1 tablet (16 mg total) by mouth daily.   30 tablet   0   . hydroxyurea (HYDREA) 500 MG capsule   Oral   Take 1 capsule (500 mg total) by mouth 3 (three) times daily.   90 capsule   1   . promethazine (PHENERGAN) 6.25 MG/5ML syrup   Oral   Take 10  mLs (12.5 mg total) by mouth 4 (four) times daily as needed for nausea.   960 mL   3   . temazepam (RESTORIL) 15 MG capsule   Oral   Take 1 capsule (15 mg total) by mouth at bedtime as needed for sleep.   30 capsule   2   . warfarin (COUMADIN) 2 MG tablet   Oral   Take 1 tablet (2 mg total) by mouth daily.   30 tablet   6   . cefdinir (OMNICEF) 300 MG capsule   Oral   Take 1 capsule (300 mg total) by mouth 2 (two) times daily.   10 capsule   0     BP 116/50  Pulse 83  Temp(Src) 99.1 F (37.3 C) (Oral)  Resp 16  Ht 5\' 5"  (1.651 m)  Wt 133 lb (60.328 kg)  BMI 22.13 kg/m2  SpO2 94%  Physical Exam General: Well-developed, well-nourished male in no acute distress; appearance consistent with age of record HENT: normocephalic, atraumatic Eyes: pupils equal round and reactive to light; extraocular muscles intact Neck: supple Heart: regular rate and rhythm Lungs: clear to auscultation bilaterally Abdomen: soft;  nondistended; nontender; well-healed left upper quadrant splenectomy scar; bowel sounds present Extremities: No deformity; full range of motion; pulses normal; calves and thighs nontender Neurologic: Awake, alert and oriented; motor function intact in all extremities and symmetric; no facial droop Skin: Warm and dry Psychiatric: Anxious; mildly agitated    ED Course  Procedures (including critical care time)    MDM   Nursing notes and vitals signs, including pulse oximetry, reviewed.  Summary of this visit's results, reviewed by myself:  Labs:  Results for orders placed during the hospital encounter of 10/04/12 (from the past 24 hour(s))  CBC WITH DIFFERENTIAL     Status: Abnormal   Collection Time    10/04/12 11:50 PM      Result Value Range   WBC 27.6 (*) 4.0 - 10.5 K/uL   RBC 2.30 (*) 4.22 - 5.81 MIL/uL   Hemoglobin 7.5 (*) 13.0 - 17.0 g/dL   HCT 16.1 (*) 09.6 - 04.5 %   MCV 90.9  78.0 - 100.0 fL   MCH 32.6  26.0 - 34.0 pg   MCHC 35.9  30.0 - 36.0 g/dL   RDW 40.9 (*) 81.1 - 91.4 %   Platelets 506 (*) 150 - 400 K/uL   Neutrophils Relative 71  43 - 77 %   Lymphocytes Relative 19  12 - 46 %   Monocytes Relative 8  3 - 12 %   Eosinophils Relative 2  0 - 5 %   Basophils Relative 0  0 - 1 %   Neutro Abs 19.6 (*) 1.7 - 7.7 K/uL   Lymphs Abs 5.2 (*) 0.7 - 4.0 K/uL   Monocytes Absolute 2.2 (*) 0.1 - 1.0 K/uL   Eosinophils Absolute 0.6  0.0 - 0.7 K/uL   Basophils Absolute 0.0  0.0 - 0.1 K/uL   RBC Morphology POLYCHROMASIA PRESENT     WBC Morphology WHITE COUNT CONFIRMED ON SMEAR     Smear Review PLATELET COUNT CONFIRMED BY SMEAR    BASIC METABOLIC PANEL     Status: Abnormal   Collection Time    10/04/12 11:50 PM      Result Value Range   Sodium 140  135 - 145 mEq/L   Potassium 3.2 (*) 3.5 - 5.1 mEq/L   Chloride 103  96 - 112 mEq/L   CO2 30  19 - 32 mEq/L   Glucose, Bld 94  70 - 99 mg/dL   BUN 8  6 - 23 mg/dL   Creatinine, Ser 6.29  0.50 - 1.35 mg/dL   Calcium 9.3   8.4 - 52.8 mg/dL   GFR calc non Af Amer >90  >90 mL/min   GFR calc Af Amer >90  >90 mL/min  RETICULOCYTES     Status: Abnormal   Collection Time    10/04/12 11:50 PM      Result Value Range   Retic Ct Pct 12.4 (*) 0.4 - 3.1 %   RBC. 2.35 (*) 4.22 - 5.81 MIL/uL   Retic Count, Manual 291.4 (*) 19.0 - 186.0 K/uL  PROTIME-INR     Status: Abnormal   Collection Time    10/05/12 12:03 AM      Result Value Range   Prothrombin Time 15.9 (*) 11.6 - 15.2 seconds   INR 1.30  0.00 - 1.49  URINALYSIS, ROUTINE W REFLEX MICROSCOPIC     Status: Abnormal   Collection Time    10/05/12  2:25 AM      Result Value Range   Color, Urine AMBER (*) YELLOW   APPearance CLEAR  CLEAR   Specific Gravity, Urine 1.012  1.005 - 1.030   pH 6.0  5.0 - 8.0   Glucose, UA NEGATIVE  NEGATIVE mg/dL   Hgb urine dipstick NEGATIVE  NEGATIVE   Bilirubin Urine NEGATIVE  NEGATIVE   Ketones, ur NEGATIVE  NEGATIVE mg/dL   Protein, ur NEGATIVE  NEGATIVE mg/dL   Urobilinogen, UA 4.0 (*) 0.0 - 1.0 mg/dL   Nitrite NEGATIVE  NEGATIVE   Leukocytes, UA NEGATIVE  NEGATIVE    Imaging Studies: Dg Chest 2 View  10/05/2012  *RADIOLOGY REPORT*  Clinical Data: Sickle cell anemia.  Asthma.  Smoker.  CHEST - 2 VIEW  Comparison: 04/24/2012  Findings: Mild hyperinflation.  A right-sided Port-A-Cath terminates at the low SVC.  Midline trachea.  Mild cardiomegaly. Mediastinal contours otherwise within normal limits.  No pleural effusion or pneumothorax.  Mild interstitial thickening, without lobar consolidation.  IMPRESSION:  1.  Mild cardiomegaly. 2.  Chronic interstitial thickening, possibly related to chronic bronchitis or smoking. 3. No acute superimposed process.   Original Report Authenticated By: Jeronimo Greaves, M.D.    4:46 AM Patient feeling better at this time after IV fluids and IV medications. 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 Myna Hidalgo has  done in the past. He does not want to be admitted to the hospital, but will contact Dr. Myna Hidalgo this morning to arrange follow-up.          Hanley Seamen, MD 10/05/12 [phone]

## 2012-10-05 ENCOUNTER — Other Ambulatory Visit: Payer: Self-pay | Admitting: Hematology & Oncology

## 2012-10-05 ENCOUNTER — Emergency Department (HOSPITAL_BASED_OUTPATIENT_CLINIC_OR_DEPARTMENT_OTHER): Payer: Medicaid Other

## 2012-10-05 DIAGNOSIS — D571 Sickle-cell disease without crisis: Secondary | ICD-10-CM

## 2012-10-05 LAB — URINALYSIS, ROUTINE W REFLEX MICROSCOPIC
Glucose, UA: NEGATIVE mg/dL
Leukocytes, UA: NEGATIVE
Nitrite: NEGATIVE
pH: 6 (ref 5.0–8.0)

## 2012-10-05 LAB — CBC WITH DIFFERENTIAL/PLATELET
Basophils Relative: 0 % (ref 0–1)
Eosinophils Absolute: 0.6 10*3/uL (ref 0.0–0.7)
Hemoglobin: 7.5 g/dL — ABNORMAL LOW (ref 13.0–17.0)
Lymphs Abs: 5.2 10*3/uL — ABNORMAL HIGH (ref 0.7–4.0)
MCH: 32.6 pg (ref 26.0–34.0)
MCHC: 35.9 g/dL (ref 30.0–36.0)
Monocytes Absolute: 2.2 10*3/uL — ABNORMAL HIGH (ref 0.1–1.0)
Neutro Abs: 19.6 10*3/uL — ABNORMAL HIGH (ref 1.7–7.7)
Neutrophils Relative %: 71 % (ref 43–77)

## 2012-10-05 LAB — BASIC METABOLIC PANEL
BUN: 8 mg/dL (ref 6–23)
CO2: 30 mEq/L (ref 19–32)
Chloride: 103 mEq/L (ref 96–112)
Creatinine, Ser: 0.7 mg/dL (ref 0.50–1.35)
Glucose, Bld: 94 mg/dL (ref 70–99)

## 2012-10-05 LAB — PROTIME-INR: Prothrombin Time: 15.9 seconds — ABNORMAL HIGH (ref 11.6–15.2)

## 2012-10-05 MED ORDER — HYDROMORPHONE HCL PF 2 MG/ML IJ SOLN
2.0000 mg | Freq: Once | INTRAMUSCULAR | Status: AC
  Administered 2012-10-05: 2 mg via INTRAVENOUS
  Filled 2012-10-05: qty 1

## 2012-10-05 MED ORDER — ONDANSETRON HCL 4 MG/2ML IJ SOLN
INTRAMUSCULAR | Status: AC
  Administered 2012-10-05: 4 mg via INTRAVENOUS
  Filled 2012-10-05: qty 2

## 2012-10-05 MED ORDER — CEFDINIR 300 MG PO CAPS: 300.0000 mg | ORAL_CAPSULE | Freq: Two times a day (BID) | ORAL | Status: DC

## 2012-10-05 MED ORDER — SODIUM CHLORIDE 0.9 % IV BOLUS (SEPSIS)
1000.0000 mL | Freq: Once | INTRAVENOUS | Status: AC
  Administered 2012-10-05: 1000 mL via INTRAVENOUS

## 2012-10-05 MED ORDER — DEXTROSE 5 % IV SOLN
1.0000 g | Freq: Once | INTRAVENOUS | Status: AC
  Administered 2012-10-05: 1 g via INTRAVENOUS
  Filled 2012-10-05: qty 10

## 2012-10-05 MED ORDER — ONDANSETRON HCL 4 MG/2ML IJ SOLN: 4.0000 mg | Freq: Once | INTRAMUSCULAR | Status: AC

## 2012-10-05 NOTE — ED Notes (Signed)
MD at bedside. 

## 2012-10-05 NOTE — ED Notes (Signed)
Pt's right chest port-a-cath accessed using a 20g x 1 inch Huber needle. Site cleaned with chlorhexidine, dry upon access, no topical numbing agent, sterile gloves and masks used, good blood return, pt tolerated well.

## 2012-10-05 NOTE — ED Notes (Signed)
Pt reports still unable to void at this time. Urinal at bedside.

## 2012-10-08 ENCOUNTER — Ambulatory Visit: Payer: Medicaid Other

## 2012-10-08 NOTE — Progress Notes (Signed)
Patient was no show today.

## 2012-10-10 IMAGING — CT CT ABD-PELV W/ CM
2 of 4 series · 15 of 46 positions shown, 17 images · IV contrast (APPLIED)
Comparison: None.

CLINICAL DATA: Left upper abdominal pain

CT ABDOMEN AND PELVIS WITH CONTRAST
TECHNIQUE: Multidetector CT imaging of the abdomen and pelvis was
performed following the standard protocol during bolus
administration of intravenous contrast.
Contrast: 100mL OMNIPAQUE IOHEXOL 300 MG/ML IV SOLN

[Series 2: abd/pelvis 5.0 b31f · axial · 0.57mm/px · z∈[-428,-108]mm · 12 of 72 slices shown, 14 images]
[im 4/72  soft-tissue]
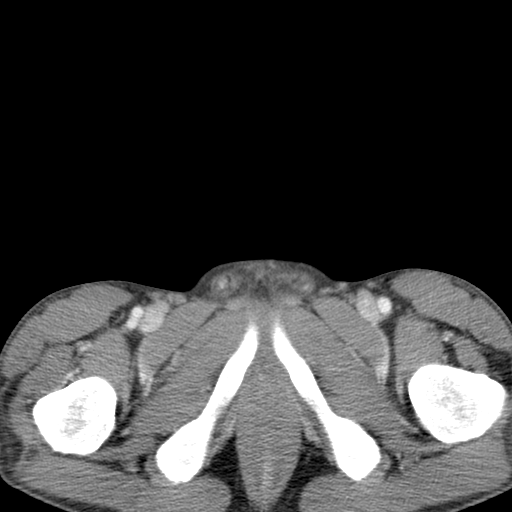
[im 4/72  bone]
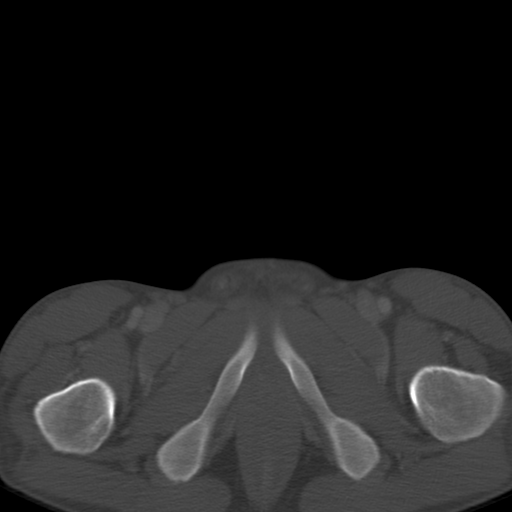
[im 11/72  soft-tissue]
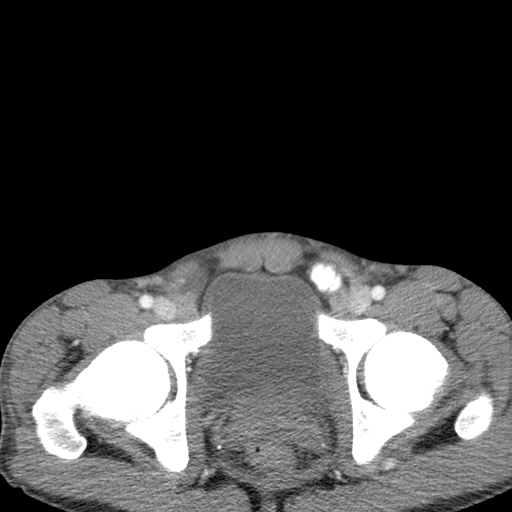
[im 17/72  soft-tissue]
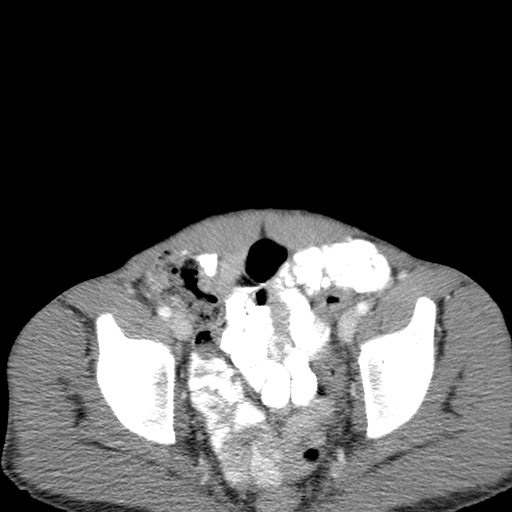
[im 21/72  soft-tissue]
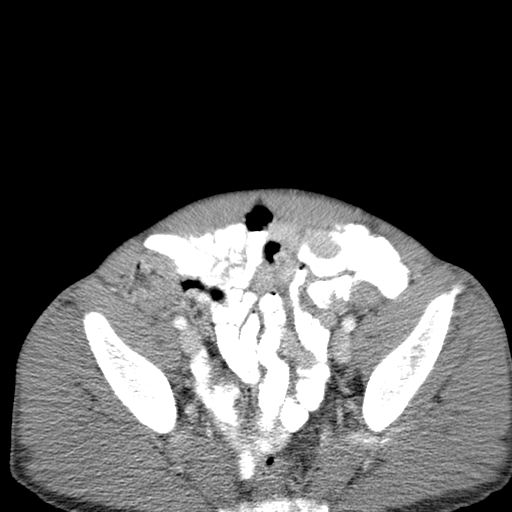
[im 28/72  soft-tissue]
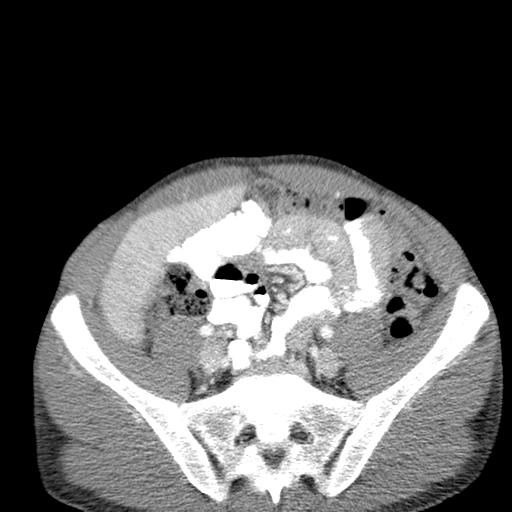
[im 34/72  soft-tissue]
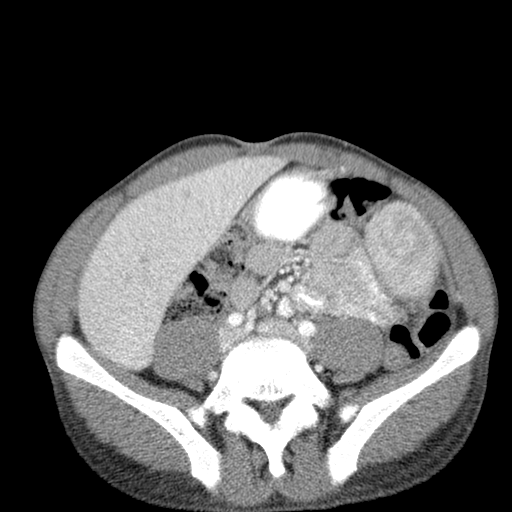
[im 38/72  soft-tissue]
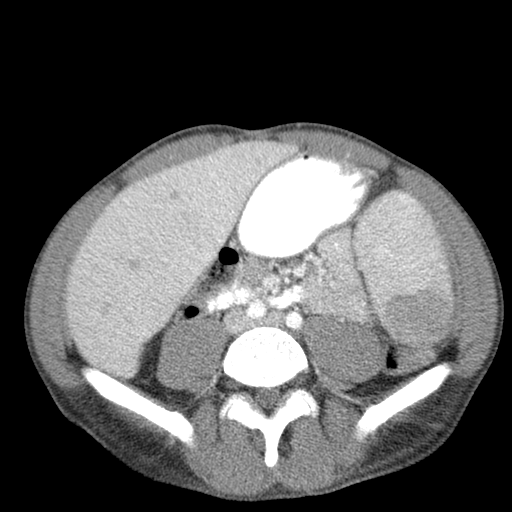
[im 44/72  soft-tissue]
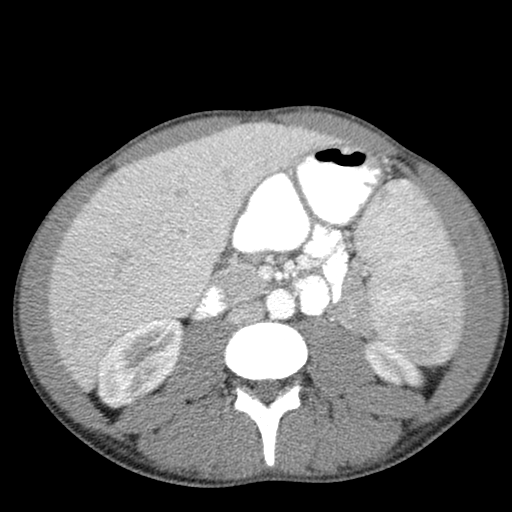
[im 51/72  soft-tissue]
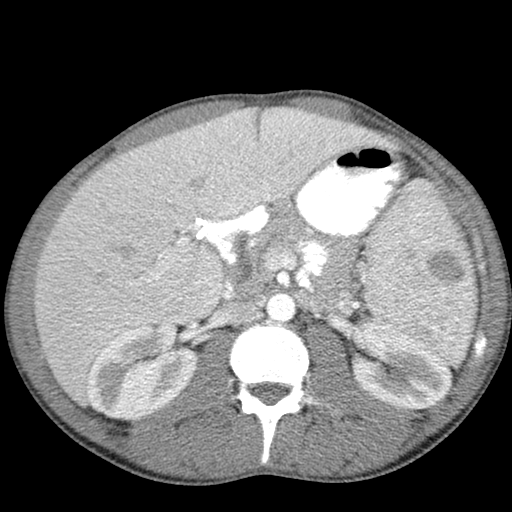
[im 51/72  bone]
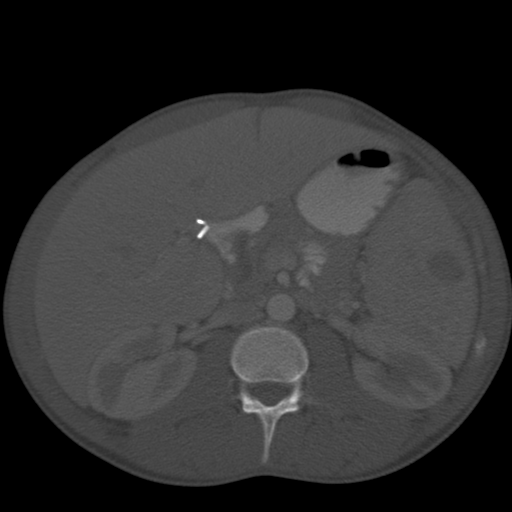
[im 55/72  soft-tissue]
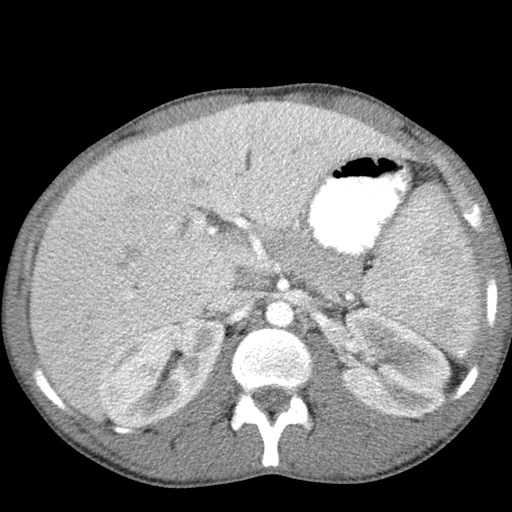
[im 61/72  soft-tissue]
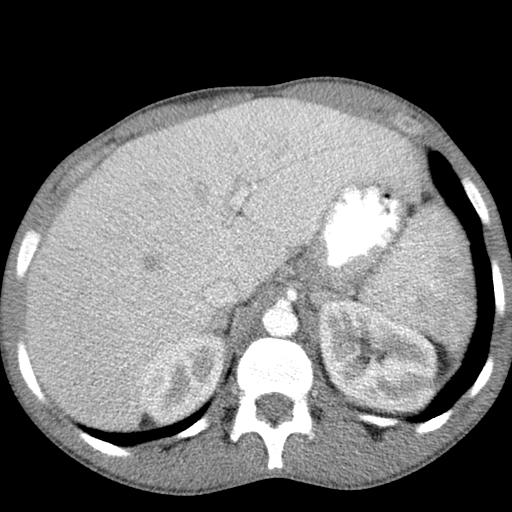
[im 68/72  soft-tissue]
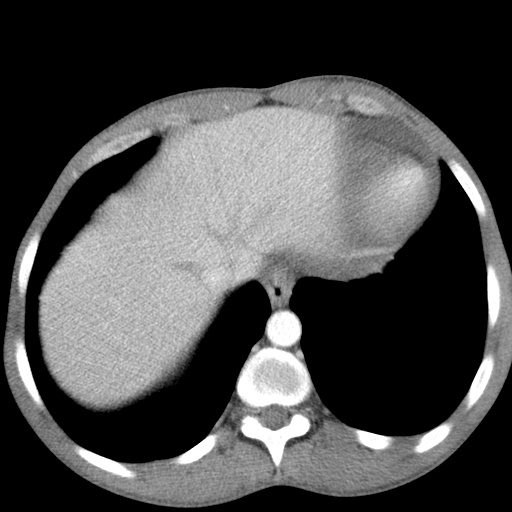

[Series 5: abd/pelvis 3.0 coronal · coronal · 0.57mm/px · 3 of 81 slices shown]
[im 27/81  soft-tissue]
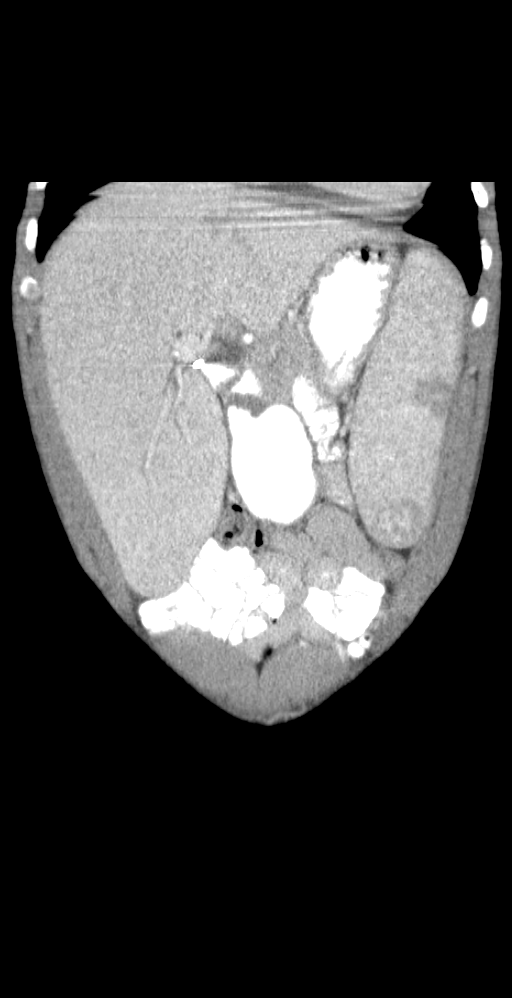
[im 36/81  soft-tissue]
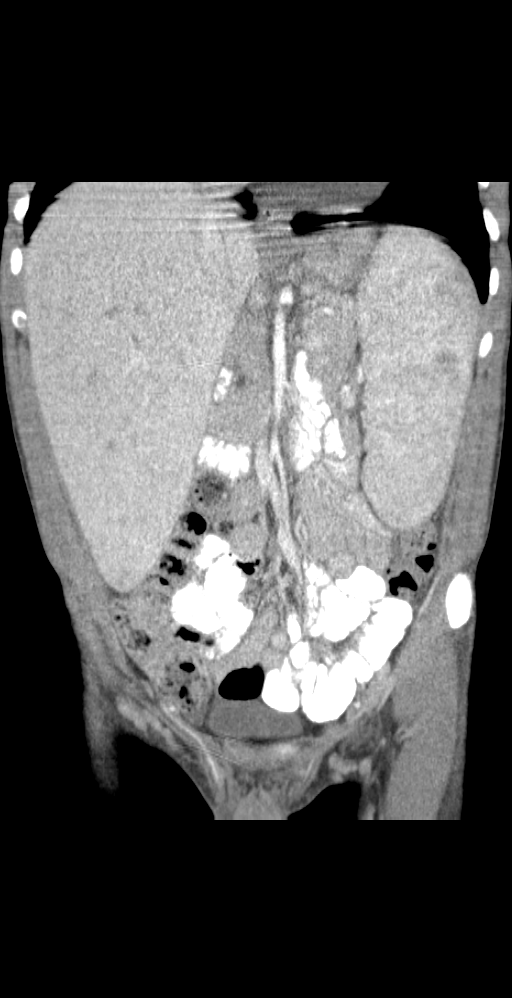
[im 45/81  soft-tissue]
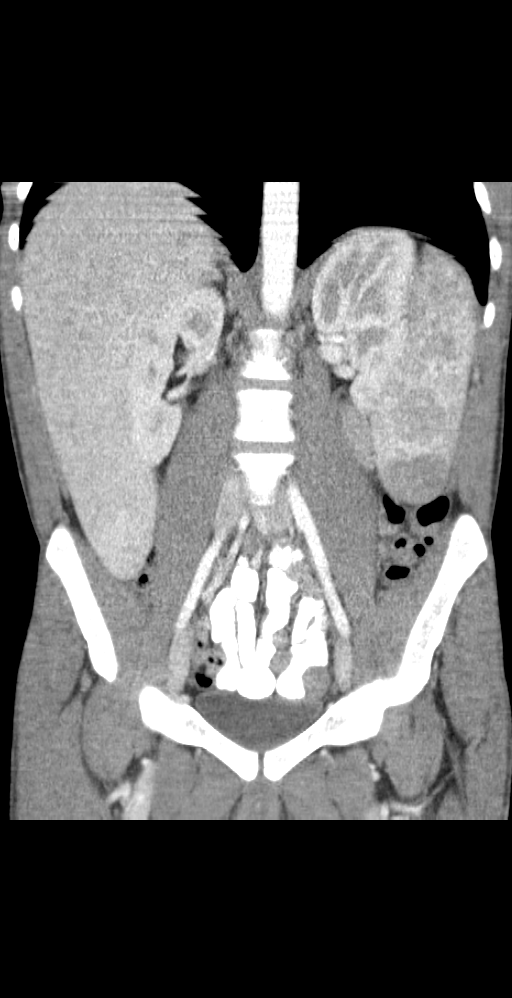

[15 of 46 positions shown; findings below may reference images not displayed]

FINDINGS: Poor technique limits the exam.

Mild pericardial effusion is partially imaged.

Several low density areas are scattered throughout the spleen.
Some are round and others are wedge-shaped.  The spleen is enlarged
measuring 17.2 cm.  Post cholecystectomy.

Liver, pancreas, adrenal glands, kidneys are within normal limits.
No free fluid.

Prostate and bladder are within normal limits.  No abnormal
adenopathy.
IMPRESSION: Pericardial effusion.

Splenomegaly with multiple spleen hypodensities.  Considerations
include splenic infarcts and infarctions.  Given history of sickle
cell disorder, splenic infarcts are favored.

## 2012-10-11 ENCOUNTER — Ambulatory Visit (HOSPITAL_BASED_OUTPATIENT_CLINIC_OR_DEPARTMENT_OTHER): Payer: Medicaid Other | Admitting: Hematology & Oncology

## 2012-10-11 ENCOUNTER — Ambulatory Visit (HOSPITAL_BASED_OUTPATIENT_CLINIC_OR_DEPARTMENT_OTHER): Payer: Medicaid Other

## 2012-10-11 ENCOUNTER — Other Ambulatory Visit (HOSPITAL_BASED_OUTPATIENT_CLINIC_OR_DEPARTMENT_OTHER): Payer: Medicaid Other | Admitting: Lab

## 2012-10-11 ENCOUNTER — Encounter (HOSPITAL_COMMUNITY)
Admission: RE | Admit: 2012-10-11 | Discharge: 2012-10-11 | Disposition: A | Discharge status: Home / Self Care | Payer: Medicaid Other | Source: Ambulatory Visit | Attending: Hematology & Oncology | Admitting: Hematology & Oncology

## 2012-10-11 VITALS — BP 102/47 | HR 81 | Temp 98.6°F | Resp 18 | Ht 65.0 in | Wt 131.0 lb

## 2012-10-11 DIAGNOSIS — D571 Sickle-cell disease without crisis: Secondary | ICD-10-CM

## 2012-10-11 DIAGNOSIS — R52 Pain, unspecified: Secondary | ICD-10-CM

## 2012-10-11 DIAGNOSIS — J189 Pneumonia, unspecified organism: Secondary | ICD-10-CM

## 2012-10-11 DIAGNOSIS — F17200 Nicotine dependence, unspecified, uncomplicated: Secondary | ICD-10-CM

## 2012-10-11 DIAGNOSIS — D5700 Hb-SS disease with crisis, unspecified: Secondary | ICD-10-CM

## 2012-10-11 LAB — CMP (CANCER CENTER ONLY)
ALT(SGPT): 26 U/L (ref 10–47)
AST: 34 U/L (ref 11–38)
Alkaline Phosphatase: 101 U/L — ABNORMAL HIGH (ref 26–84)
BUN, Bld: 10 mg/dL (ref 7–22)
Calcium: 9.5 mg/dL (ref 8.0–10.3)
Chloride: 103 mEq/L (ref 98–108)
Creat: 0.5 mg/dl — ABNORMAL LOW (ref 0.6–1.2)
Total Bilirubin: 4.1 mg/dl — ABNORMAL HIGH (ref 0.20–1.60)

## 2012-10-11 LAB — CBC WITH DIFFERENTIAL (CANCER CENTER ONLY)
BASO%: 0.5 % (ref 0.0–2.0)
Eosinophils Absolute: 0.3 10*3/uL (ref 0.0–0.5)
MCH: 32.4 pg (ref 28.0–33.4)
MONO#: 1.8 10*3/uL — ABNORMAL HIGH (ref 0.1–0.9)
MONO%: 8.7 % (ref 0.0–13.0)
NEUT#: 14.4 10*3/uL — ABNORMAL HIGH (ref 1.5–6.5)
Platelets: 581 10*3/uL — ABNORMAL HIGH (ref 145–400)
RBC: 2.75 10*6/uL — ABNORMAL LOW (ref 4.20–5.70)
RDW: 18.8 % — ABNORMAL HIGH (ref 11.1–15.7)
WBC: 20.1 10*3/uL — ABNORMAL HIGH (ref 4.0–10.0)

## 2012-10-11 MED ORDER — HYDROMORPHONE HCL PF 4 MG/ML IJ SOLN
8.0000 mg | Freq: Once | INTRAMUSCULAR | Status: AC
  Administered 2012-10-11: 8 mg via INTRAVENOUS

## 2012-10-11 MED ORDER — FOLIC ACID 1 MG PO TABS: ORAL_TABLET | ORAL | Status: DC

## 2012-10-11 MED ORDER — PROMETHAZINE HCL 25 MG/ML IJ SOLN
25.0000 mg | Freq: Four times a day (QID) | INTRAMUSCULAR | Status: DC | PRN
  Administered 2012-10-11: 25 mg via INTRAVENOUS

## 2012-10-11 MED ORDER — HYDROMORPHONE HCL PF 4 MG/ML IJ SOLN
2.0000 mg | Freq: Once | INTRAMUSCULAR | Status: AC
  Administered 2012-10-11: 2 mg via INTRAVENOUS

## 2012-10-11 MED ORDER — HYDROMORPHONE HCL 8 MG PO TABS: 8.0000 mg | ORAL_TABLET | Freq: Four times a day (QID) | ORAL | Status: DC | PRN

## 2012-10-11 MED ORDER — HYDROXYUREA 500 MG PO CAPS: 500.0000 mg | ORAL_CAPSULE | Freq: Three times a day (TID) | ORAL | Status: DC

## 2012-10-11 MED ORDER — HYDROMORPHONE HCL ER 16 MG PO T24A: 16.0000 mg | EXTENDED_RELEASE_TABLET | ORAL | Status: DC

## 2012-10-11 MED ORDER — SODIUM CHLORIDE 0.9 % IV SOLN
1000.0000 mL | INTRAVENOUS | Status: AC
  Administered 2012-10-11: 10:00:00 via INTRAVENOUS

## 2012-10-11 MED ORDER — DIPHENHYDRAMINE HCL 50 MG/ML IJ SOLN
25.0000 mg | Freq: Once | INTRAMUSCULAR | Status: AC
  Administered 2012-10-11: 25 mg via INTRAVENOUS

## 2012-10-11 MED ORDER — DEXTROSE 5 % IV SOLN
2.0000 g | INTRAVENOUS | Status: DC
  Administered 2012-10-11: 2 g via INTRAVENOUS
  Filled 2012-10-11: qty 2

## 2012-10-11 NOTE — Patient Instructions (Addendum)
Sickle Cell Pain Crisis Sickle cell anemia requires regular medical attention by your healthcare provider and awareness about when to seek medical care. Pain is a common problem in children with sickle cell disease. This usually starts at less than 35 year of age. Pain can occur nearly anywhere in the body but most commonly occurs in the extremities, back, chest, or belly (abdomen). Pain episodes can start suddenly or may follow an illness. These attacks can appear as decreased activity, loss of appetite, change in behavior, or simply complaints of pain. DIAGNOSIS   Specialized blood and gene testing can help make this diagnosis early in the disease. Blood tests may then be done to watch blood levels.  Specialized brain scans are done when there are problems in the brain during a crisis.  Lung testing may be done later in the disease. HOME CARE INSTRUCTIONS   Maintain good hydration. Increase you or your child's fluid intake in hot weather and during exercise.  Avoid smoking. Smoking lowers the oxygen in the blood and can cause the production of sickle-shaped cells (sickling).  Control pain. Only take over-the-counter or prescription medicines for pain, discomfort, or fever as directed by your caregiver. Do not give aspirin to children because of the association with Reye's syndrome.  Keep regular health care checks to keep a proper red blood cell (hemoglobin) level. A moderate anemia level protects against sickling crises.  You and your child should receive all the same immunizations and care as the people around you.  Mothers should breastfeed their babies if possible. Use formulas with iron added if breastfeeding is not possible. Additional iron should not be given unless there is a lack of it. People with sickle cell disease (SCD) build up iron faster than normal. Give folic acid and additional vitamins as directed.  If you or your child has been prescribed antibiotics or other medications  to prevent problems, take them as directed.  Summer camps are available for children with SCD. They may help young people deal with their disease. The camps introduce them to other children with the same problem.  Young people with SCD may become frustrated or angry at their disease. This can cause rebellion and refusal to follow medical care. Help groups or counseling may help with these problems.  Wear a medical alert bracelet. When traveling, keep medical information, caregiver's names, and the medications you or your child takes with you at all times. SEEK IMMEDIATE MEDICAL CARE IF:   You or your child develops dizziness or fainting, numbness in or difficulty with movement of arms and legs, difficulty with speech, or is acting abnormally. This could be early signs of a stroke. Immediate treatment is necessary.  You or your child has an oral temperature above 102 F (38.9 C), not controlled by medicine.  You or your child has other signs of infection (chills, lethargy, irritability, poor eating, vomiting). The younger the child, the more you should be concerned.  With fevers, do not give medicine to lower the fever right away. This could cover up a problem that is developing. Notify your caregiver.  You or your child develops pain that is not helped with medicine.  You or your child develops shortness of breath or is coughing up pus-like or bloody sputum.  You or your child develops any problems that are new and are causing you to worry.  You or your child develops a persistent, often uncomfortable and painful penile erection. This is called priapism. Always check young boys for   this. It is often embarrassing for them and they may not bring it to your attention. This is a medical emergency and needs immediate treatment. If this is not treated it will lead to impotence.  You or your child develops a new onset of abdominal pain, especially on the left side near the stomach area.  You or  your child has any questions or has problems that are not getting better. Return immediately if you feel your child is getting worse, even if your child was seen only a short while ago. Document Released: 03/09/2005 Document Revised: 08/22/2011 Document Reviewed: 07/29/2009 Exit[employer] Patient Information 2013 [employer].  

## 2012-10-11 NOTE — Progress Notes (Signed)
This office note has been dictated.

## 2012-10-11 NOTE — Addendum Note (Signed)
Addended by: Arlan Organ R on: 10/11/2012 10:06 AM   Modules accepted: Orders, Medications

## 2012-10-11 NOTE — Addendum Note (Signed)
Addended by: Arlan Organ R on: 10/11/2012 02:31 PM   Modules accepted: Orders, Medications

## 2012-10-12 ENCOUNTER — Other Ambulatory Visit: Payer: Self-pay | Admitting: *Deleted

## 2012-10-12 DIAGNOSIS — D571 Sickle-cell disease without crisis: Secondary | ICD-10-CM

## 2012-10-12 MED ORDER — FOLIC ACID 1 MG PO TABS: ORAL_TABLET | ORAL | Status: DC

## 2012-10-12 NOTE — Progress Notes (Signed)
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  CURRENT THERAPY: 1. Supportive care with IV fluids as indicated. 2. Exchange transfusions as indicated. 3. Exjade 2 g p.o. daily. 4. Folic acid 1 mg p.o. daily. 5. Coumadin 2 mg p.o. daily.  INTERIM HISTORY:  Christopher Warren comes in for his followup.  He is having a tough time.  He is crying.  He says he has not felt all that well.  He went to the emergency room last week.  At that point, his hemoglobin was, I think, 7.5.  We tried to get him in for an exchange. Unfortunately, he never returned our phone calls.  He says he is hurting in his back and legs.  This is pretty typical for him.  He has had no fever.  He has had no cough.  He is taking the Exjade, he says.  His last ferritin was 3221.  This was back in late March.  He has had no kidney issues from the Exjade.  He is still smoking.  He has had no rashes.  He has had no leg swelling.  PHYSICAL EXAMINATION:  General:  This is a fairly well-developed, well- nourished African American gentleman in mild distress secondary to pain. Vital Signs:  Temperature of 98.6, pulse 81, respiratory rate 18, blood pressure 102/47.  Weight is 131.  Head and neck:  Normocephalic, atraumatic skull.  There are no ocular or oral lesions.  There are no palpable cervical or supraclavicular lymph nodes.  Lungs:  Clear bilaterally.  There are no rales, wheezes, or rhonchi.  Cardiac: Regular rate and rhythm with a normal S1 and S2.  He has a 1/6 systolic ejection murmur.  Abdomen:  Soft.  He has good bowel sounds.  He has a well-healed splenectomy scar.  There is no palpable hepatomegaly.  There is no palpable abdominal mass.  Back:  Some tenderness over the thoracic and lumbar spine to palpation.  Extremities:  Some tenderness over the long bones.  No edema is noted in his legs.  Skin:  No rashes, ecchymoses, or petechiae.  LABORATORY STUDIES:   White cell count is 20, hemoglobin 8.9, hematocrit 25.6, platelet count 581.  MCV is 93.  IMPRESSION:  Christopher Warren is a 36 year old African American gentleman with hemoglobin SS disease.  He is holding his own with this.  There have been no admissions for him, which has been nice.  We are trying to minimize his exchanges because of the iron overload.  I think that for now we can probably just give him IV fluids.  He does well with this and pain medication.  Of note, he is on Exalgo and Dilaudid at home.  He knows that smoking is not helping the sickle cell.  He is trying to cut back on this.  For now, we will give him IV fluids.  We will continue to follow him every 3-4 weeks.    ______________________________ Christopher Warren, M.D. PRE/MEDQ  D:  10/11/2012  T:  10/12/2012  Job:  [id number]

## 2012-10-15 LAB — HEMOGLOBINOPATHY EVALUATION
Hemoglobin Other: 0 %
Hgb A: 31.5 % — ABNORMAL LOW (ref 96.8–97.8)
Hgb S Quant: 53.7 % — ABNORMAL HIGH

## 2012-10-15 LAB — RETICULOCYTES (CHCC)
ABS Retic: 285.6 10*3/uL — ABNORMAL HIGH (ref 19.0–186.0)
RBC.: 2.72 MIL/uL — ABNORMAL LOW (ref 4.22–5.81)
Retic Ct Pct: 10.5 % — ABNORMAL HIGH (ref 0.4–2.3)

## 2012-10-15 LAB — IRON AND TIBC
Iron: 107 ug/dL (ref 42–165)
UIBC: 141 ug/dL (ref 125–400)

## 2012-10-26 ENCOUNTER — Telehealth: Payer: Self-pay | Admitting: Hematology & Oncology

## 2012-10-26 NOTE — Telephone Encounter (Signed)
Pt called wanted appointment for next week, I told him had an appointment on 5-29 and he said he would wait till then. I advised him to call if he needed to be seen sooner. I asked him if he wanted to speak to RN, he declined. Amy RN aware

## 2012-10-30 ENCOUNTER — Other Ambulatory Visit: Payer: Self-pay | Admitting: Hematology & Oncology

## 2012-10-30 DIAGNOSIS — D5700 Hb-SS disease with crisis, unspecified: Secondary | ICD-10-CM

## 2012-10-30 DIAGNOSIS — D571 Sickle-cell disease without crisis: Secondary | ICD-10-CM

## 2012-10-30 MED ORDER — HYDROMORPHONE HCL ER 16 MG PO T24A: 16.0000 mg | EXTENDED_RELEASE_TABLET | ORAL | Status: DC

## 2012-10-30 MED ORDER — HYDROMORPHONE HCL 8 MG PO TABS: 8.0000 mg | ORAL_TABLET | Freq: Four times a day (QID) | ORAL | Status: DC | PRN

## 2012-10-31 ENCOUNTER — Telehealth: Payer: Self-pay | Admitting: Hematology & Oncology

## 2012-10-31 ENCOUNTER — Ambulatory Visit (HOSPITAL_BASED_OUTPATIENT_CLINIC_OR_DEPARTMENT_OTHER): Payer: Medicaid Other

## 2012-10-31 ENCOUNTER — Other Ambulatory Visit (HOSPITAL_BASED_OUTPATIENT_CLINIC_OR_DEPARTMENT_OTHER): Payer: Medicaid Other | Admitting: Lab

## 2012-10-31 DIAGNOSIS — D571 Sickle-cell disease without crisis: Secondary | ICD-10-CM

## 2012-10-31 LAB — CBC WITH DIFFERENTIAL (CANCER CENTER ONLY)
BASO#: 0.1 10*3/uL (ref 0.0–0.2)
EOS%: 1 % (ref 0.0–7.0)
HCT: 20.9 % — ABNORMAL LOW (ref 38.7–49.9)
HGB: 7.5 g/dL — ABNORMAL LOW (ref 13.0–17.1)
LYMPH%: 18.6 % (ref 14.0–48.0)
MCH: 33.9 pg — ABNORMAL HIGH (ref 28.0–33.4)
MCHC: 35.9 g/dL (ref 32.0–35.9)
MCV: 95 fL (ref 82–98)
MONO%: 10.7 % (ref 0.0–13.0)
NEUT%: 69.2 % (ref 40.0–80.0)

## 2012-10-31 LAB — TECHNOLOGIST REVIEW CHCC SATELLITE: Tech Review: 2

## 2012-10-31 MED ORDER — HYDROMORPHONE HCL PF 4 MG/ML IJ SOLN
2.0000 mg | Freq: Once | INTRAMUSCULAR | Status: AC
  Administered 2012-10-31: 2 mg via INTRAVENOUS

## 2012-10-31 MED ORDER — DIPHENHYDRAMINE HCL 50 MG/ML IJ SOLN: 25.0000 mg | Freq: Once | INTRAMUSCULAR | Status: DC

## 2012-10-31 MED ORDER — PROMETHAZINE HCL 25 MG/ML IJ SOLN
25.0000 mg | Freq: Four times a day (QID) | INTRAMUSCULAR | Status: DC | PRN
  Administered 2012-10-31: 25 mg via INTRAVENOUS

## 2012-10-31 MED ORDER — SODIUM CHLORIDE 0.9 % IV SOLN
Freq: Once | INTRAVENOUS | Status: AC
  Administered 2012-10-31: 11:00:00 via INTRAVENOUS

## 2012-10-31 MED ORDER — HYDROMORPHONE HCL PF 4 MG/ML IJ SOLN
8.0000 mg | Freq: Once | INTRAMUSCULAR | Status: AC
  Administered 2012-10-31: 8 mg via INTRAVENOUS

## 2012-10-31 NOTE — Telephone Encounter (Signed)
Pt aware of 5-21 appointment

## 2012-11-01 ENCOUNTER — Other Ambulatory Visit: Payer: Self-pay | Admitting: Hematology & Oncology

## 2012-11-01 ENCOUNTER — Telehealth: Payer: Self-pay | Admitting: Hematology & Oncology

## 2012-11-01 NOTE — Telephone Encounter (Signed)
Kendell Bane called saying that he was still hurting. He also stated that he was seen had a lot of swelling in his feet and ankles. He had just been here yesterday. We'll give IV fluids. He got his pain medication. He was doing better once he left ear.  Told that he probably had fluid around the ankles. I told him that we can call in a water pill for him.  He wanted me to see him. I told I just cannot see him. I have a full schedule tomorrow and that my PA was not going to be here. Because of this, I told him that he probably needs to go to the emergency room so they could look at him and evaluate him. He's been there before. I'm sure that they will know what to do. They may give him a diuretic for his ankles.  His girlfriend was with him at. She understood the conversation. I again reiterated to them that going to the emergency room probably was the best thing for him right now.  Again, we cannot see him here tomorrow. We don't have a ful staff of nurses. We cannot give him fluids or blood. Our clinic is full. It probably would be best for him to go to the emergency room for an evaluation. Again, his girlfriend understands this. I also think that he understands this.  Pete E.

## 2012-11-01 NOTE — Telephone Encounter (Signed)
Pt called wanting to speak to the MD. York Spaniel his feet were swollen and had a really bad headache. Gave MD and RN note

## 2012-11-08 ENCOUNTER — Ambulatory Visit (HOSPITAL_BASED_OUTPATIENT_CLINIC_OR_DEPARTMENT_OTHER): Payer: Medicaid Other | Admitting: Hematology & Oncology

## 2012-11-08 ENCOUNTER — Other Ambulatory Visit (HOSPITAL_BASED_OUTPATIENT_CLINIC_OR_DEPARTMENT_OTHER): Payer: Medicaid Other | Admitting: Lab

## 2012-11-08 ENCOUNTER — Ambulatory Visit (HOSPITAL_BASED_OUTPATIENT_CLINIC_OR_DEPARTMENT_OTHER): Payer: Medicaid Other

## 2012-11-08 VITALS — BP 129/42 | HR 92 | Temp 99.1°F | Resp 18 | Ht 65.0 in | Wt 128.0 lb

## 2012-11-08 DIAGNOSIS — D571 Sickle-cell disease without crisis: Secondary | ICD-10-CM

## 2012-11-08 DIAGNOSIS — D5700 Hb-SS disease with crisis, unspecified: Secondary | ICD-10-CM

## 2012-11-08 LAB — CBC WITH DIFFERENTIAL (CANCER CENTER ONLY)
BASO%: 0.5 % (ref 0.0–2.0)
EOS%: 1.6 % (ref 0.0–7.0)
HCT: 22.1 % — ABNORMAL LOW (ref 38.7–49.9)
LYMPH#: 3.6 10*3/uL — ABNORMAL HIGH (ref 0.9–3.3)
LYMPH%: 24.5 % (ref 14.0–48.0)
MCHC: 35.7 g/dL (ref 32.0–35.9)
MCV: 94 fL (ref 82–98)
NEUT%: 60.5 % (ref 40.0–80.0)
Platelets: 484 10*3/uL — ABNORMAL HIGH (ref 145–400)
RDW: 18.9 % — ABNORMAL HIGH (ref 11.1–15.7)
WBC: 14.8 10*3/uL — ABNORMAL HIGH (ref 4.0–10.0)

## 2012-11-08 LAB — BASIC METABOLIC PANEL
Chloride: 106 mEq/L (ref 96–112)
Potassium: 3.9 mEq/L (ref 3.5–5.3)
Sodium: 141 mEq/L (ref 135–145)

## 2012-11-08 LAB — HOLD TUBE, BLOOD BANK - CHCC SATELLITE

## 2012-11-08 LAB — IRON AND TIBC

## 2012-11-08 LAB — CHCC SATELLITE - SMEAR

## 2012-11-08 MED ORDER — SODIUM CHLORIDE 0.9 % IV SOLN
15.0000 mg/kg/h | Freq: Once | INTRAVENOUS | Status: AC
  Administered 2012-11-08: 15 mg/kg/h via INTRAVENOUS
  Filled 2012-11-08: qty 2

## 2012-11-08 MED ORDER — DIPHENHYDRAMINE HCL 50 MG/ML IJ SOLN
25.0000 mg | Freq: Once | INTRAMUSCULAR | Status: AC
  Administered 2012-11-08: 25 mg via INTRAVENOUS

## 2012-11-08 MED ORDER — PROMETHAZINE HCL 25 MG/ML IJ SOLN
25.0000 mg | Freq: Four times a day (QID) | INTRAMUSCULAR | Status: DC | PRN
  Administered 2012-11-08: 25 mg via INTRAVENOUS

## 2012-11-08 MED ORDER — SODIUM CHLORIDE 0.9 % IV SOLN
INTRAVENOUS | Status: AC
  Administered 2012-11-08: 09:00:00 via INTRAVENOUS

## 2012-11-08 MED ORDER — HYDROMORPHONE HCL PF 2 MG/ML IJ SOLN
2.0000 mg | Freq: Once | INTRAMUSCULAR | Status: AC
  Administered 2012-11-08: 2 mg via INTRAVENOUS

## 2012-11-08 MED ORDER — HYDROMORPHONE HCL PF 4 MG/ML IJ SOLN
8.0000 mg | INTRAMUSCULAR | Status: DC | PRN
  Administered 2012-11-08: 8 mg via INTRAVENOUS

## 2012-11-08 MED ORDER — SODIUM CHLORIDE 0.9 % IJ SOLN
10.0000 mL | INTRAMUSCULAR | Status: DC | PRN
  Administered 2012-11-08: 10 mL via INTRAVENOUS
  Filled 2012-11-08: qty 10

## 2012-11-08 MED ORDER — HEPARIN SOD (PORK) LOCK FLUSH 100 UNIT/ML IV SOLN
500.0000 [IU] | Freq: Once | INTRAVENOUS | Status: AC
  Administered 2012-11-08: 500 [IU] via INTRAVENOUS
  Filled 2012-11-08: qty 5

## 2012-11-08 MED ORDER — SODIUM CHLORIDE 0.9 % IV SOLN: 15.0000 mg/kg/h | Freq: Once | INTRAVENOUS | Status: DC

## 2012-11-08 NOTE — Patient Instructions (Signed)
Deferoxamine injection  What is this medicine?  DEFEROXAMINE (dee fer OX a meen) helps to remove excess iron from the body. This may be necessary in patients who have received multiple blood transfusions and people who have ingested too much iron.  This medicine may be used for other purposes; ask your health care provider or pharmacist if you have questions.  What should I tell my health care provider before I take this medicine?  They need to know if you have any of these conditions:  -difficulty passing urine or very little urine  -kidney disease  -an unusual or allergic reaction to deferoxamine, other medicines, foods, dyes, or preservatives  -pregnant or trying to get pregnant  -breast-feeding  How should I use this medicine?  This medicine is for injection into a muscle, slow infusion into a vein, or infusion under the skin. It is usually given by a health care professional in a hospital or clinic setting.  Talk to your pediatrician regarding the use of this medicine in children. While this medicine may be prescribed for children as young as 3 years for selected conditions, precautions do apply.  Patients over 65 years old may have a stronger reaction and need a smaller dose.  Overdosage: If you think you have taken too much of this medicine contact a poison control center or emergency room at once.  NOTE: This medicine is only for you. Do not share this medicine with others.  What if I miss a dose?  This does not apply.  What may interact with this medicine?  Do not take this medicine with any of the following medications:  -preparations containing iron  This medicine may also interact with the following medications:  -ascorbic acid (vitamin C)  -gallium-67 - used in certain diagnostic tests  -prochlorperazine  This list may not describe all possible interactions. Give your health care provider a list of all the medicines, herbs, non-prescription drugs, or dietary supplements you use. Also tell them if you  smoke, drink alcohol, or use illegal drugs. Some items may interact with your medicine.  What should I watch for while using this medicine?  Your condition will be monitored carefully while you are receiving this medicine.  Visit your doctor or health care professional for regular checks on your progress. Tell your doctor or health care professional as soon as you can if you notice any change in your sight or hearing.  You may get drowsy or dizzy or have problems with your vision. Do not drive, use machinery, or do anything that needs mental alertness until you know how this medicine affects you. Do not stand or sit up quickly, especially if you are an older patient. This reduces the risk of dizzy or fainting spells.  While you are receiving this medicine, do not take any vitamin C products unless your doctor or health care professional tells you to.  What side effects may I notice from receiving this medicine?  Side effects that you should report to your doctor or health care professional as soon as possible:  -allergic reactions like skin rash, itching or hives, swelling of the face, lips, or tongue  -breathing problems  -change in vision  -diarrhea  -fast heartbeat  -feel dizzy, faint  -fever  -loss of hearing  -muscle cramps  -pain, swelling where injected  -skin flushing, redness  -stomach pain  Side effects that usually do not require medical attention (report to your doctor or health care professional if they continue or   are bothersome):  -red coloration of urine  This list may not describe all possible side effects. Call your doctor for medical advice about side effects. You may report side effects to FDA at 1-800-FDA-1088.  Where should I keep my medicine?  This drug is given in a hospital or clinic and will not be stored at home.  NOTE: This sheet is a summary. It may not cover all possible information. If you have questions about this medicine, talk to your doctor, pharmacist, or health care provider.    2013, [employer]. (09/20/2007 5:37:06 PM)

## 2012-11-08 NOTE — Progress Notes (Signed)
This office note has been dictated.

## 2012-11-09 ENCOUNTER — Telehealth: Payer: Self-pay | Admitting: Hematology & Oncology

## 2012-11-09 NOTE — Progress Notes (Signed)
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  CURRENT THERAPY: 1. IV fluids as indicated. 2. Exchange transfusions for crises. 3. Exjade 2 g p.o. daily. 4. Folic acid 1 mg p.o. daily. 5. Coumadin 2 mg p.o. daily.  INTERVAL HISTORY:  Christopher Warren comes in for followup.  He has his "ups and downs."  I am still not sure how compliant he is with his medications.  His ferritin last time was 3750.  We are trying to avoid transfusing him because this would worsen issues with iron overload.  He has had no abdominal pain.  He had his spleen taken out back in November.  He had significant splenomegaly.  I thought this would definitely help with crises.  He has had no leg swelling.  He has had no rashes.  He has had no ulcerations in his lower legs.  He has had no headache.  He is still smoking.  PHYSICAL EXAMINATION:  General:  This is a well-developed, well- nourished African American gentleman in no obvious distress.  Vital signs:  Temperature of 99.1, pulse 92, respiratory rate 18, blood pressure 126/42.  Weight is 128.  Head and neck:  Normocephalic, atraumatic skull.  There is no scleral icterus.  He has no adenopathy in the neck.  There are no intraoral lesions.  Lungs:  Clear bilaterally. Cardiac:  Regular rate and rhythm with a normal S1 and S2.  There are no murmurs, rubs, or bruits.  Abdomen:  Soft, with good bowel sounds. There is no palpable abdominal mass.  There is no palpable hepatomegaly. Splenectomy scar is well healed.  Back:  No tenderness over the spine, ribs, or hips.  Extremities:  No clubbing, cyanosis, or edema.  There is good range motion of his joints.  Skin:  No rashes.  Neurological:  No focal neurological deficits.  LABORATORY STUDIES:  White cell count is 14.8, hemoglobin 7.9, hematocrit 22.1, platelet count 484.  The reticulocyte count corrected is about 8.  Sodium __________, potassium 3.9, BUN 10, creatinine 0.67.  His ferritin is  pending.  IMPRESSION:  Christopher Warren is a 37 year old gentleman with hemoglobin SS disease.  I think the issue with Christopher Warren will be and is the iron overload.  I am still not sure if he is taking the Exjade.  There is just really no way for [place] to know this.  We will go ahead with the intravenous fluids today that we typically do for him.  This does seem to make him feel better.  I hope that he is compliant with his medications.  This will definitely be to his benefit and will prolong his survival.  We will plan to get him back to see [place] in another 4-6 weeks.  I am sure that we will hear from him before then.  We will refill his pain medications today.  He is on Exalgo and Dilaudid for short-term relief.    ______________________________ Christopher Warren, M.D. PRE/MEDQ  D:  11/08/2012  T:  11/09/2012  Job:  [id number]

## 2012-11-09 NOTE — Telephone Encounter (Signed)
01/27/2012 06:26 AM Phone (Incoming) Dr. Dierdre Highman in the ED (Other) [phone]  Called to confirm transfer from ED to sickle cell ctr process, advised needs to contact hospitalist for assessment & orders. This RN advised beds are available to transfer patient.    By Jama Flavors, RN

## 2012-11-09 NOTE — Telephone Encounter (Signed)
Left pt message with 7-11 appointment

## 2012-11-13 ENCOUNTER — Encounter (HOSPITAL_COMMUNITY)
Admission: RE | Admit: 2012-11-13 | Discharge: 2012-11-13 | Disposition: A | Discharge status: Home / Self Care | Payer: Medicaid Other | Source: Ambulatory Visit | Attending: Hematology & Oncology | Admitting: Hematology & Oncology

## 2012-11-13 DIAGNOSIS — D571 Sickle-cell disease without crisis: Secondary | ICD-10-CM | POA: Insufficient documentation

## 2012-11-26 ENCOUNTER — Telehealth: Payer: Self-pay | Admitting: *Deleted

## 2012-11-26 NOTE — Telephone Encounter (Signed)
error 

## 2012-11-29 ENCOUNTER — Other Ambulatory Visit: Payer: Self-pay | Admitting: *Deleted

## 2012-11-29 ENCOUNTER — Emergency Department (HOSPITAL_BASED_OUTPATIENT_CLINIC_OR_DEPARTMENT_OTHER)
Admission: EM | Admit: 2012-11-29 | Discharge: 2012-11-29 | Disposition: A | Discharge status: Home / Self Care | Payer: Medicaid Other | Attending: Emergency Medicine | Admitting: Emergency Medicine

## 2012-11-29 ENCOUNTER — Encounter (HOSPITAL_BASED_OUTPATIENT_CLINIC_OR_DEPARTMENT_OTHER): Payer: Self-pay | Admitting: *Deleted

## 2012-11-29 ENCOUNTER — Telehealth: Payer: Self-pay | Admitting: *Deleted

## 2012-11-29 DIAGNOSIS — D57819 Other sickle-cell disorders with crisis, unspecified: Secondary | ICD-10-CM | POA: Insufficient documentation

## 2012-11-29 DIAGNOSIS — D5700 Hb-SS disease with crisis, unspecified: Secondary | ICD-10-CM

## 2012-11-29 DIAGNOSIS — Z79899 Other long term (current) drug therapy: Secondary | ICD-10-CM | POA: Insufficient documentation

## 2012-11-29 DIAGNOSIS — D571 Sickle-cell disease without crisis: Secondary | ICD-10-CM

## 2012-11-29 DIAGNOSIS — R000 Tachycardia, unspecified: Secondary | ICD-10-CM | POA: Insufficient documentation

## 2012-11-29 DIAGNOSIS — Z7901 Long term (current) use of anticoagulants: Secondary | ICD-10-CM | POA: Insufficient documentation

## 2012-11-29 DIAGNOSIS — R011 Cardiac murmur, unspecified: Secondary | ICD-10-CM | POA: Insufficient documentation

## 2012-11-29 DIAGNOSIS — F17200 Nicotine dependence, unspecified, uncomplicated: Secondary | ICD-10-CM | POA: Insufficient documentation

## 2012-11-29 DIAGNOSIS — J45909 Unspecified asthma, uncomplicated: Secondary | ICD-10-CM | POA: Insufficient documentation

## 2012-11-29 LAB — CBC WITH DIFFERENTIAL/PLATELET
Basophils Absolute: 0 10*3/uL (ref 0.0–0.1)
HCT: 20.1 % — ABNORMAL LOW (ref 39.0–52.0)
Lymphocytes Relative: 32 % (ref 12–46)
Monocytes Relative: 11 % (ref 3–12)
Neutro Abs: 8.8 10*3/uL — ABNORMAL HIGH (ref 1.7–7.7)
Neutrophils Relative %: 55 % (ref 43–77)
RDW: 21.3 % — ABNORMAL HIGH (ref 11.5–15.5)
WBC: 16.1 10*3/uL — ABNORMAL HIGH (ref 4.0–10.5)

## 2012-11-29 LAB — BASIC METABOLIC PANEL
CO2: 26 mEq/L (ref 19–32)
Chloride: 107 mEq/L (ref 96–112)
Creatinine, Ser: 0.7 mg/dL (ref 0.50–1.35)
GFR calc Af Amer: >90 mL/min (ref >90)
Potassium: 3.3 mEq/L — ABNORMAL LOW (ref 3.5–5.1)
Sodium: 142 mEq/L (ref 135–145)

## 2012-11-29 LAB — RETICULOCYTES
RBC.: 2.22 MIL/uL — ABNORMAL LOW (ref 4.22–5.81)
Retic Count, Absolute: 557.2 10*3/uL — ABNORMAL HIGH (ref 19.0–186.0)
Retic Ct Pct: 25.1 % — ABNORMAL HIGH (ref 0.4–3.1)

## 2012-11-29 LAB — PROTIME-INR
INR: 1.18 (ref 0.00–1.49)
Prothrombin Time: 14.8 seconds (ref 11.6–15.2)

## 2012-11-29 MED ORDER — ONDANSETRON HCL 4 MG/2ML IJ SOLN
4.0000 mg | Freq: Once | INTRAMUSCULAR | Status: AC
  Administered 2012-11-29: 4 mg via INTRAVENOUS
  Filled 2012-11-29: qty 2

## 2012-11-29 MED ORDER — DIPHENHYDRAMINE HCL 50 MG/ML IJ SOLN
25.0000 mg | Freq: Once | INTRAMUSCULAR | Status: AC
Start: 2012-11-29 — End: 2012-11-29
  Administered 2012-11-29: 25 mg via INTRAVENOUS
  Filled 2012-11-29: qty 1

## 2012-11-29 MED ORDER — HYDROMORPHONE HCL PF 2 MG/ML IJ SOLN
4.0000 mg | Freq: Once | INTRAMUSCULAR | Status: AC
  Administered 2012-11-29: 4 mg via INTRAVENOUS
  Filled 2012-11-29 (×2): qty 1

## 2012-11-29 MED ORDER — HEPARIN SOD (PORK) LOCK FLUSH 100 UNIT/ML IV SOLN
INTRAVENOUS | Status: AC
  Filled 2012-11-29: qty 5

## 2012-11-29 MED ORDER — DIPHENHYDRAMINE HCL 50 MG/ML IJ SOLN
12.5000 mg | Freq: Once | INTRAMUSCULAR | Status: AC
  Administered 2012-11-29: 12.5 mg via INTRAVENOUS
  Filled 2012-11-29: qty 1

## 2012-11-29 MED ORDER — HYDROMORPHONE HCL PF 1 MG/ML IJ SOLN
INTRAMUSCULAR | Status: AC
  Filled 2012-11-29: qty 1

## 2012-11-29 MED ORDER — DIPHENHYDRAMINE HCL 50 MG/ML IJ SOLN
12.5000 mg | Freq: Once | INTRAMUSCULAR | Status: AC
  Administered 2012-11-29: 12.5 mg via INTRAVENOUS

## 2012-11-29 MED ORDER — DIPHENHYDRAMINE HCL 50 MG/ML IJ SOLN
INTRAMUSCULAR | Status: AC
  Filled 2012-11-29: qty 1

## 2012-11-29 MED ORDER — HYDROMORPHONE HCL PF 2 MG/ML IJ SOLN
4.0000 mg | Freq: Once | INTRAMUSCULAR | Status: AC
  Administered 2012-11-29: 4 mg via INTRAVENOUS
  Filled 2012-11-29: qty 2

## 2012-11-29 NOTE — ED Notes (Signed)
In to assess the Pt.  Pt. Sitting in chair and would not look up or speak to the RN asking the Pt. Questions.  Pt. Reports he doesn't want to sit in the Bed.  RN explained to the Pt. He needs to sit there in order for his port to be accessed.  Pt. Sits half on the bed and half off the bed and reports to RN  I am not putting on the gown.  RN told Pt. That was fine but he still needed to get into the bed.  Pt. Placed his blanket over himself and continued to sit on the side of the bed.  RN explained to Consulting civil engineer that the Pt. Is being difficult.  Charge RN Ola Spurr in to access Pt. Port and to give meds to Pt.

## 2012-11-29 NOTE — ED Notes (Signed)
Back  In to see Pt. Due to meds being given earlier.  Pt. Still complains of pain and tearful.  RN to give more meds ordered by EDP

## 2012-11-29 NOTE — ED Notes (Signed)
Back pain. States his sickle cell is bothering him.

## 2012-11-29 NOTE — ED Notes (Signed)
Pt. Girlfriend asking if he can go home.   Have encouraged Pt. To leave the Oxygen N/C on at all times and Pt. Continues to take it off.  Pt. Sat on RA is 91%

## 2012-11-29 NOTE — Telephone Encounter (Signed)
Spoke to pt, he wants to come in tomorrow for IVFs and pain medication. To come in at 0915 for lab followed by 0930 infusion.

## 2012-11-29 NOTE — ED Provider Notes (Signed)
History     CSN: [id number]  Arrival date & time 11/29/12  1704   First MD Initiated Contact with Patient 11/29/12 1715      Chief Complaint  Patient presents with  . Sickle Cell Pain Crisis    (Consider location/radiation/quality/duration/timing/severity/associated sxs/prior treatment) Patient is a 37 y.o. male presenting with sickle cell pain. The history is provided by the patient.  Sickle Cell Pain Crisis Location:  Back Severity:  Severe Onset quality:  Gradual Duration:  1 day Similar to previous crisis episodes: yes   Timing:  Constant Progression:  Worsening Chronicity:  Recurrent Sickle cell genotype:  SS Usual hemoglobin level:  8-9 Date of last transfusion:  2 weeks ago History of pulmonary emboli: no   Context: not change in medication, not cold exposure, not dehydration, not infection and not non-compliance   Relieved by:  Nothing Worsened by:  Activity and movement Ineffective treatments:  Hydroxyurea and prescription drugs Associated symptoms: no chest pain, no congestion, no cough, no fever, no nausea, no priapism, no shortness of breath, no swelling of legs and no vomiting   Associated symptoms comment:  States temp of 99 Risk factors: cholecystectomy and smoking   Risk factors: no hx of stroke and no history of acute chest syndrome   Risk factors comment:  Splenectomy   Past Medical History  Diagnosis Date  . Sickle cell anemia   . Asthma   . 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 Heart murmur     Past Surgical History  Procedure Laterality Date  . Cholecystectomy    . 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 Sickle cell anemia Mother   . Sickle cell anemia Son     History  Substance Use Topics  . Smoking status: Current Every Day Smoker -- 1.00 packs/day for 5 years    Types:  Cigarettes  . Smokeless tobacco: Never Used  . Alcohol Use: No      Review of Systems  Constitutional: Negative for fever.  HENT: Negative for congestion.   Respiratory: Negative for cough and shortness of breath.   Cardiovascular: Negative for chest pain.  Gastrointestinal: Negative for nausea and vomiting.  Genitourinary: Negative for dysuria, hematuria and penile pain.  All other systems reviewed and are negative.    Allergies  Morphine and related; Adhesive; and Latex  Home Medications   Current Outpatient Rx  Name  Route  Sig  Dispense  Refill  . albuterol (PROVENTIL HFA;VENTOLIN HFA) 108 (90 BASE) MCG/ACT inhaler   Inhalation   Inhale 2 puffs into the lungs every 6 (six) hours as needed for wheezing.   2 Inhaler   4   . deferasirox (EXJADE) 500 MG disintegrating tablet   Oral   Take 4 tablets (2,000 mg total) by mouth daily.   120 tablet   6   . diphenhydrAMINE (BENADRYL) 25 mg capsule   Oral   Take 25 mg by mouth every 6 (six) hours as needed. Itching.         . folic acid (FOLVITE) 1 MG tablet      Take 2 pills a day.   180 tablet   3   . HYDROmorphone (DILAUDID) 8 MG tablet   Oral   Take 1 tablet (8 mg total) by mouth every 6 (six) hours as needed. Please  fill on 5/29   120 tablet   0   . HYDROmorphone HCl 16 MG T24A   Oral   Take 1 tablet (16 mg total) by mouth daily. Please fill on 5/29.   30 tablet   0   . hydroxyurea (HYDREA) 500 MG capsule   Oral   Take 1 capsule (500 mg total) by mouth 3 (three) times daily.   90 capsule   1   . promethazine (PHENERGAN) 6.25 MG/5ML syrup   Oral   Take 10 mLs (12.5 mg total) by mouth 4 (four) times daily as needed for nausea.   960 mL   3   . temazepam (RESTORIL) 15 MG capsule   Oral   Take 1 capsule (15 mg total) by mouth at bedtime as needed for sleep.   30 capsule   2   . warfarin (COUMADIN) 2 MG tablet   Oral   Take 1 tablet (2 mg total) by mouth daily.   30 tablet   6     BP  115/47  Pulse 101  Temp(Src) 99.8 F (37.7 C) (Oral)  Resp 18  Wt 128 lb (58.06 kg)  BMI 21.3 kg/m2  SpO2 90%  Physical Exam  Nursing note and vitals reviewed. Constitutional: He is oriented to person, place, and time. He appears well-developed and well-nourished. He appears distressed.  HENT:  Head: Normocephalic and atraumatic.  Mouth/Throat: Oropharynx is clear and moist.  Eyes: Conjunctivae and EOM are normal. Pupils are equal, round, and reactive to light.  Neck: Normal range of motion. Neck supple.  Cardiovascular: Regular rhythm and intact distal pulses.  Tachycardia present.   Murmur heard. Pulmonary/Chest: Effort normal and breath sounds normal. No respiratory distress. He has no wheezes. He has no rales.  Abdominal: Soft. He exhibits no distension. There is no tenderness. There is no rebound and no guarding.  Musculoskeletal: Normal range of motion. He exhibits no edema and no tenderness.       Lumbar back: He exhibits tenderness and bony tenderness. He exhibits no deformity.  No swelling or pain in the legs.  No joint swelling  Neurological: He is alert and oriented to person, place, and time.  Skin: Skin is warm and dry. No rash noted. No erythema.  Psychiatric: He has a normal mood and affect. His behavior is normal.    ED Course  Procedures (including critical care time)  Labs Reviewed  CBC WITH DIFFERENTIAL - Abnormal; Notable for the following:    WBC 16.1 (*)    RBC 2.17 (*)    Hemoglobin 7.5 (*)    HCT 20.1 (*)    MCH 34.6 (*)    MCHC 37.3 (*)    RDW 21.3 (*)    Platelets 450 (*)    Neutro Abs 8.8 (*)    Lymphs Abs 5.2 (*)    Monocytes Absolute 1.8 (*)    All other components within normal limits  BASIC METABOLIC PANEL - Abnormal; Notable for the following:    Potassium 3.3 (*)    Glucose, Bld 109 (*)    All other components within normal limits  PROTIME-INR  RETICULOCYTES   No results found.   1. Sickle cell pain crisis       MDM    Patient with history of sickle cell disease who presented to 2 sickle cell crisis with pain in the lower lumbar region. He states that typically place he gets his crises. His doctor is Dr. Myna Hidalgo.  He has been  trying to take his Dilaudid at home without improvement. He states his last transfusion was 2 weeks ago and at that time he also got an iron binding infusion as well because he Exjade is not completely removing the iron that he gets with transfusions. He denies any infectious etiologies, urinary problems or penile discharge. No recent trauma. He denies chest pain, cough or shortness of breath.  He was unable to go to the clinic today and they advised he come here for pain control.  CBC, BMP, reticulocyte count until I pending. Patient started with 4 mg of Dilaudid IV with Zofran and Benadryl   7:11 PM Pt with stable hb of 7.9 and leukocytosis of 16,000.  Retic count pending and BMP wnl.  After 2 rounds of 4mg  of benadryl pt states still having pain but mild improvement.  He wants to try one more dose and feels he will be able to go home.  8:47 PM Pt feeling better and wants to go home.    Gwyneth Sprout, MD 11/29/12 2048

## 2012-11-30 ENCOUNTER — Other Ambulatory Visit (HOSPITAL_BASED_OUTPATIENT_CLINIC_OR_DEPARTMENT_OTHER): Payer: Medicaid Other | Admitting: Lab

## 2012-11-30 ENCOUNTER — Encounter: Payer: Self-pay | Admitting: *Deleted

## 2012-11-30 ENCOUNTER — Telehealth: Payer: Self-pay | Admitting: Hematology & Oncology

## 2012-11-30 ENCOUNTER — Ambulatory Visit (HOSPITAL_BASED_OUTPATIENT_CLINIC_OR_DEPARTMENT_OTHER): Payer: Medicaid Other

## 2012-11-30 ENCOUNTER — Other Ambulatory Visit: Payer: Self-pay | Admitting: *Deleted

## 2012-11-30 DIAGNOSIS — D571 Sickle-cell disease without crisis: Secondary | ICD-10-CM

## 2012-11-30 DIAGNOSIS — D5700 Hb-SS disease with crisis, unspecified: Secondary | ICD-10-CM

## 2012-11-30 LAB — CBC WITH DIFFERENTIAL (CANCER CENTER ONLY)
BASO%: 0.4 % (ref 0.0–2.0)
EOS%: 1.5 % (ref 0.0–7.0)
LYMPH#: 4.9 10*3/uL — ABNORMAL HIGH (ref 0.9–3.3)
MCH: 34.8 pg — ABNORMAL HIGH (ref 28.0–33.4)
MCHC: 36.9 g/dL — ABNORMAL HIGH (ref 32.0–35.9)
MONO%: 11.1 % (ref 0.0–13.0)
NEUT#: 14.5 10*3/uL — ABNORMAL HIGH (ref 1.5–6.5)
Platelets: 502 10*3/uL — ABNORMAL HIGH (ref 145–400)
RDW: 21 % — ABNORMAL HIGH (ref 11.1–15.7)

## 2012-11-30 LAB — TYPE AND SCREEN: Antibody Screen: NEGATIVE

## 2012-11-30 LAB — HOLD TUBE, BLOOD BANK - CHCC SATELLITE

## 2012-11-30 LAB — SAMPLE TO BLOOD BANK

## 2012-11-30 MED ORDER — DIPHENHYDRAMINE HCL 50 MG/ML IJ SOLN
25.0000 mg | Freq: Once | INTRAMUSCULAR | Status: AC
  Administered 2012-11-30: 25 mg via INTRAVENOUS

## 2012-11-30 MED ORDER — SODIUM CHLORIDE 0.9 % IV SOLN
Freq: Once | INTRAVENOUS | Status: AC
  Administered 2012-11-30: 10:00:00 via INTRAVENOUS

## 2012-11-30 MED ORDER — SODIUM CHLORIDE 0.9 % IJ SOLN
3.0000 mL | INTRAMUSCULAR | Status: AC | PRN
  Administered 2012-11-30: 3 mL
  Filled 2012-11-30: qty 10

## 2012-11-30 MED ORDER — HYDROMORPHONE HCL PF 4 MG/ML IJ SOLN
8.0000 mg | Freq: Once | INTRAMUSCULAR | Status: AC
  Administered 2012-11-30: 8 mg via INTRAVENOUS

## 2012-11-30 MED ORDER — HEPARIN SOD (PORK) LOCK FLUSH 100 UNIT/ML IV SOLN
250.0000 [IU] | INTRAVENOUS | Status: AC | PRN
  Administered 2012-11-30: 250 [IU]
  Filled 2012-11-30: qty 5

## 2012-11-30 MED ORDER — PROMETHAZINE HCL 25 MG/ML IJ SOLN
12.5000 mg | Freq: Once | INTRAMUSCULAR | Status: AC
  Administered 2012-11-30: 12.5 mg via INTRAVENOUS

## 2012-11-30 MED ORDER — SODIUM CHLORIDE 0.9 % IV SOLN: 250.0000 mL | Freq: Once | INTRAVENOUS | Status: DC

## 2012-11-30 NOTE — Telephone Encounter (Signed)
Per list moved pt to 7-23 from 7-11 Amy RN aware it is 2 weeks out.

## 2012-12-03 ENCOUNTER — Telehealth: Payer: Self-pay | Admitting: Nurse Practitioner

## 2012-12-03 ENCOUNTER — Other Ambulatory Visit: Payer: Self-pay | Admitting: Medical

## 2012-12-03 ENCOUNTER — Other Ambulatory Visit: Payer: Self-pay | Admitting: *Deleted

## 2012-12-03 ENCOUNTER — Telehealth: Payer: Self-pay | Admitting: Hematology & Oncology

## 2012-12-03 NOTE — Progress Notes (Signed)
Error

## 2012-12-03 NOTE — Telephone Encounter (Signed)
Late Entry from 11/30/12: Pt was aware at the time of his visit that Dr. Arlan Organ was out of the office and French Ana, NP was managing the nursing and orders as needed. He was instructed that we had orders in for his current visit and he verbalized understanding. Later doing his visit around 1300 pt requested additional pain medication. Instructed him he was given his ordered dosing already. Stated he still was in pain and that he wanted something else and requested the nursing staff to ask French Ana, NP. Upon asking French Ana for additional orders for the IV Dilaudid she stated she was not going to ordered and additional dose as the patient already received a total of 8mg  IV of Dilaudid, the pt was also in the [facility] ER the night before and received IVF, antiemetics, and IV pain medication. The patient was informed that he could not receive additional medication. He stated he needed to use the restroom, and he was allowed, however, he went into Tracy's office and requested additional pain medication and when he was informed he could not receive anymore medications the patient became very hostile and upset. He began cursing the nursing staff and French Ana. Stating that "he did not trust the staff, and if he was in pain he didn't understand why he couldn't have more pain medication". The patient was also asked about his previous ER visit and what was the outcome he responded " he did not need to tell anyone anything that was his business". Alvino Chapel, RN tried to politely explain that to coordinate his care we really should be aware of any treatments as it related to his SCC disease and care. The patient continued to be hostile and stating inappropriate comments to the nursing staff. He requested his port be taken out so that he could leave. He was successfully accessed as requested. The patient continued to make threatening comments at discharge and entered the lab requesting information from [facility]. Gwen, Lab tech was servicing  another patient and Mr. Devonshire was requested to wait in the lobby as there was another patient receiving care. He refused to leave out and continue cursing and making harmful comments in the presence of other patients in regards and toward the staff. Security was called and prior to their arrival patient did go and sit in the lobby. Security escorted patient out of the office. Pt continued to be lowed and expressing harmful inappropriate comments in the process of leaving the office.

## 2012-12-03 NOTE — Telephone Encounter (Signed)
Per RN cx all patients appointments

## 2012-12-05 ENCOUNTER — Other Ambulatory Visit: Payer: Self-pay | Admitting: Hematology & Oncology

## 2012-12-05 ENCOUNTER — Telehealth (HOSPITAL_COMMUNITY): Payer: Self-pay

## 2012-12-05 DIAGNOSIS — D571 Sickle-cell disease without crisis: Secondary | ICD-10-CM

## 2012-12-05 DIAGNOSIS — D5700 Hb-SS disease with crisis, unspecified: Secondary | ICD-10-CM

## 2012-12-05 MED ORDER — HYDROMORPHONE HCL 8 MG PO TABS: 8.0000 mg | ORAL_TABLET | Freq: Four times a day (QID) | ORAL | Status: DC | PRN

## 2012-12-05 MED ORDER — HYDROXYUREA 500 MG PO CAPS: 500.0000 mg | ORAL_CAPSULE | Freq: Three times a day (TID) | ORAL | Status: DC

## 2012-12-05 MED ORDER — DEFERASIROX 500 MG PO TBSO: 2000.0000 mg | ORAL_TABLET | Freq: Every day | ORAL | Status: DC

## 2012-12-05 MED ORDER — HYDROMORPHONE HCL ER 16 MG PO T24A: 16.0000 mg | EXTENDED_RELEASE_TABLET | ORAL | Status: DC

## 2012-12-06 NOTE — Telephone Encounter (Signed)
Patient called inquiring on becoming a new patient

## 2012-12-10 ENCOUNTER — Inpatient Hospital Stay (HOSPITAL_COMMUNITY)
Admission: EM | Admit: 2012-12-10 | Discharge: 2012-12-17 | DRG: 871 | Disposition: A | Discharge status: Home / Self Care | Payer: Medicaid Other | Attending: Internal Medicine | Admitting: Internal Medicine

## 2012-12-10 ENCOUNTER — Emergency Department (HOSPITAL_COMMUNITY): Payer: Medicaid Other

## 2012-12-10 DIAGNOSIS — Z91040 Latex allergy status: Secondary | ICD-10-CM

## 2012-12-10 DIAGNOSIS — I809 Phlebitis and thrombophlebitis of unspecified site: Secondary | ICD-10-CM

## 2012-12-10 DIAGNOSIS — Z9119 Patient's noncompliance with other medical treatment and regimen: Secondary | ICD-10-CM

## 2012-12-10 DIAGNOSIS — D5700 Hb-SS disease with crisis, unspecified: Secondary | ICD-10-CM | POA: Diagnosis present

## 2012-12-10 DIAGNOSIS — D72829 Elevated white blood cell count, unspecified: Secondary | ICD-10-CM | POA: Diagnosis present

## 2012-12-10 DIAGNOSIS — R011 Cardiac murmur, unspecified: Secondary | ICD-10-CM | POA: Diagnosis present

## 2012-12-10 DIAGNOSIS — Z113 Encounter for screening for infections with a predominantly sexual mode of transmission: Secondary | ICD-10-CM

## 2012-12-10 DIAGNOSIS — I82C11 Acute embolism and thrombosis of right internal jugular vein: Secondary | ICD-10-CM

## 2012-12-10 DIAGNOSIS — Z79899 Other long term (current) drug therapy: Secondary | ICD-10-CM

## 2012-12-10 DIAGNOSIS — A419 Sepsis, unspecified organism: Principal | ICD-10-CM | POA: Diagnosis present

## 2012-12-10 DIAGNOSIS — R509 Fever, unspecified: Secondary | ICD-10-CM | POA: Diagnosis present

## 2012-12-10 DIAGNOSIS — Z9089 Acquired absence of other organs: Secondary | ICD-10-CM

## 2012-12-10 DIAGNOSIS — I808 Phlebitis and thrombophlebitis of other sites: Secondary | ICD-10-CM | POA: Diagnosis present

## 2012-12-10 DIAGNOSIS — F17200 Nicotine dependence, unspecified, uncomplicated: Secondary | ICD-10-CM | POA: Diagnosis present

## 2012-12-10 DIAGNOSIS — J45909 Unspecified asthma, uncomplicated: Secondary | ICD-10-CM | POA: Diagnosis present

## 2012-12-10 DIAGNOSIS — Z7901 Long term (current) use of anticoagulants: Secondary | ICD-10-CM

## 2012-12-10 DIAGNOSIS — I82C19 Acute embolism and thrombosis of unspecified internal jugular vein: Secondary | ICD-10-CM | POA: Diagnosis present

## 2012-12-10 DIAGNOSIS — D571 Sickle-cell disease without crisis: Secondary | ICD-10-CM

## 2012-12-10 DIAGNOSIS — D691 Qualitative platelet defects: Secondary | ICD-10-CM

## 2012-12-10 DIAGNOSIS — Z91199 Patient's noncompliance with other medical treatment and regimen due to unspecified reason: Secondary | ICD-10-CM

## 2012-12-10 DIAGNOSIS — I82890 Acute embolism and thrombosis of other specified veins: Secondary | ICD-10-CM

## 2012-12-10 DIAGNOSIS — T80219A Unspecified infection due to central venous catheter, initial encounter: Secondary | ICD-10-CM

## 2012-12-10 DIAGNOSIS — R5081 Fever presenting with conditions classified elsewhere: Secondary | ICD-10-CM | POA: Diagnosis present

## 2012-12-10 DIAGNOSIS — Z885 Allergy status to narcotic agent status: Secondary | ICD-10-CM

## 2012-12-10 LAB — BASIC METABOLIC PANEL
BUN: 8 mg/dL (ref 6–23)
CO2: 27 mEq/L (ref 19–32)
Glucose, Bld: 112 mg/dL — ABNORMAL HIGH (ref 70–99)
Potassium: 3.6 mEq/L (ref 3.5–5.1)
Sodium: 138 mEq/L (ref 135–145)

## 2012-12-10 LAB — CBC WITH DIFFERENTIAL/PLATELET
Basophils Relative: 0 % (ref 0–1)
Eosinophils Absolute: 0 10*3/uL (ref 0.0–0.7)
Eosinophils Relative: 0 % (ref 0–5)
HCT: 19.8 % — ABNORMAL LOW (ref 39.0–52.0)
Hemoglobin: 7.5 g/dL — ABNORMAL LOW (ref 13.0–17.0)
Lymphocytes Relative: 11 % — ABNORMAL LOW (ref 12–46)
MCH: 34.2 pg — ABNORMAL HIGH (ref 26.0–34.0)
MCHC: 37.9 g/dL — ABNORMAL HIGH (ref 30.0–36.0)
Neutro Abs: 28.8 10*3/uL — ABNORMAL HIGH (ref 1.7–7.7)
Neutrophils Relative %: 80 % — ABNORMAL HIGH (ref 43–77)
RBC: 2.19 MIL/uL — ABNORMAL LOW (ref 4.22–5.81)

## 2012-12-10 LAB — PROTIME-INR
INR: 1.22 (ref 0.00–1.49)
Prothrombin Time: 15.1 seconds (ref 11.6–15.2)

## 2012-12-10 MED ORDER — HYDROMORPHONE HCL PF 1 MG/ML IJ SOLN
1.0000 mg | Freq: Once | INTRAMUSCULAR | Status: AC
  Administered 2012-12-10: 1 mg via INTRAVENOUS
  Filled 2012-12-10: qty 1

## 2012-12-10 MED ORDER — SODIUM CHLORIDE 0.9 % IV BOLUS (SEPSIS)
1000.0000 mL | Freq: Once | INTRAVENOUS | Status: AC
  Administered 2012-12-10: 1000 mL via INTRAVENOUS

## 2012-12-10 MED ORDER — VANCOMYCIN HCL IN DEXTROSE 750-5 MG/150ML-% IV SOLN
750.0000 mg | Freq: Three times a day (TID) | INTRAVENOUS | Status: DC
  Filled 2012-12-10 (×3): qty 150

## 2012-12-10 MED ORDER — DIPHENHYDRAMINE HCL 50 MG/ML IJ SOLN
25.0000 mg | Freq: Once | INTRAMUSCULAR | Status: AC
  Administered 2012-12-10: 25 mg via INTRAVENOUS
  Filled 2012-12-10: qty 1

## 2012-12-10 MED ORDER — HYDROMORPHONE HCL PF 2 MG/ML IJ SOLN
4.0000 mg | Freq: Once | INTRAMUSCULAR | Status: AC
  Administered 2012-12-10: 4 mg via INTRAVENOUS
  Filled 2012-12-10: qty 2

## 2012-12-10 MED ORDER — HEPARIN BOLUS VIA INFUSION
2500.0000 [IU] | Freq: Once | INTRAVENOUS | Status: AC
  Administered 2012-12-11: 2500 [IU] via INTRAVENOUS

## 2012-12-10 MED ORDER — HYDROMORPHONE HCL PF 2 MG/ML IJ SOLN
2.0000 mg | Freq: Once | INTRAMUSCULAR | Status: AC
  Administered 2012-12-10: 2 mg via INTRAVENOUS
  Filled 2012-12-10: qty 1

## 2012-12-10 MED ORDER — ONDANSETRON HCL 4 MG/2ML IJ SOLN
4.0000 mg | Freq: Once | INTRAMUSCULAR | Status: AC
  Administered 2012-12-10: 4 mg via INTRAVENOUS
  Filled 2012-12-10: qty 2

## 2012-12-10 MED ORDER — PROMETHAZINE HCL 25 MG/ML IJ SOLN
25.0000 mg | Freq: Once | INTRAMUSCULAR | Status: AC
  Administered 2012-12-10: 25 mg via INTRAVENOUS
  Filled 2012-12-10: qty 1

## 2012-12-10 MED ORDER — HEPARIN (PORCINE) IN NACL 100-0.45 UNIT/ML-% IJ SOLN
1200.0000 [IU]/h | INTRAMUSCULAR | Status: DC
  Administered 2012-12-11: 1200 [IU]/h via INTRAVENOUS
  Filled 2012-12-10: qty 250

## 2012-12-10 MED ORDER — IOHEXOL 300 MG/ML  SOLN
80.0000 mL | Freq: Once | INTRAMUSCULAR | Status: AC | PRN
  Administered 2012-12-10: 80 mL via INTRAVENOUS

## 2012-12-10 NOTE — ED Provider Notes (Signed)
History    CSN: [id number] Arrival date & time 12/10/12  1813  First MD Initiated Contact with Patient 12/10/12 1841     Chief Complaint  Patient presents with  . Sickle Cell Pain Crisis  . Fever   (Consider location/radiation/quality/duration/timing/severity/associated sxs/prior Treatment) HPI Comments: The patient presents to ER with complaints of sickle cell crisis. Patient is complaining of pain in his joints, especially lower back. Patient reports that the pain is severe, not helped with his pain medication. Patient reports that the pain is similar to previous sickle cell crisis episodes he has had in the past.  Patient also reports that he has been experiencing pain and swelling on the right side of his neck to the right shoulder area. He has been experiencing intermittent fevers. Patient denies sore throat and trouble swallowing. The area is tender to touch. No overlying rash.  Patient is a 37 y.o. male presenting with sickle cell pain and fever.  Sickle Cell Pain Crisis Associated symptoms: fever   Associated symptoms: no chest pain and no shortness of breath   Fever Associated symptoms: no chest pain    Past Medical History  Diagnosis Date  . Sickle cell anemia   . Asthma   . 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 Heart murmur    Past Surgical History  Procedure Laterality Date  . Cholecystectomy    . 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 Sickle cell anemia Mother   . Sickle cell anemia Son    History  Substance Use Topics  . Smoking status: Current Every Day Smoker -- 1.00 packs/day for 5 years    Types: Cigarettes  . Smokeless tobacco: Never Used  . Alcohol Use: No    Review of Systems  Constitutional: Positive for fever.  HENT: Positive for neck pain.   Respiratory: Negative for shortness of  breath.   Cardiovascular: Negative for chest pain.  Musculoskeletal: Positive for back pain and arthralgias.  All other systems reviewed and are negative.    Allergies  Morphine and related; Adhesive; and Latex  Home Medications   Current Outpatient Rx  Name  Route  Sig  Dispense  Refill  . albuterol (PROVENTIL HFA;VENTOLIN HFA) 108 (90 BASE) MCG/ACT inhaler   Inhalation   Inhale 2 puffs into the lungs every 6 (six) hours as needed for wheezing.   2 Inhaler   4   . deferasirox (EXJADE) 500 MG disintegrating tablet   Oral   Take 4 tablets (2,000 mg total) by mouth daily.   120 tablet   6   . diphenhydrAMINE (BENADRYL) 25 mg capsule   Oral   Take 25 mg by mouth every 6 (six) hours as needed. Itching.         . folic acid (FOLVITE) 1 MG tablet      Take 2 pills a day.   180 tablet   3   . HYDROmorphone (DILAUDID) 8 MG tablet   Oral   Take 1 tablet (8 mg total) by mouth every 6 (six) hours as needed. Please fill on 5/29   120 tablet   0   . HYDROmorphone HCl 16 MG T24A   Oral   Take 1 tablet (16 mg total) by mouth daily. Please fill on 5/29.   30 tablet   0   .  hydroxyurea (HYDREA) 500 MG capsule   Oral   Take 1 capsule (500 mg total) by mouth 3 (three) times daily.   90 capsule   1   . promethazine (PHENERGAN) 6.25 MG/5ML syrup   Oral   Take 10 mLs (12.5 mg total) by mouth 4 (four) times daily as needed for nausea.   960 mL   3   . temazepam (RESTORIL) 15 MG capsule   Oral   Take 1 capsule (15 mg total) by mouth at bedtime as needed for sleep.   30 capsule   2   . warfarin (COUMADIN) 2 MG tablet   Oral   Take 1 tablet (2 mg total) by mouth daily.   30 tablet   6    BP 132/64  Pulse 101  Temp(Src) 100.5 F (38.1 C) (Oral)  Resp 17  SpO2 93% Physical Exam  Constitutional: He is oriented to person, place, and time. He appears well-developed and well-nourished. No distress.  HENT:  Head: Normocephalic and atraumatic.    Right Ear:  Hearing normal.  Left Ear: Hearing normal.  Nose: Nose normal.  Mouth/Throat: Oropharynx is clear and moist and mucous membranes are normal.  Eyes: Conjunctivae and EOM are normal. Pupils are equal, round, and reactive to light.  Neck: Normal range of motion. Neck supple.  Cardiovascular: Regular rhythm, S1 normal and S2 normal.  Exam reveals no gallop and no friction rub.   No murmur heard. Pulmonary/Chest: Effort normal and breath sounds normal. No respiratory distress. He exhibits no tenderness.  Abdominal: Soft. Normal appearance and bowel sounds are normal. There is no hepatosplenomegaly. There is no tenderness. There is no rebound, no guarding, no tenderness at McBurney's point and negative Murphy's sign. No hernia.  Musculoskeletal: Normal range of motion.  Neurological: He is alert and oriented to person, place, and time. He has normal strength. No cranial nerve deficit or sensory deficit. Coordination normal. GCS eye subscore is 4. GCS verbal subscore is 5. GCS motor subscore is 6.  Skin: Skin is warm, dry and intact. No rash noted. No cyanosis.  Psychiatric: He has a normal mood and affect. His speech is normal and behavior is normal. Thought content normal.    ED Course  Procedures (including critical care time) Labs Reviewed  CBC WITH DIFFERENTIAL - Abnormal; Notable for the following:    WBC 36.0 (*)    RBC 2.19 (*)    Hemoglobin 7.5 (*)    HCT 19.8 (*)    MCH 34.2 (*)    MCHC 37.9 (*)    RDW 20.6 (*)    Platelets 477 (*)    Neutrophils Relative % 80 (*)    Lymphocytes Relative 11 (*)    Neutro Abs 28.8 (*)    Monocytes Absolute 3.2 (*)    All other components within normal limits  BASIC METABOLIC PANEL - Abnormal; Notable for the following:    Glucose, Bld 112 (*)    All other components within normal limits  RETICULOCYTES - Abnormal; Notable for the following:    Retic Ct Pct 18.8 (*)    RBC. 2.19 (*)    Retic Count, Manual 411.7 (*)    All other components  within normal limits  RAPID STREP SCREEN  CULTURE, BLOOD (ROUTINE X 2)  CULTURE, BLOOD (ROUTINE X 2)  CULTURE, GROUP A STREP   Ct Soft Tissue Neck W Contrast  12/10/2012   *RADIOLOGY REPORT*  Clinical Data: The neck swelling, pain  CT NECK WITH CONTRAST  Technique:  Multidetector CT imaging of the neck was performed with intravenous contrast.  Contrast: 80mL OMNIPAQUE IOHEXOL 300 MG/ML  SOLN  Comparison: Prior radiograph from 10/05/2012.  Findings: The visualized portions of the intracranial brain are unremarkable.  The orbits are unremarkable.  The paranasal sinuses are clear.  The oral cavity, oropharynx, and nasopharynx is unremarkable. [place] and hypopharynx is unremarkable.  The thyroid is normal.  No pathologically enlarged lymph nodes are identified within the neck.  A filling defect is seen within the mildly expanded right internal jugular vein, proximal to the insertion of the right-sided Port-A- Cath (series 6, image 56). Distally, the right internal jugular vein appears completely occluded.  There is associated inflammatory stranding within the soft tissues around the occluded right internal jugular vein in the lower right neck, deep to the right sternocleidomastoid muscle.  The right common, internal, and external carotid arteries are normal. The left internal jugular vein is patent.  The partially visualized lungs are clear.  No osseous abnormalities are identified.  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   Original Report Authenticated By: Rise Mu, M.D.   Diagnosis: 1. Right jugular vein thrombus 2. Sickle cell crisis  MDM  Patient presents to the ER for evaluation of sickle cell crisis. Patient reports generalized joint pain with specific pain in the low back, consistent with his history of sickle cell crisis. Patient does  not appear to be significantly anemic, reticulocyte count is normal. No concern for acute chest. Patient treated with fluids, oxygen and IV analgesia.  Patient also complaining of pain and swelling of the right side of his neck. He did have some distention of the external jugular vein. Patient has a port in the right subclavian vein. He is supposed to be taking Coumadin but admits to missing multiple doses. CT scan of the neck confirms occlusive thrombus in the right internal jugular vein associated with inflammatory stranding of the neck which explains his symptoms.  Patient will require hospitalization for further management of sickle cell crisis as well as his internal jugular clot. Patient will be initiated on IV heparin, and admitted the hospital. D/W Dr. Blake Divine.  Gilda Crease, MD 12/10/12 2232

## 2012-12-10 NOTE — ED Notes (Signed)
Pt  Reports generalized pain in joints, fever, swelling and tenderness on r/side of neck above portacath. Increased pain over last 3 days

## 2012-12-10 NOTE — ED Notes (Signed)
PT ambulated to CT with steady gait.

## 2012-12-11 ENCOUNTER — Encounter (HOSPITAL_COMMUNITY): Payer: Self-pay | Admitting: *Deleted

## 2012-12-11 ENCOUNTER — Inpatient Hospital Stay (HOSPITAL_COMMUNITY): Payer: Medicaid Other

## 2012-12-11 ENCOUNTER — Encounter (HOSPITAL_COMMUNITY): Payer: Medicaid Other | Attending: Hematology & Oncology

## 2012-12-11 DIAGNOSIS — D72829 Elevated white blood cell count, unspecified: Secondary | ICD-10-CM

## 2012-12-11 DIAGNOSIS — D5700 Hb-SS disease with crisis, unspecified: Secondary | ICD-10-CM

## 2012-12-11 DIAGNOSIS — I82C19 Acute embolism and thrombosis of unspecified internal jugular vein: Secondary | ICD-10-CM | POA: Diagnosis present

## 2012-12-11 DIAGNOSIS — I809 Phlebitis and thrombophlebitis of unspecified site: Secondary | ICD-10-CM

## 2012-12-11 DIAGNOSIS — D691 Qualitative platelet defects: Secondary | ICD-10-CM

## 2012-12-11 DIAGNOSIS — D571 Sickle-cell disease without crisis: Secondary | ICD-10-CM | POA: Insufficient documentation

## 2012-12-11 DIAGNOSIS — Z113 Encounter for screening for infections with a predominantly sexual mode of transmission: Secondary | ICD-10-CM

## 2012-12-11 LAB — HEPATIC FUNCTION PANEL
ALT: 17 U/L (ref 0–53)
AST: 32 U/L (ref 0–37)
Albumin: 3.6 g/dL (ref 3.5–5.2)

## 2012-12-11 LAB — HEPARIN LEVEL (UNFRACTIONATED): Heparin Unfractionated: 0.28 IU/mL — ABNORMAL LOW (ref 0.30–0.70)

## 2012-12-11 LAB — BASIC METABOLIC PANEL
BUN: 8 mg/dL (ref 6–23)
Calcium: 8.8 mg/dL (ref 8.4–10.5)
Creatinine, Ser: 0.61 mg/dL (ref 0.50–1.35)
GFR calc non Af Amer: >90 mL/min (ref >90)
Glucose, Bld: 92 mg/dL (ref 70–99)

## 2012-12-11 LAB — PROCALCITONIN: Procalcitonin: 0.11 ng/mL

## 2012-12-11 LAB — CBC
Hemoglobin: 7.1 g/dL — ABNORMAL LOW (ref 13.0–17.0)
MCHC: 37.8 g/dL — ABNORMAL HIGH (ref 30.0–36.0)
RDW: 20.5 % — ABNORMAL HIGH (ref 11.5–15.5)

## 2012-12-11 LAB — RAPID URINE DRUG SCREEN, HOSP PERFORMED
Barbiturates: NOT DETECTED
Tetrahydrocannabinol: POSITIVE — AB

## 2012-12-11 LAB — LACTIC ACID, PLASMA: Lactic Acid, Venous: 0.5 mmol/L (ref 0.5–2.2)

## 2012-12-11 MED ORDER — HYDROMORPHONE HCL PF 1 MG/ML IJ SOLN
1.0000 mg | Freq: Once | INTRAMUSCULAR | Status: AC
  Administered 2012-12-11: 1 mg via INTRAVENOUS

## 2012-12-11 MED ORDER — HYDROXYUREA 500 MG PO CAPS
500.0000 mg | ORAL_CAPSULE | Freq: Three times a day (TID) | ORAL | Status: DC
  Administered 2012-12-11 – 2012-12-17 (×20): 500 mg via ORAL
  Filled 2012-12-11 (×23): qty 1

## 2012-12-11 MED ORDER — SODIUM CHLORIDE 0.45 % IV SOLN
INTRAVENOUS | Status: DC
  Administered 2012-12-11 – 2012-12-17 (×8): via INTRAVENOUS

## 2012-12-11 MED ORDER — HYDROMORPHONE HCL 2 MG PO TABS
8.0000 mg | ORAL_TABLET | Freq: Four times a day (QID) | ORAL | Status: DC | PRN
  Administered 2012-12-11 – 2012-12-16 (×12): 8 mg via ORAL
  Filled 2012-12-11 (×3): qty 4
  Filled 2012-12-11: qty 3
  Filled 2012-12-11 (×6): qty 4
  Filled 2012-12-11: qty 1
  Filled 2012-12-11 (×3): qty 4

## 2012-12-11 MED ORDER — WARFARIN VIDEO
Freq: Once | Status: AC
  Administered 2012-12-12: 07:00:00

## 2012-12-11 MED ORDER — PROMETHAZINE HCL 25 MG PO TABS
12.5000 mg | ORAL_TABLET | Freq: Four times a day (QID) | ORAL | Status: DC | PRN
  Administered 2012-12-11 – 2012-12-12 (×4): 12.5 mg via ORAL
  Filled 2012-12-11 (×5): qty 1

## 2012-12-11 MED ORDER — HYDROMORPHONE HCL ER 8 MG PO T24A
16.0000 mg | EXTENDED_RELEASE_TABLET | ORAL | Status: DC
  Administered 2012-12-11 – 2012-12-17 (×7): 16 mg via ORAL
  Filled 2012-12-11 (×2): qty 1
  Filled 2012-12-11: qty 2
  Filled 2012-12-11: qty 1
  Filled 2012-12-11: qty 2
  Filled 2012-12-11: qty 1
  Filled 2012-12-11 (×3): qty 2

## 2012-12-11 MED ORDER — HYDROMORPHONE HCL PF 2 MG/ML IJ SOLN
2.0000 mg | INTRAMUSCULAR | Status: AC
  Administered 2012-12-11 – 2012-12-12 (×8): 2 mg via INTRAVENOUS
  Filled 2012-12-11 (×8): qty 1

## 2012-12-11 MED ORDER — ONDANSETRON HCL 4 MG/2ML IJ SOLN
4.0000 mg | Freq: Four times a day (QID) | INTRAMUSCULAR | Status: DC | PRN
  Administered 2012-12-11 – 2012-12-17 (×20): 4 mg via INTRAVENOUS
  Filled 2012-12-11 (×20): qty 2

## 2012-12-11 MED ORDER — LINEZOLID 600 MG PO TABS
600.0000 mg | ORAL_TABLET | Freq: Two times a day (BID) | ORAL | Status: DC
  Administered 2012-12-11 – 2012-12-15 (×9): 600 mg via ORAL
  Filled 2012-12-11 (×10): qty 1

## 2012-12-11 MED ORDER — DEFERASIROX 500 MG PO TBSO: 2000.0000 mg | ORAL_TABLET | Freq: Every day | ORAL | Status: DC

## 2012-12-11 MED ORDER — PIPERACILLIN-TAZOBACTAM 3.375 G IVPB
3.3750 g | Freq: Three times a day (TID) | INTRAVENOUS | Status: DC
  Administered 2012-12-11 – 2012-12-12 (×4): 3.375 g via INTRAVENOUS
  Filled 2012-12-11 (×5): qty 50

## 2012-12-11 MED ORDER — WARFARIN SODIUM 5 MG PO TABS
5.0000 mg | ORAL_TABLET | Freq: Once | ORAL | Status: AC
  Administered 2012-12-11: 5 mg via ORAL
  Filled 2012-12-11: qty 1

## 2012-12-11 MED ORDER — PROMETHAZINE HCL 6.25 MG/5ML PO SYRP: 12.5000 mg | ORAL_SOLUTION | Freq: Four times a day (QID) | ORAL | Status: DC | PRN

## 2012-12-11 MED ORDER — FOLIC ACID 1 MG PO TABS
1.0000 mg | ORAL_TABLET | Freq: Every day | ORAL | Status: DC
  Administered 2012-12-11 – 2012-12-17 (×7): 1 mg via ORAL
  Filled 2012-12-11 (×7): qty 1

## 2012-12-11 MED ORDER — HYDROMORPHONE HCL PF 1 MG/ML IJ SOLN
1.0000 mg | INTRAMUSCULAR | Status: DC | PRN
  Administered 2012-12-11 (×2): 1 mg via INTRAVENOUS
  Filled 2012-12-11 (×3): qty 1

## 2012-12-11 MED ORDER — SODIUM CHLORIDE 0.9 % IJ SOLN
3.0000 mL | Freq: Two times a day (BID) | INTRAMUSCULAR | Status: DC
  Administered 2012-12-12: 3 mL via INTRAVENOUS

## 2012-12-11 MED ORDER — HYDROMORPHONE HCL PF 2 MG/ML IJ SOLN: 2.0000 mg | INTRAMUSCULAR | Status: DC | PRN

## 2012-12-11 MED ORDER — DEXTROSE 5 % IV SOLN
2000.0000 mg | Freq: Every day | INTRAVENOUS | Status: DC
  Administered 2012-12-11 – 2012-12-16 (×6): 2000 mg via INTRAVENOUS
  Filled 2012-12-11 (×7): qty 2

## 2012-12-11 MED ORDER — ENOXAPARIN SODIUM 60 MG/0.6ML ~~LOC~~ SOLN
60.0000 mg | Freq: Two times a day (BID) | SUBCUTANEOUS | Status: DC
  Administered 2012-12-12 – 2012-12-17 (×11): 60 mg via SUBCUTANEOUS
  Filled 2012-12-11 (×13): qty 0.6

## 2012-12-11 MED ORDER — DIPHENHYDRAMINE HCL 25 MG PO CAPS
25.0000 mg | ORAL_CAPSULE | Freq: Four times a day (QID) | ORAL | Status: DC | PRN
  Administered 2012-12-11 – 2012-12-14 (×11): 25 mg via ORAL
  Filled 2012-12-11 (×11): qty 1

## 2012-12-11 MED ORDER — ENOXAPARIN SODIUM 60 MG/0.6ML ~~LOC~~ SOLN
60.0000 mg | Freq: Once | SUBCUTANEOUS | Status: AC
  Administered 2012-12-11: 60 mg via SUBCUTANEOUS
  Filled 2012-12-11: qty 0.6

## 2012-12-11 MED ORDER — HEPARIN (PORCINE) IN NACL 100-0.45 UNIT/ML-% IJ SOLN
1300.0000 [IU]/h | INTRAMUSCULAR | Status: DC
  Filled 2012-12-11: qty 250

## 2012-12-11 MED ORDER — WARFARIN - PHARMACIST DOSING INPATIENT: Freq: Every day | Status: DC

## 2012-12-11 MED ORDER — TEMAZEPAM 15 MG PO CAPS: 15.0000 mg | ORAL_CAPSULE | Freq: Every evening | ORAL | Status: DC | PRN

## 2012-12-11 MED ORDER — ONDANSETRON HCL 4 MG PO TABS
4.0000 mg | ORAL_TABLET | Freq: Four times a day (QID) | ORAL | Status: DC | PRN
  Filled 2012-12-11: qty 1

## 2012-12-11 MED ORDER — ALBUTEROL SULFATE HFA 108 (90 BASE) MCG/ACT IN AERS
2.0000 | INHALATION_SPRAY | Freq: Four times a day (QID) | RESPIRATORY_TRACT | Status: DC | PRN
  Filled 2012-12-11: qty 6.7

## 2012-12-11 MED ORDER — WARFARIN SODIUM 2.5 MG PO TABS
2.5000 mg | ORAL_TABLET | ORAL | Status: AC
  Administered 2012-12-11: 2.5 mg via ORAL
  Filled 2012-12-11: qty 1

## 2012-12-11 MED ORDER — PATIENT'S GUIDE TO USING COUMADIN BOOK
Freq: Once | Status: AC
  Administered 2012-12-12: 07:00:00
  Filled 2012-12-11: qty 1

## 2012-12-11 NOTE — Consult Note (Signed)
[facility]    Date of Admission:  12/10/2012  Date of Consult:  12/11/2012  Reason for Consult: R IJ thrombus with possible septic thrombophlebitis Referring Physician: Dr. Waymon Amato   HPI: Christopher Warren is an 37 y.o. male with Sickle cell disease, splenectomy, who was admitted with onset 3 days pta of neck pain and back, and subjective fevers, chills. ON admission his labs were pertinent for WBC of 36K and thrombocytosis. CT of the neck showed an occlusive thrombus in R IJ associated with pts chronic porta-cath and with surrounding inflammatory changes. He has had blood cultures drawn and has been placed on IV heparin, as well IV vancomycin and zosyn. We were consulted to assist in workup for possible septic thrombophlebitis.    Past Medical History  Diagnosis Date  . Sickle cell anemia   . Asthma   . 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 Heart murmur     Past Surgical History  Procedure Laterality Date  . Cholecystectomy    . 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 Morphine And Related Hives    Patient states can take Dilaudid with benadryl  . Adhesive (Tape)   . Latex      Medications: I have reviewed patients current medications as documented in Epic Anti-infectives   Start     Dose/Rate Route Frequency Ordered Stop   12/11/12 1300  linezolid (ZYVOX) tablet 600 mg     600 mg Oral Every 12 hours 12/11/12 1135     12/11/12 0100  piperacillin-tazobactam (ZOSYN) IVPB 3.375 g     3.375 g 12.5 mL/hr over 240 Minutes Intravenous 3 times per day 12/11/12 0050     12/11/12 0000  vancomycin (VANCOCIN) IVPB 750 mg/150 ml premix  Status:  Discontinued     750 mg 150 mL/hr over 60 Minutes Intravenous Every 8 hours 12/10/12 2351 12/11/12 1135      Social History:  reports that he has been  smoking Cigarettes.  He has a 5 pack-year smoking history. He has never used smokeless tobacco. He reports that he does not drink alcohol or use illicit drugs.  Family History  Problem Relation Age of Onset  . Sickle cell anemia Mother   . Sickle cell anemia Son     As in HPI and primary teams notes otherwise 12 point review of systems is negative  Blood pressure 111/53, pulse 81, temperature 98.6 F (37 C), temperature source Oral, resp. rate 20, height 5\' 5"  (1.651 m), weight 131 lb 1.6 oz (59.467 kg), SpO2 94.00%. General: Alert and awake, oriented x3, anxious HEENT:  pupils reactive to light and accommodation, EOMI, oropharynx clear and without exudate, neck is swollen and tender to palpation CVS regular rate, normal r, II/VI systolic murmur Chest: diminished bs at left base but otherwise clear Abdomen: soft nondistended, normal bowel sounds, Extremities: no  clubbing or edema noted bilaterally Skin: portacath not overltly swollen or hot Neuro: nonfocal, strength and sensation intact   Results for orders placed during the hospital encounter of 12/10/12 (from the past 48 hour(s))  CBC WITH DIFFERENTIAL     Status: Abnormal   Collection Time    12/10/12  7:14 PM      Result Value Range   WBC 36.0 (*) 4.0 - 10.5 K/uL   RBC 2.19 (*)  4.22 - 5.81 MIL/uL   Hemoglobin 7.5 (*) 13.0 - 17.0 g/dL   HCT 82.9 (*) 56.2 - 13.0 %   MCV 90.4  78.0 - 100.0 fL   MCH 34.2 (*) 26.0 - 34.0 pg   MCHC 37.9 (*) 30.0 - 36.0 g/dL   Comment: SICKLE CELLS   RDW 20.6 (*) 11.5 - 15.5 %   Platelets 477 (*) 150 - 400 K/uL   Neutrophils Relative % 80 (*) 43 - 77 %   Lymphocytes Relative 11 (*) 12 - 46 %   Monocytes Relative 9  3 - 12 %   Eosinophils Relative 0  0 - 5 %   Basophils Relative 0  0 - 1 %   Neutro Abs 28.8 (*) 1.7 - 7.7 K/uL   Lymphs Abs 4.0  0.7 - 4.0 K/uL   Monocytes Absolute 3.2 (*) 0.1 - 1.0 K/uL   Eosinophils Absolute 0.0  0.0 - 0.7 K/uL   Basophils Absolute 0.0  0.0 - 0.1 K/uL   RBC  Morphology POLYCHROMASIA PRESENT     Comment: TARGET CELLS     SICKLE CELLS  BASIC METABOLIC PANEL     Status: Abnormal   Collection Time    12/10/12  7:14 PM      Result Value Range   Sodium 138  135 - 145 mEq/L   Potassium 3.6  3.5 - 5.1 mEq/L   Chloride 102  96 - 112 mEq/L   CO2 27  19 - 32 mEq/L   Glucose, Bld 112 (*) 70 - 99 mg/dL   BUN 8  6 - 23 mg/dL   Creatinine, Ser 8.65  0.50 - 1.35 mg/dL   Calcium 8.7  8.4 - 78.4 mg/dL   GFR calc non Af Amer >90  >90 mL/min   GFR calc Af Amer >90  >90 mL/min   Comment:            The eGFR has been calculated     using the CKD EPI equation.     This calculation has not been     validated in all clinical     situations.     eGFR's persistently     <90 mL/min signify     possible Chronic Kidney Disease.  RETICULOCYTES     Status: Abnormal   Collection Time    12/10/12  7:14 PM      Result Value Range   Retic Ct Pct 18.8 (*) 0.4 - 3.1 %   RBC. 2.19 (*) 4.22 - 5.81 MIL/uL   Retic Count, Manual 411.7 (*) 19.0 - 186.0 K/uL  RAPID STREP SCREEN     Status: None   Collection Time    12/10/12  8:36 PM      Result Value Range   Streptococcus, Group A Screen (Direct) NEGATIVE  NEGATIVE   Comment: (NOTE)     A Rapid Antigen test may result negative if the antigen level in the     sample is below the detection level of this test. The FDA has not     cleared this test as a stand-alone test therefore the rapid antigen     negative result has reflexed to a Group A Strep culture.  PROTIME-INR     Status: None   Collection Time    12/10/12 10:10 PM      Result Value Range   Prothrombin Time 15.1  11.6 - 15.2 seconds   INR 1.22  0.00 - 1.49  APTT  Status: Abnormal   Collection Time    12/10/12 10:10 PM      Result Value Range   aPTT 44 (*) 24 - 37 seconds   Comment:            IF BASELINE aPTT IS ELEVATED,     SUGGEST PATIENT RISK ASSESSMENT     BE USED TO DETERMINE APPROPRIATE     ANTICOAGULANT THERAPY.  PROCALCITONIN     Status:  None   Collection Time    12/11/12 12:22 AM      Result Value Range   Procalcitonin 0.11     Comment:            Interpretation:     PCT (Procalcitonin) <= 0.5 ng/mL:     Systemic infection (sepsis) is not likely.     Local bacterial infection is possible.     (NOTE)             ICU PCT Algorithm               Non ICU PCT Algorithm        ----------------------------     ------------------------------             PCT < 0.25 ng/mL                 PCT < 0.1 ng/mL         Stopping of antibiotics            Stopping of antibiotics           strongly encouraged.               strongly encouraged.        ----------------------------     ------------------------------           PCT level decrease by               PCT < 0.25 ng/mL           >= 80% from peak PCT           OR PCT 0.25 - 0.5 ng/mL          Stopping of antibiotics                                                 encouraged.         Stopping of antibiotics               encouraged.        ----------------------------     ------------------------------           PCT level decrease by              PCT >= 0.25 ng/mL           < 80% from peak PCT            AND PCT >= 0.5 ng/mL            Continuing antibiotics                                                  encouraged.           Continuing antibiotics  encouraged.        ----------------------------     ------------------------------         PCT level increase compared          PCT > 0.5 ng/mL             with peak PCT AND              PCT >= 0.5 ng/mL             Escalation of antibiotics                                              strongly encouraged.          Escalation of antibiotics            strongly encouraged.  LACTIC ACID, PLASMA     Status: None   Collection Time    12/11/12 12:22 AM      Result Value Range   Lactic Acid, Venous 0.5  0.5 - 2.2 mmol/L  HEPARIN LEVEL (UNFRACTIONATED)     Status: Abnormal   Collection Time    12/11/12  7:15 AM       Result Value Range   Heparin Unfractionated 0.28 (*) 0.30 - 0.70 IU/mL   Comment:            IF HEPARIN RESULTS ARE BELOW     EXPECTED VALUES, AND PATIENT     DOSAGE HAS BEEN CONFIRMED,     SUGGEST FOLLOW UP TESTING     OF ANTITHROMBIN III LEVELS.  CBC     Status: Abnormal   Collection Time    12/11/12  7:15 AM      Result Value Range   WBC 26.3 (*) 4.0 - 10.5 K/uL   RBC 2.08 (*) 4.22 - 5.81 MIL/uL   Hemoglobin 7.1 (*) 13.0 - 17.0 g/dL   HCT 16.1 (*) 09.6 - 04.5 %   MCV 90.4  78.0 - 100.0 fL   MCH 34.1 (*) 26.0 - 34.0 pg   MCHC 37.8 (*) 30.0 - 36.0 g/dL   Comment: SICKLE CELLS   RDW 20.5 (*) 11.5 - 15.5 %   Platelets 431 (*) 150 - 400 K/uL  PROTIME-INR     Status: None   Collection Time    12/11/12  7:15 AM      Result Value Range   Prothrombin Time 15.0  11.6 - 15.2 seconds   INR 1.21  0.00 - 1.49  BASIC METABOLIC PANEL     Status: None   Collection Time    12/11/12  7:15 AM      Result Value Range   Sodium 137  135 - 145 mEq/L   Potassium 3.6  3.5 - 5.1 mEq/L   Chloride 102  96 - 112 mEq/L   CO2 27  19 - 32 mEq/L   Glucose, Bld 92  70 - 99 mg/dL   BUN 8  6 - 23 mg/dL   Creatinine, Ser 4.09  0.50 - 1.35 mg/dL   Calcium 8.8  8.4 - 81.1 mg/dL   GFR calc non Af Amer >90  >90 mL/min   GFR calc Af Amer >90  >90 mL/min   Comment:            The eGFR has been calculated     using the CKD EPI equation.  This calculation has not been     validated in all clinical     situations.     eGFR's persistently     <90 mL/min signify     possible Chronic Kidney Disease.  LACTATE DEHYDROGENASE     Status: Abnormal   Collection Time    12/11/12  7:15 AM      Result Value Range   LDH 470 (*) 94 - 250 U/L   Comment: NO VISIBLE HEMOLYSIS      Component Value Date/Time   SDES BLOOD CENTRAL LINE 11/12/2011 0120   SPECREQUEST BOTTLES DRAWN AEROBIC ONLY 9cc 11/12/2011 0120   CULT NO GROWTH 5 DAYS 11/12/2011 0120   REPTSTATUS 11/18/2011 FINAL 11/12/2011 0120   X-ray Chest Pa And  Lateral   12/11/2012   *RADIOLOGY REPORT*  Clinical Data: Right-sided port catheter is painful with pain and swelling involving the right side of the neck  CHEST - 2 VIEW  Comparison: 10/05/2012; 04/24/2012; 10/03/2011  Findings:  Grossly unchanged borderline enlarged cardiac silhouette.  Normal mediastinal contours.  Stable positioning of support apparatus.  No pneumothorax.  Overall improved aeration of the lungs with persistent mild diffuse slightly nodular thickening of the pulmonary interstitium with pleural parenchymal thickening along the right minor fissure. No focal airspace opacities.  There is chronic blunting of the right costophrenic angle without definite pleural effusion.  No definite evidence of edema.  No pneumothorax or evidence of edema.  Unchanged bones.  Post cholecystectomy.  IMPRESSION: 1.  Overall improved aeration of the lungs with persistent interstitial thickening / bronchitic change. 2. Stable positioning of support apparatus.   Original Report Authenticated By: Tacey Ruiz, MD   Ct Soft Tissue Neck W Contrast  12/10/2012   *RADIOLOGY REPORT*  Clinical Data: The neck swelling, pain  CT NECK WITH CONTRAST  Technique:  Multidetector CT imaging of the neck was performed with intravenous contrast.  Contrast: 80mL OMNIPAQUE IOHEXOL 300 MG/ML  SOLN  Comparison: Prior radiograph from 10/05/2012.  Findings: The visualized portions of the intracranial brain are unremarkable.  The orbits are unremarkable.  The paranasal sinuses are clear.  The oral cavity, oropharynx, and nasopharynx is unremarkable. [place] and hypopharynx is unremarkable.  The thyroid is normal.  No pathologically enlarged lymph nodes are identified within the neck.  A filling defect is seen within the mildly expanded right internal jugular vein, proximal to the insertion of the right-sided Port-A- Cath (series 6, image 56). Distally, the right internal jugular vein appears completely occluded.  There is associated  inflammatory stranding within the soft tissues around the occluded right internal jugular vein in the lower right neck, deep to the right sternocleidomastoid muscle.  The right common, internal, and external carotid arteries are normal. The left internal jugular vein is patent.  The partially visualized lungs are clear.  No osseous abnormalities are identified.  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   Original Report Authenticated By: Rise Mu, M.D.     Recent Results (from the past 720 hour(s))  RAPID STREP SCREEN     Status: None   Collection Time    12/10/12  8:36 PM      Result Value Range Status   Streptococcus, Group A Screen (Direct) NEGATIVE  NEGATIVE Final   Comment: (NOTE)     A Rapid Antigen test may result negative if the antigen level in the  sample is below the detection level of this test. The FDA has not     cleared this test as a stand-alone test therefore the rapid antigen     negative result has reflexed to a Group A Strep culture.     Impression/Recommendation  37 year old with sickle cell disease, chronic porta-cath admitted with neck pain subjective chills found to have occlusive thrombus associated with portacath currently on heparin, zosyn and vancomycin (though vanco and heparin not compatible via same line)  #1 IJ thrombus +/-septic thrombophlebitis: NOT CLEARLY a case of septic thrombophlebitis but reasonable to cover for this pending cultures and further investigation   --continue zosyn to cover GNR and anerobes and for good bactericidal activity vs strep, MSSA --change vanco to linezolid to avoid problems with co-admin of heparin IV  --Certainly IF this is case of septic thrombophlebitis then porta-cath MUST come out (and would consult [facility] re this) and additionally he would need thrombectomy by  [facility]  --Given that the clot formed around a porta-cath I would think that portacth needs to come out regardless to remove nidus for clot, and if there is still equivocation for infected clot examinationn and culture of area and porta cath itself could be informative  --I WOULD NOT place long term PICC until we have proven his blood cultures to be sterile and would try to place peripheral IV when porta cath comes out or if not  Then a short term picc, midline  #2 Screening: check HIV   Thank you so much for this interesting consult  [facility] [phone] (pager) [phone] (office) 12/11/2012, 12:30 PM  Paulette Blanch Dam 12/11/2012, 12:30 PM

## 2012-12-11 NOTE — Progress Notes (Signed)
Unable to give patients Vancomycin. Patient has Heparin infusing in port-a-cath which is incompatible with Vancomycin. Attempted to obtain peripheral access. This RN tried twice and IV team tried 4 times to obtain IV. NP on-call notified.

## 2012-12-11 NOTE — Progress Notes (Signed)
ANTICOAGULATION CONSULT NOTE - Initial Consult  Pharmacy Consult for Heparin/warfarin Indication: DVT  Allergies  Allergen Reactions  . Morphine And Related Hives    Patient states can take Dilaudid with benadryl  . Adhesive (Tape)   . Latex     Patient Measurements: Height: 5\' 5"  (165.1 cm) Weight: 131 lb 1.6 oz (59.467 kg) IBW/kg (Calculated) : 61.5 Heparin Dosing Weight:   Vital Signs: Temp: 99 F (37.2 C) (07/01 0203) Temp src: Oral (07/01 0203) BP: 111/51 mmHg (07/01 0203) Pulse Rate: 73 (07/01 0203)  Labs:  Recent Labs  12/10/12 1914 12/10/12 2210  HGB 7.5*  --   HCT 19.8*  --   PLT 477*  --   APTT  --  44*  LABPROT  --  15.1  INR  --  1.22  CREATININE 0.73  --     Estimated Creatinine Clearance: 109.5 ml/min (by C-G formula based on Cr of 0.73).   Medical History: Past Medical History  Diagnosis Date  . Sickle cell anemia   . Asthma   . 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 Heart murmur     Medications:  Scheduled:  . deferasirox  2,000 mg Oral Daily  . folic acid  1 mg Oral Daily  . HYDROmorphone HCl  16 mg Oral Q24H  . hydroxyurea  500 mg Oral TID  . piperacillin-tazobactam (ZOSYN)  IV  3.375 g Intravenous Q8H  . sodium chloride  3 mL Intravenous Q12H  . vancomycin  750 mg Intravenous Q8H  . Warfarin - Pharmacist Dosing Inpatient   Does not apply q1800   Infusions:  . sodium chloride 75 mL/hr at 12/11/12 0215  . heparin 1,200 Units/hr (12/11/12 0024)    Assessment: Patient with chronic warfarin for hx of DVT.  INR <2 on admit.    Goal of Therapy:  INR 2-3 Heparin level 0.3-0.7 units/ml Monitor platelets by anticoagulation protocol: Yes   Plan:  Heparin bolus 2500 units iv x1 Heparin drip at 1200 units/hr Daily heparin level and CBC Next heparin level at  0800 Warfarin 2.5mg  po x1 (done) Daily INR    Aleene Davidson Crowford 12/11/2012,3:12 AM

## 2012-12-11 NOTE — ED Notes (Signed)
Pt ambulated to the BR with steady gait.   

## 2012-12-11 NOTE — Progress Notes (Addendum)
ANTICOAGULATION CONSULT NOTE - Follow-up Consult  Pharmacy Consult for Heparin/warfarin  (heparin to lovenox) Indication: DVT in R IJ vein  Allergies  Allergen Reactions  . Morphine And Related Hives    Patient states can take Dilaudid with benadryl  . Adhesive (Tape)   . Latex     Patient Measurements: Height: 5\' 5"  (165.1 cm) Weight: 131 lb 1.6 oz (59.467 kg) IBW/kg (Calculated) : 61.5 Heparin Dosing Weight:   Vital Signs: Temp: 98.6 F (37 C) (07/01 0605) Temp src: Oral (07/01 0605) BP: 111/53 mmHg (07/01 0605) Pulse Rate: 81 (07/01 0605)  Labs:  Recent Labs  12/10/12 1914 12/10/12 2210 12/11/12 0715  HGB 7.5*  --  7.1*  HCT 19.8*  --  18.8*  PLT 477*  --  431*  APTT  --  44*  --   LABPROT  --  15.1 15.0  INR  --  1.22 1.21  HEPARINUNFRC  --   --  0.28*  CREATININE 0.73  --  0.61    Estimated Creatinine Clearance: 109.5 ml/min (by C-G formula based on Cr of 0.61).   Medical History: Past Medical History  Diagnosis Date  . Sickle cell anemia   . Asthma   . 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 Heart murmur     Medications:  Scheduled:  . deferoxamine (DESFERAL) IV  2,000 mg Intravenous QHS  . folic acid  1 mg Oral Daily  . HYDROmorphone HCl  16 mg Oral Q24H  . hydroxyurea  500 mg Oral TID  . piperacillin-tazobactam (ZOSYN)  IV  3.375 g Intravenous Q8H  . sodium chloride  3 mL Intravenous Q12H  . vancomycin  750 mg Intravenous Q8H  . Warfarin - Pharmacist Dosing Inpatient   Does not apply q1800   Infusions:  . sodium chloride 75 mL/hr at 12/11/12 0215  . heparin 1,200 Units/hr (12/11/12 0024)    Assessment: 36 YOM with sickle-cell disease, presents with acute pain crisis, including pain and swelling to R side of neck. CT revealed occlusive thrombus of R IJ proximal to port-a-cath.  He is on (low-dose) warfarin prior to admission for port-a-cath "maintenance."  He has been started on heparin gtt and resumed warfarin.  Home warfarin dose was 2mg   (again for catheter maintenance, not for TREATMENT)  Initial heparin level = 0.28  CBC: hgb = 7.1 (acute sickle-cell crisis), plts WNL  Goal of Therapy:  INR 2-3 Heparin level 0.3-0.7 units/ml Monitor platelets by anticoagulation protocol: Yes   Plan:   D#1 of minimum 5 day overlap for acute VTE  Coumadin 5mg  tonight (2.5mg  given early this am)  Increase heparin gtt to 1300 units/hr  Check 6h heparin level  Daily CBC, heparin level, and INR  Juliette Alcide, PharmD, BCPS.   Pager: [phone]  12/11/2012,8:16 AM  ADDENDUM: Spoke with Dr. Waymon Amato, port-a-cath is likely staying (unless blood cultures dictate otherwise, but will not be removed for thrombus at this time).  Thus will switch heparin gtt to lovenox   Plan:  Turn-off heparin gtt  In 1hr given lovenox 60mg  (1mg /kg) SQ x1 then then q12h  Monitor CBC at minimum of q72h  Notify ID that heparin gtt stopped as zyvox started in place of vancomycin d/t compatibility issues  Juliette Alcide, PharmD, BCPS.   Pager: [phone] 12/11/2012 1:50 PM

## 2012-12-11 NOTE — Care Management Note (Signed)
    Page 1 of 1   12/11/2012     4:05:50 PM   CARE MANAGEMENT NOTE 12/11/2012  Patient:  Christopher Warren, Christopher Warren   Account Number:  [id number]  Date Initiated:  12/11/2012  Documentation initiated by:  Lanier Clam  Subjective/Objective Assessment:   ADMITTED W/R IJ THROMBUS.SICKLE CELL CRISIS.     Action/Plan:   FROM HOME.HAS PCP, PHARMACY.   Anticipated DC Date:  12/14/2012   Anticipated DC Plan:  HOME/SELF CARE      DC Planning Services  CM consult      Choice offered to / List presented to:             Status of service:  In process, will continue to follow Medicare Important Message given?   (If response is "NO", the following Medicare IM given date fields will be blank) Date Medicare IM given:   Date Additional Medicare IM given:    Discharge Disposition:    Per UR Regulation:  Reviewed for med. necessity/level of care/duration of stay  If discussed at Long Length of Stay Meetings, dates discussed:    Comments:  12/11/12 [facility] RN,BSN NCM [phone]

## 2012-12-11 NOTE — ED Notes (Signed)
Pt sitting on the side of the stretcher.  Pt states he needs to let his "people" know what's going on, states his cellphone is in his car, therefore, he needs to go outside to his car.  Pt made aware that he cannot go outside while he is a pt in the ED.  Pt falling asleep while this nurse is talking to pt.  Pt advised to lay down before he falls.

## 2012-12-11 NOTE — Progress Notes (Addendum)
[facility] PROGRESS NOTE  Christopher Warren ZOX:[id number] DOB: [birth year] DOA: 12/10/2012 PCP: No primary provider on file.  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 In the ED, temperature 100.67F WBC 36K, hemoglobin 7.5, platelets 477, reticulocytes 412 and CT of the neck showed occlusive thrombus within the right internal jugular vein with associated inflammatory stranding in the lower right neck proximal to the insertion of the right sided Port-A-Cath. Hospitalist admission was requested. [facility] apparently [facility]ologists due to hostile behavior. He currently does not have a PCP or hematologist.  Assessment/Plan: 1. Right internal jugular DVT +/- septic thrombophlebitis: Patient will be transitioned from IV heparin infusion to subcutaneous Lovenox bridging. Continue Coumadin per pharmacy (he had been on low dose Coumadin PTA). Infectious disease consulted and recommend continued Zosyn and vancomycin will be changed to linezolid. Followup blood cultures drawn on admission. His blood cultures positive, his Port-A-Cath needs to come out. If blood cultures negative-? Continued anticoagulation and reimaging/ultrasound for recanalization versus Port-A-Cath removal. 2. Hemoglobin SS disease with vaso-occlusive crisis: The patient's major pain seems to be on the right side of the neck. Continue hypotonic IV fluids and pain management. Follow CBCs daily and transfuse when necessary. LDH 470. Check bilirubin. 3. Sickle cell anemia: Baseline hemoglobin? 7.5-8. Transfuse if hemoglobin is less than 7 g per DL. 4. Leukocytosis: Appears chronic? Baseline 15-20. Patient already on antibiotics. 5. Status post  splenectomy 6. Iron overload: Continue deferoxamine. Check ferritin   Code Status: Full Family Communication: Discussed with brother at bedside. Disposition Plan: Home when medically stable   Consultants:  Infectious disease   Procedures:  None  Antibiotics:  IV Zosyn 7/1 >  Linezolid 7/1 >   IV vancomycin-DC'd   HPI/Subjective: Complains of moderate right-sided neck pain that is worse with neck movement. Mild intermittent low back pain and pain in the legs. No chest pain or dyspnea.  Objective: Filed Vitals:   12/11/12 0116 12/11/12 0203 12/11/12 0605 12/11/12 0609  BP: 114/42 111/51 111/53   Pulse: 84 73 81   Temp: 98.9 F (37.2 C) 99 F (37.2 C) 98.6 F (37 C)   TempSrc: Oral Oral Oral   Resp: 15 16 20    Height:  5\' 5"  (1.651 m)    Weight:  59.467 kg (131 lb 1.6 oz)    SpO2: 94% 95% 89% 94%    Intake/Output Summary (Last 24 hours) at 12/11/12 1402 Last data filed at 12/11/12 0500  Gross per 24 hour  Intake 256.25 ml  Output      0 ml  Net 256.25 ml   Filed Weights   12/10/12 2239 12/11/12 0203  Weight: 58.06 kg (128 lb) 59.467 kg (131 lb 1.6 oz)    Exam:   General exam: Appears comfortable.  Neck: Mild swelling, warmth and tenderness right side of neck. No redness.  Respiratory system: Clear. No increased work of breathing. Right sided Port-A-Cath site looks clean, dry and intact.  Cardiovascular system: S1 & S2 heard, RRR. No JVD, gallops, clicks or pedal edema. 2+ systolic murmur at apex. Telemetry: Sinus rhythm.  Gastrointestinal system: Abdomen is nondistended, soft and nontender. Normal bowel sounds heard.  Central nervous system: Alert and oriented. No focal neurological deficits.  Extremities: Symmetric 5 x 5 power.  Data Reviewed: Basic Metabolic Panel:  Recent Labs Lab 12/10/12 1914 12/11/12 0715  NA 138 137  K 3.6 3.6  CL 102 102  CO2 27 27  GLUCOSE 112* 92  BUN 8 8  CREATININE 0.73 0.61  CALCIUM 8.7 8.8   Liver  Function Tests: No results found for this basename: AST, ALT, ALKPHOS, BILITOT, PROT, ALBUMIN,  in the last 168 hours No results found for this basename: LIPASE, AMYLASE,  in the last 168 hours No results found for this basename: AMMONIA,  in the last 168 hours CBC:  Recent Labs Lab 12/10/12 1914 12/11/12 0715  WBC 36.0* 26.3*  NEUTROABS 28.8*  --   HGB 7.5* 7.1*  HCT 19.8* 18.8*  MCV 90.4 90.4  PLT 477* 431*   Cardiac Enzymes: No results found for this basename: CKTOTAL, CKMB, CKMBINDEX, TROPONINI,  in the last 168 hours BNP (last 3 results) No results found for this basename: PROBNP,  in the last 8760 hours CBG: No results found for this basename: GLUCAP,  in the last 168 hours  Recent Results (from the past 240 hour(s))  RAPID STREP SCREEN     Status: None   Collection Time    12/10/12  8:36 PM      Result Value Range Status   Streptococcus, Group A Screen (Direct) NEGATIVE  NEGATIVE Final   Comment: (NOTE)     A Rapid Antigen test may result negative if the antigen level in the     sample is below the detection level of this test. The FDA has not     cleared this test as a stand-alone test therefore the rapid antigen     negative result has reflexed to a Group A Strep culture.     Studies: X-ray Chest Pa And Lateral   12/11/2012   *RADIOLOGY REPORT*  Clinical Data: Right-sided port catheter is painful with pain and swelling involving the right side of the neck  CHEST - 2 VIEW  Comparison: 10/05/2012; 04/24/2012; 10/03/2011  Findings:  Grossly unchanged borderline enlarged cardiac silhouette.  Normal mediastinal contours.  Stable positioning of support apparatus.  No pneumothorax.  Overall improved aeration of the lungs with persistent mild diffuse slightly nodular thickening of the pulmonary interstitium with pleural parenchymal thickening along the right minor fissure. No focal airspace opacities.  There is chronic blunting of the right costophrenic angle without definite  pleural effusion.  No definite evidence of edema.  No pneumothorax or evidence of edema.  Unchanged bones.  Post cholecystectomy.  IMPRESSION: 1.  Overall improved aeration of the lungs with persistent interstitial thickening / bronchitic change. 2. Stable positioning of support apparatus.   Original Report Authenticated By: Tacey Ruiz, MD   Ct Soft Tissue Neck W Contrast  12/10/2012   *RADIOLOGY REPORT*  Clinical Data: The neck swelling, pain  CT NECK WITH CONTRAST  Technique:  Multidetector CT imaging of the neck was performed with intravenous contrast.  Contrast: 80mL OMNIPAQUE IOHEXOL 300 MG/ML  SOLN  Comparison: Prior radiograph from 10/05/2012.  Findings: The visualized portions of the intracranial brain are unremarkable.  The orbits are unremarkable.  The paranasal sinuses are clear.  The oral cavity, oropharynx, and nasopharynx is unremarkable. Heights and hypopharynx is unremarkable.  The thyroid is normal.  No pathologically enlarged lymph nodes are identified within the neck.  A filling defect is seen within the mildly expanded right internal jugular vein, proximal to the insertion of the right-sided Port-A- Cath (series 6, image 56). Distally, the  right internal jugular vein appears completely occluded.  There is associated inflammatory stranding within the soft tissues around the occluded right internal jugular vein in the lower right neck, deep to the right sternocleidomastoid muscle.  The right common, internal, and external carotid arteries are normal. The left internal jugular vein is patent.  The partially visualized lungs are clear.  No osseous abnormalities are identified.  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   Original Report Authenticated By: Rise Mu, M.D.     Additional labs:   Scheduled Meds: . deferoxamine  (DESFERAL) IV  2,000 mg Intravenous QHS  . enoxaparin (LOVENOX) injection  60 mg Subcutaneous Once  . [START ON 12/12/2012] enoxaparin (LOVENOX) injection  60 mg Subcutaneous Q12H  . folic acid  1 mg Oral Daily  .  HYDROmorphone (DILAUDID) injection  2 mg Intravenous Q3H  . HYDROmorphone HCl  16 mg Oral Q24H  . hydroxyurea  500 mg Oral TID  . linezolid  600 mg Oral Q12H  . patient's guide to using coumadin book   Does not apply Once  . piperacillin-tazobactam (ZOSYN)  IV  3.375 g Intravenous Q8H  . sodium chloride  3 mL Intravenous Q12H  . warfarin  5 mg Oral ONCE-1800  . warfarin   Does not apply Once  . Warfarin - Pharmacist Dosing Inpatient   Does not apply q1800   Continuous Infusions: . sodium chloride 75 mL/hr at 12/11/12 0215    Active Problems:   Leukocytosis   Fever   Sickle cell crisis   Internal jugular (IJ) vein thromboembolism, acute   Septic thrombophlebitis   Screen for STD (sexually transmitted disease)   Thrombocytopathia    Time spent: 40 minutes.    [facility]  [facility] Pager [phone].   If 8PM-8AM, please contact night-coverage at [email or web address], password [facility] 12/11/2012, 2:02 PM  LOS: 1 day

## 2012-12-11 NOTE — Progress Notes (Signed)
ANTIBIOTIC CONSULT NOTE - INITIAL  Pharmacy Consult for Vancomycin and Zosyn  Indication:  Fever, leukocytosis, : possibly infectious source from the thrombophlebitis   Allergies  Allergen Reactions  . Morphine And Related Hives    Patient states can take Dilaudid with benadryl  . Adhesive (Tape)   . Latex     Patient Measurements: Height: 5\' 5"  (165.1 cm) Weight: 131 lb 1.6 oz (59.467 kg) IBW/kg (Calculated) : 61.5 Adjusted Body Weight:   Vital Signs: Temp: 99 F (37.2 C) (07/01 0203) Temp src: Oral (07/01 0203) BP: 111/51 mmHg (07/01 0203) Pulse Rate: 73 (07/01 0203) Intake/Output from previous day:   Intake/Output from this shift:    Labs:  Recent Labs  12/10/12 1914  WBC 36.0*  HGB 7.5*  PLT 477*  CREATININE 0.73   Estimated Creatinine Clearance: 109.5 ml/min (by C-G formula based on Cr of 0.73). No results found for this basename: VANCOTROUGH, Leodis Binet, VANCORANDOM, GENTTROUGH, GENTPEAK, GENTRANDOM, TOBRATROUGH, TOBRAPEAK, TOBRARND, AMIKACINPEAK, AMIKACINTROU, AMIKACIN,  in the last 72 hours   Microbiology: Recent Results (from the past 720 hour(s))  RAPID STREP SCREEN     Status: None   Collection Time    12/10/12  8:36 PM      Result Value Range Status   Streptococcus, Group A Screen (Direct) NEGATIVE  NEGATIVE Final   Comment: (NOTE)     A Rapid Antigen test may result negative if the antigen level in the     sample is below the detection level of this test. The FDA has not     cleared this test as a stand-alone test therefore the rapid antigen     negative result has reflexed to a Group A Strep culture.    Medical History: Past Medical History  Diagnosis Date  . Sickle cell anemia   . Asthma   . 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 Heart murmur     Medications:  Anti-infectives   Start     Dose/Rate Route Frequency Ordered Stop   12/11/12 0100  piperacillin-tazobactam (ZOSYN) IVPB 3.375 g     3.375 g 12.5 mL/hr over 240 Minutes  Intravenous 3 times per day 12/11/12 0050     12/11/12 0000  vancomycin (VANCOCIN) IVPB 750 mg/150 ml premix     750 mg 150 mL/hr over 60 Minutes Intravenous Every 8 hours 12/10/12 2351       Assessment: Patient with Fever, leukocytosis, : possibly infectious source from the thrombophlebitis.  First dose of antibiotics already given.  Goal of Therapy:  Vancomycin trough level 15-20 mcg/ml Zosyn based on renal function   Plan:  Measure antibiotic drug levels at steady state Follow up culture results Vancomycin 750mg  iv q8hr Zosyn 3.375g IV Q8H infused over 4hrs.   Darlina Guys, Jacquenette Shone Crowford 12/11/2012,3:10 AM

## 2012-12-11 NOTE — H&P (Addendum)
[facility] History and Physical  Christopher Warren ZOX:[id number] DOB: 02/05/1976 DOA: 12/10/2012  Referring physician: Dr Blinda Leatherwood PCP: [name], MD  Specialists: Dr Myna Hidalgo  Chief Complaint: back pain, neck pain  HPI: Christopher Warren is a 37 y.o. male with h/o SS disease, s/p spleenectomy, recurrent admissions to ED for sickle cell pain crisis, sees Dr Myna Hidalgo as outpatient for his sickle cell disease, came in today complaining of back pain, pain in the extremities, and neck pain on the right side over the last 3 to 4 days. He also reports swelling of the right side of the neck and painful ROM of the neck. He reports subjective fevers, no palpitations, chest pain, or sob. No nausea,vomiting or diarrhea. On arrival to ED, he underwent a CT of the neck, showed occlusive thrombus of the RIJ, with surrounding inflammatory stranding int he lower part of the neck. His labs revealed a WBC count of 36,000, hemoglobin of 7.5. He got the right port a cath 2 years ago. He was also febrile on arrival. Blood cultures were drawn. Pt was put on coumadin for the R port a cath, which he reports non compliance. He was referred to hospitalist service for further evaluation and management.   Review of Systems: The patient denies anorexia, weight loss,, vision loss, decreased hearing, hoarseness, chest pain, syncope, dyspnea on exertion, peripheral edema, balance deficits, hemoptysis, abdominal pain, melena, hematochezia, severe indigestion/heartburn, hematuria, incontinence, genital sores, muscle weakness, suspicious skin lesions, transient blindness, difficulty walking, depression, unusual weight change, abnormal bleeding, enlarged lymph nodes, angioedema, and breast masses.    Past Medical History  Diagnosis Date  . Sickle cell anemia   . Asthma   . 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 Heart murmur    Past Surgical History  Procedure Laterality Date  . Cholecystectomy    . Splenectomy,  total  04/26/2012    Procedure: SPLENECTOMY;  Surgeon: Shelly Rubenstein, MD;  Location: [facility];  Service: General;  Laterality: N/A;   Social History:  reports that he has been smoking Cigarettes.  He has a 5 pack-year smoking history. He has never used smokeless tobacco. He reports that he does not drink alcohol or use illicit drugs. where does patient live--home,  Allergies  Allergen Reactions  . Morphine And Related Hives    Patient states can take Dilaudid with benadryl  . Adhesive (Tape)   . Latex     Family History  Problem Relation Age of Onset  . Sickle cell anemia Mother   . Sickle cell anemia Son     Prior to Admission medications   Medication Sig Start Date End Date Taking? Authorizing Provider  albuterol (PROVENTIL HFA;VENTOLIN HFA) 108 (90 BASE) MCG/ACT inhaler Inhale 2 puffs into the lungs every 6 (six) hours as needed for wheezing. 02/14/12 02/13/13 Yes [name], MD  deferasirox (EXJADE) 500 MG disintegrating tablet Take 4 tablets (2,000 mg total) by mouth daily. 12/05/12  Yes [name], MD  diphenhydrAMINE (BENADRYL) 25 mg capsule Take 25 mg by mouth every 6 (six) hours as needed. Itching.   Yes Historical Provider, MD  folic acid (FOLVITE) 1 MG tablet Take 2 pills a day. 10/12/12  Yes [name], MD  HYDROmorphone (DILAUDID) 8 MG tablet Take 1 tablet (8 mg total) by mouth every 6 (six) hours as needed. Please fill on 5/29 12/05/12  Yes [name], MD  HYDROmorphone HCl 16 MG T24A Take 1 tablet (16 mg total) by mouth daily.  Please fill on 5/29. 12/05/12  Yes [name], MD  hydroxyurea (HYDREA) 500 MG capsule Take 1 capsule (500 mg total) by mouth 3 (three) times daily. 12/05/12  Yes [name], MD  promethazine (PHENERGAN) 6.25 MG/5ML syrup Take 10 mLs (12.5 mg total) by mouth 4 (four) times daily as needed for nausea. 09/10/12  Yes [name], MD  temazepam (RESTORIL) 15 MG capsule Take 1 capsule (15 mg total) by mouth at bedtime as needed  for sleep. 09/10/12  Yes [name], MD  warfarin (COUMADIN) 2 MG tablet Take 1 tablet (2 mg total) by mouth daily. 09/10/12 09/10/13 Yes [name], MD   Physical Exam: Filed Vitals:   12/10/12 1829 12/10/12 1834 12/10/12 1846 12/10/12 2239  BP:  124/66 132/64   Pulse:  86 101   Temp:  100.3 F (37.9 C) 100.5 F (38.1 C)   TempSrc: Oral Oral Oral   Resp:  18 17   Height:    5' 4.96" (1.65 m)  Weight:    58.06 kg (128 lb)  SpO2:  100% 93%     Constitutional: Vital signs reviewed.  Patient is a well-developed and well-nourished  in no acute distress and cooperative with exam. Alert and oriented x3.  Head: Normocephalic and atraumatic Mouth: no erythema or exudates, MMM Eyes: PERRL, EOMI, conjunctivae normal, No scleral icterus.  Neck:supple, trachea midline, swelling over the right side, with some redness and tenderness over the RIJ.  Cardiovascular: RRR, S1 normal, S2 normal, mumur heard Pulmonary/Chest: normal respiratory effort, CTAB, no wheezes, rales, or rhonchi Abdominal: Soft. Non-tender, non-distended, bowel sounds are normal, no masses, organomegaly, or guarding present.  Musculoskeletal: No joint deformities, erythema, or stiffness, ROM full and no nontender Neurological: A&O x3, no deficits. Able to move all extremities.     Labs on Admission:  Basic Metabolic Panel:  Recent Labs Lab 12/10/12 1914  NA 138  K 3.6  CL 102  CO2 27  GLUCOSE 112*  BUN 8  CREATININE 0.73  CALCIUM 8.7   Liver Function Tests: No results found for this basename: AST, ALT, ALKPHOS, BILITOT, PROT, ALBUMIN,  in the last 168 hours No results found for this basename: LIPASE, AMYLASE,  in the last 168 hours No results found for this basename: AMMONIA,  in the last 168 hours CBC:  Recent Labs Lab 12/10/12 1914  WBC 36.0*  NEUTROABS 28.8*  HGB 7.5*  HCT 19.8*  MCV 90.4  PLT 477*   Cardiac Enzymes: No results found for this basename: CKTOTAL, CKMB, CKMBINDEX, TROPONINI,   in the last 168 hours  BNP (last 3 results) No results found for this basename: PROBNP,  in the last 8760 hours CBG: No results found for this basename: GLUCAP,  in the last 168 hours  Radiological Exams on Admission: Ct Soft Tissue Neck W Contrast  12/10/2012   *RADIOLOGY REPORT*  Clinical Data: The neck swelling, pain  CT NECK WITH CONTRAST  Technique:  Multidetector CT imaging of the neck was performed with intravenous contrast.  Contrast: 80mL OMNIPAQUE IOHEXOL 300 MG/ML  SOLN  Comparison: Prior radiograph from 10/05/2012.  Findings: The visualized portions of the intracranial brain are unremarkable.  The orbits are unremarkable.  The paranasal sinuses are clear.  The oral cavity, oropharynx, and nasopharynx is unremarkable. Heights and hypopharynx is unremarkable.  The thyroid is normal.  No pathologically enlarged lymph nodes are identified within the neck.  A filling defect is seen within the mildly expanded right  internal jugular vein, proximal to the insertion of the right-sided Port-A- Cath (series 6, image 56). Distally, the right internal jugular vein appears completely occluded.  There is associated inflammatory stranding within the soft tissues around the occluded right internal jugular vein in the lower right neck, deep to the right sternocleidomastoid muscle.  The right common, internal, and external carotid arteries are normal. The left internal jugular vein is patent.  The partially visualized lungs are clear.  No osseous abnormalities are identified.  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   Original Report Authenticated By: Rise Mu, M.D.    EKG: not done.   Assessment/Plan Active Problems:  1. Right IJ occlusive thrombus:  - admit to telemetry.  - start pt on IV heparin, with coumadin . INR sub therapeutic.  -  start pt on IV vancomycin and IV zosyn for possible thrombophlebitis, .  - blood cultures drawn.  - no airway compromise at this time from the right neck swelling. Call Vascular surgery as needed.  - please ID consult in am for further recommendations.   2. Fever, leukocytosis, : possibly infectious source from the thrombophlebitis.  - the port a cath area looks good, no erythema,or swelling.  - blood cultures drawn, CXR ordered.  - IV vancomycin and IV zosyn ordered empirically.  - pro calcitonin levels ordered.   3. Sickle cell pain ful crisis: - IV fluids,  - IV pain control and anti emetics.  - Hemoglobin is 7.5, with a h.o iron overload. Will not transfuse the patient at this time.  - resume home medications of exjade, folic acid, and hydrea. -pt follow [place] with Dr Myna Hidalgo for sickle cell, please call Dr Myna Hidalgo in am.    4. Smoking: cessation encouraged.   5. DVT prophylaxis.   Code Status: full code Family Communication: family/ friend at bedside Disposition Plan: pending.   Time spent: 90 min  Ellese Julius [facility] Pager [phone]  If 7PM-7AM, please contact night-coverage [email or web address] Password [facility] 12/11/2012, 12:17 AM

## 2012-12-12 DIAGNOSIS — D571 Sickle-cell disease without crisis: Secondary | ICD-10-CM

## 2012-12-12 DIAGNOSIS — T80218A Other infection due to central venous catheter, initial encounter: Secondary | ICD-10-CM

## 2012-12-12 LAB — CBC
HCT: 16.7 % — ABNORMAL LOW (ref 39.0–52.0)
Hemoglobin: 6.3 g/dL — CL (ref 13.0–17.0)
MCH: 34.2 pg — ABNORMAL HIGH (ref 26.0–34.0)
MCV: 90.8 fL (ref 78.0–100.0)
Platelets: 396 10*3/uL (ref 150–400)
RBC: 1.84 MIL/uL — ABNORMAL LOW (ref 4.22–5.81)
WBC: 27.2 10*3/uL — ABNORMAL HIGH (ref 4.0–10.5)

## 2012-12-12 LAB — CULTURE, GROUP A STREP

## 2012-12-12 LAB — HEMOGLOBIN AND HEMATOCRIT, BLOOD
HCT: 22.1 % — ABNORMAL LOW (ref 39.0–52.0)
Hemoglobin: 8 g/dL — ABNORMAL LOW (ref 13.0–17.0)

## 2012-12-12 LAB — PREPARE RBC (CROSSMATCH)

## 2012-12-12 MED ORDER — ACETAMINOPHEN 325 MG PO TABS
650.0000 mg | ORAL_TABLET | Freq: Four times a day (QID) | ORAL | Status: DC | PRN
Start: 2012-12-12 — End: 2012-12-17
  Administered 2012-12-12: 650 mg via ORAL
  Filled 2012-12-12: qty 2

## 2012-12-12 MED ORDER — PIPERACILLIN-TAZOBACTAM 3.375 G IVPB
3.3750 g | Freq: Three times a day (TID) | INTRAVENOUS | Status: DC
  Administered 2012-12-12 – 2012-12-14 (×6): 3.375 g via INTRAVENOUS
  Filled 2012-12-12 (×7): qty 50

## 2012-12-12 MED ORDER — WARFARIN SODIUM 7.5 MG PO TABS
7.5000 mg | ORAL_TABLET | Freq: Once | ORAL | Status: AC
  Administered 2012-12-12: 7.5 mg via ORAL
  Filled 2012-12-12: qty 1

## 2012-12-12 MED ORDER — HYDROMORPHONE HCL PF 2 MG/ML IJ SOLN
2.0000 mg | INTRAMUSCULAR | Status: DC
  Administered 2012-12-12 – 2012-12-14 (×24): 2 mg via INTRAVENOUS
  Filled 2012-12-12 (×24): qty 1

## 2012-12-12 MED ORDER — PIPERACILLIN-TAZOBACTAM 3.375 G IVPB
3.3750 g | Freq: Once | INTRAVENOUS | Status: AC
  Administered 2012-12-12: 3.375 g via INTRAVENOUS
  Filled 2012-12-12: qty 50

## 2012-12-12 NOTE — Progress Notes (Signed)
Patient asleep upon entering room. VS taken for blood transfusion. Patient awoke and very angry because he cannot receive IV dilaudid while receiving blood transfusion. Patient stated that he was not going to take lovenox and coumadin until he received his IV medication. Patient educated patient about the importance of taking his medication.  After discussion, patient finally agreed to take medication. Will continue to closely monitor patient and keep comfortable. Setzer, Don Broach

## 2012-12-12 NOTE — Progress Notes (Signed)
Patient has two units of blood ordered. Patient is refusing blood transfusion at this time until he speaks with MD.  Will continue to monitor patient. Setzer, Don Broach

## 2012-12-12 NOTE — Progress Notes (Signed)
[facility] PROGRESS NOTE  LEELYND MALDONADO ZOX:[id number] DOB: 03/11/1976 DOA: 12/10/2012 PCP: No primary provider on file.  Assessment/Plan: Active Problems:   Leukocytosis   Fever   Sickle cell crisis   Internal jugular (IJ) vein thromboembolism, acute   Septic thrombophlebitis   Screen for STD (sexually transmitted disease)   Thrombocytopathia    1. Right internal jugular DVT +/- septic thrombophlebitis: Patient presented with right-sided neck pain and CT scan revealed occlusive thrombus within the right internal jugular vein, with associated inflammatory stranding in the lower right neck, proximal to the insertion of the right-sided Port-A-Cath. Managed initially with iv Heparin infusion, as well as Coumadin per pharmacy (he had been on low dose Coumadin PTA). Switched to Lovenox bridge on 12/11/12. Started on Vancomycin/Zosyn, but Dr Daiva Eves provided ID consultation, and has recommended Zyvox in place of Vancomycin. Blood cultures and throat strept test are negative so far. Per ID, if blood cultures positive, his Port-A-Cath needs to come out. If blood cultures negative-? Continued anticoagulation and reimaging/ultrasound for recanalization versus Port-A-Cath removal. Patient is afebrile, and wcc is trending down.  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 Managing with hypotonic IV fluids and pain management. 3. Sickle cell anemia: Baseline hemoglobin appears to be 7.5-8.0. HB is 6.3 today. For transfusion of 2 units PRBC today. 4. Iron overload. Patient has a history of iron overload, and pre-admission, was on Exjade therapy. Managing with Desferrioxamine. Will restart Exjade obn discharge.  5. Leukocytosis: Appears chronic? Baseline 15-20. But appears exacerbated by current infective process. Patient already on antibiotics. Following CBC.  Code  Status: Full Code.  Family Communication:  Disposition Plan: To be determined.    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 In the ED, temperature 100.15F WBC 36K, hemoglobin 7.5, platelets 477, reticulocytes 412 and CT of the neck showed occlusive thrombus within the right internal jugular vein with associated inflammatory stranding in the lower right neck proximal to the insertion of the right sided Port-A-Cath. Hospitalist admission was requested. [facility] apparently [facility] due to hostile behavior. He currently does not have a PCP or hematologist.   Consultants:  Dr Daiva Eves, ID.  Procedures:  CT neck.  CXR.   Antibiotics:  Zyvox 12/11/12>>>  Zosyn 12/10/12>>>  HPI/Subjective: C/O generalized pain.   Objective: Vital signs in last 24 hours: Temp:  [98.9 F (37.2 C)-100 F (37.8 C)] 98.9 F (37.2 C) (07/02 0537) Pulse Rate:  [92-100] 92 (07/02 0537) Resp:  [20] 20 (07/02 0537) BP: (115-127)/(45-52) 115/52 mmHg (07/02 0537) SpO2:  [92 %-95 %] 92 % (07/02 0537) Weight change:  Last BM Date: 12/10/12  Intake/Output from previous day: 07/01 0701 - 07/02 0700 In: 3112.4 [P.O.:520; I.V.:1992.4; IV Piggyback:600] Out: 1185 [Urine:1185] Total I/O In: 120 [P.O.:120] Out: -    Physical Exam: General: Comfortable, alert, communicative, fully oriented, not short of breath at rest. Garrulous.  HEENT:  Moderate clinical pallor, mildly jaundiced, no conjunctival injection or discharge. NECK:  Supple, JVP not seen, no carotid bruits, no palpable lymphadenopathy, no palpable goiter. Has palpable cordlike and tender right IJ vein.  CHEST:  Clinically clear to auscultation,  no wheezes, no  crackles. HEART:  Sounds 1 and 2 heard, normal, regular, no murmurs. ABDOMEN:  Full, soft, non-tender, no palpable organomegaly, no palpable masses, normal bowel sounds. GENITALIA:  Not examined. LOWER EXTREMITIES:  No pitting edema, palpable peripheral pulses. MUSCULOSKELETAL SYSTEM:  Unremarkable. CENTRAL NERVOUS SYSTEM:  No focal neurologic deficit on gross examination.  Lab Results:  Recent Labs  12/11/12 0715 12/12/12 0355  WBC 26.3* 27.2*  HGB 7.1* 6.3*  HCT 18.8* 16.7*  PLT 431* 396    Recent Labs  12/10/12 1914 12/11/12 0715  NA 138 137  K 3.6 3.6  CL 102 102  CO2 27 27  GLUCOSE 112* 92  BUN 8 8  CREATININE 0.73 0.61  CALCIUM 8.7 8.8   Recent Results (from the past 240 hour(s))  CULTURE, BLOOD (ROUTINE X 2)     Status: None   Collection Time    12/10/12  7:14 PM      Result Value Range Status   Specimen Description BLOOD   Final   Special Requests BOTTLES DRAWN AEROBIC AND ANAEROBIC   Final   Culture  Setup Time 12/11/2012 01:12   Final   Culture     Final   Value:        BLOOD CULTURE RECEIVED NO GROWTH TO DATE CULTURE WILL BE HELD FOR 5 DAYS BEFORE ISSUING A FINAL NEGATIVE REPORT   Report Status PENDING   Incomplete  RAPID STREP SCREEN     Status: None   Collection Time    12/10/12  8:36 PM      Result Value Range Status   Streptococcus, Group A Screen (Direct) NEGATIVE  NEGATIVE Final   Comment: (NOTE)     A Rapid Antigen test may result negative if the antigen level in the     sample is below the detection level of this test. The FDA has not     cleared this test as a stand-alone test therefore the rapid antigen     negative result has reflexed to a Group A Strep culture.  CULTURE, GROUP A STREP     Status: None   Collection Time    12/10/12  8:36 PM      Result Value Range Status   Specimen Description THROAT   Final   Special Requests NONE   Final   Culture NO SUSPICIOUS COLONIES, CONTINUING TO HOLD   Final   Report Status PENDING    Incomplete  CULTURE, BLOOD (ROUTINE X 2)     Status: None   Collection Time    12/10/12  8:48 PM      Result Value Range Status   Specimen Description BLOOD RIGHT AC   Final   Special Requests BOTTLES DRAWN AEROBIC AND ANAEROBIC 3 CC   Final   Culture  Setup Time 12/11/2012 01:12   Final   Culture     Final   Value:        BLOOD CULTURE RECEIVED NO GROWTH TO DATE CULTURE WILL BE HELD FOR 5 DAYS BEFORE ISSUING A FINAL NEGATIVE REPORT   Report Status PENDING   Incomplete     Studies/Results: X-ray Chest Pa And Lateral   12/11/2012   *RADIOLOGY REPORT*  Clinical Data: Right-sided port catheter is painful with pain and swelling involving the right side of the neck  CHEST - 2 VIEW  Comparison: 10/05/2012; 04/24/2012; 10/03/2011  Findings:  Grossly unchanged borderline enlarged cardiac silhouette.  Normal mediastinal contours.  Stable positioning of support apparatus.  No pneumothorax.  Overall improved aeration of the lungs with persistent mild diffuse slightly nodular thickening of the pulmonary interstitium with pleural parenchymal thickening along the right minor fissure. No focal airspace opacities.  There is chronic blunting of the right costophrenic angle without definite pleural effusion.  No definite evidence of edema.  No pneumothorax or evidence of edema.  Unchanged bones.  Post cholecystectomy.  IMPRESSION: 1.  Overall improved aeration of the lungs with persistent interstitial thickening / bronchitic change. 2. Stable positioning of support apparatus.   Original Report Authenticated By: Tacey Ruiz, MD   Ct Soft Tissue Neck W Contrast  12/10/2012   *RADIOLOGY REPORT*  Clinical Data: The neck swelling, pain  CT NECK WITH CONTRAST  Technique:  Multidetector CT imaging of the neck was performed with intravenous contrast.  Contrast: 80mL OMNIPAQUE IOHEXOL 300 MG/ML  SOLN  Comparison: Prior radiograph from 10/05/2012.  Findings: The visualized portions of the intracranial brain are unremarkable.   The orbits are unremarkable.  The paranasal sinuses are clear.  The oral cavity, oropharynx, and nasopharynx is unremarkable. Heights and hypopharynx is unremarkable.  The thyroid is normal.  No pathologically enlarged lymph nodes are identified within the neck.  A filling defect is seen within the mildly expanded right internal jugular vein, proximal to the insertion of the right-sided Port-A- Cath (series 6, image 56). Distally, the right internal jugular vein appears completely occluded.  There is associated inflammatory stranding within the soft tissues around the occluded right internal jugular vein in the lower right neck, deep to the right sternocleidomastoid muscle.  The right common, internal, and external carotid arteries are normal. The left internal jugular vein is patent.  The partially visualized lungs are clear.  No osseous abnormalities are identified.  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   Original Report Authenticated By: Rise Mu, M.D.    Medications: Scheduled Meds: . deferoxamine (DESFERAL) IV  2,000 mg Intravenous QHS  . enoxaparin (LOVENOX) injection  60 mg Subcutaneous Q12H  . folic acid  1 mg Oral Daily  . HYDROmorphone HCl  16 mg Oral Q24H  . hydroxyurea  500 mg Oral TID  . linezolid  600 mg Oral Q12H  . piperacillin-tazobactam (ZOSYN)  IV  3.375 g Intravenous Q8H  . piperacillin-tazobactam (ZOSYN)  IV  3.375 g Intravenous Once  . sodium chloride  3 mL Intravenous Q12H  . Warfarin - Pharmacist Dosing Inpatient   Does not apply q1800   Continuous Infusions: . sodium chloride 75 mL/hr at 12/11/12 2332   PRN Meds:.acetaminophen, albuterol, diphenhydrAMINE, HYDROmorphone, ondansetron (ZOFRAN) IV, ondansetron, promethazine, temazepam    LOS: 2 days   Imran Nuon,CHRISTOPHER  [facility] Pager [phone]. If  8PM-8AM, please contact night-coverage at [email or web address], password [facility] 12/12/2012, 11:27 AM  LOS: 2 days

## 2012-12-12 NOTE — Progress Notes (Signed)
Patient agreed to have a blood transfusion.  Patient does not want to start transfusion until after getting a shower. Patient educated about the importance of receiving blood transfusion at the time. Will start blood transfusion after patient finished shower. Setzer, Don Broach

## 2012-12-12 NOTE — Progress Notes (Signed)
ANTICOAGULATION CONSULT NOTE - Follow-up Consult  Pharmacy Consult for Lovenox/warfarin  Indication: DVT in R IJ vein  Allergies  Allergen Reactions  . Morphine And Related Hives    Patient states can take Dilaudid with benadryl  . Adhesive (Tape)   . Latex     Patient Measurements: Height: 5\' 5"  (165.1 cm) Weight: 131 lb 1.6 oz (59.467 kg) IBW/kg (Calculated) : 61.5 Heparin Dosing Weight:   Vital Signs: Temp: 98.9 F (37.2 C) (07/02 0537) Temp src: Oral (07/02 0537) BP: 115/52 mmHg (07/02 0537) Pulse Rate: 92 (07/02 0537)  Labs:  Recent Labs  12/10/12 1914 12/10/12 2210 12/11/12 0715 12/12/12 0355  HGB 7.5*  --  7.1* 6.3*  HCT 19.8*  --  18.8* 16.7*  PLT 477*  --  431* 396  APTT  --  44*  --   --   LABPROT  --  15.1 15.0 15.7*  INR  --  1.22 1.21 1.28  HEPARINUNFRC  --   --  0.28*  --   CREATININE 0.73  --  0.61  --     Estimated Creatinine Clearance: 109.5 ml/min (by C-G formula based on Cr of 0.61).   Medical History: Past Medical History  Diagnosis Date  . Sickle cell anemia   . Asthma   . 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 Heart murmur     Medications:  Scheduled:  . deferoxamine (DESFERAL) IV  2,000 mg Intravenous QHS  . enoxaparin (LOVENOX) injection  60 mg Subcutaneous Q12H  . folic acid  1 mg Oral Daily  .  HYDROmorphone (DILAUDID) injection  2 mg Intravenous Q3H  . HYDROmorphone HCl  16 mg Oral Q24H  . hydroxyurea  500 mg Oral TID  . linezolid  600 mg Oral Q12H  . piperacillin-tazobactam (ZOSYN)  IV  3.375 g Intravenous Q8H  . sodium chloride  3 mL Intravenous Q12H  . Warfarin - Pharmacist Dosing Inpatient   Does not apply q1800   Infusions:  . sodium chloride 75 mL/hr at 12/11/12 2332    Assessment: 36 YOM with sickle-cell disease, presents with acute pain crisis, including pain and swelling to R side of neck. CT revealed occlusive thrombus of R IJ proximal to port-a-cath.  He is on (low-dose) warfarin prior to admission for  port-a-cath "maintenance."  He has been started on heparin gtt and resumed warfarin.  Home warfarin dose was 2mg  (again for catheter maintenance, not for TREATMENT)  INR today is 1.28  CBC: hgb = 6.3 (suspect most likely d/t acute sickle-cell crisis), plts WNL  Heparin changed to lovenox 7/1  Goal of Therapy:  INR 2-3 Heparin level 0.3-0.7 units/ml Monitor platelets by anticoagulation protocol: Yes   Plan:   D#2 of minimum 5 day overlap for acute VTE  Coumadin 7.5mg  tonight   Continue lovenox 60mg  SQ q12h  Daily INR  ?? Who will monitor warfarin/INRs as outpatient (d/w Dr. Brien Few, plan TBD based on hospital course)  Juliette Alcide, PharmD, BCPS.   Pager: [phone]  12/12/2012,7:07 AM

## 2012-12-12 NOTE — Progress Notes (Signed)
[facility]    Subjective: Feels neck pain is worse and difficulty turning head   Antibiotics:  Anti-infectives   Start     Dose/Rate Route Frequency Ordered Stop   12/12/12 2000  piperacillin-tazobactam (ZOSYN) IVPB 3.375 g     3.375 g 12.5 mL/hr over 240 Minutes Intravenous Every 8 hours 12/12/12 1107     12/12/12 1130  piperacillin-tazobactam (ZOSYN) IVPB 3.375 g     3.375 g 100 mL/hr over 30 Minutes Intravenous  Once 12/12/12 1107 12/12/12 1139   12/11/12 1300  linezolid (ZYVOX) tablet 600 mg     600 mg Oral Every 12 hours 12/11/12 1135     12/11/12 0100  piperacillin-tazobactam (ZOSYN) IVPB 3.375 g  Status:  Discontinued     3.375 g 12.5 mL/hr over 240 Minutes Intravenous 3 times per day 12/11/12 0050 12/12/12 1107   12/11/12 0000  vancomycin (VANCOCIN) IVPB 750 mg/150 ml premix  Status:  Discontinued     750 mg 150 mL/hr over 60 Minutes Intravenous Every 8 hours 12/10/12 2351 12/11/12 1135      Medications: Scheduled Meds: . deferoxamine (DESFERAL) IV  2,000 mg Intravenous QHS  . enoxaparin (LOVENOX) injection  60 mg Subcutaneous Q12H  . folic acid  1 mg Oral Daily  .  HYDROmorphone (DILAUDID) injection  2 mg Intravenous Q2H  . HYDROmorphone HCl  16 mg Oral Q24H  . hydroxyurea  500 mg Oral TID  . linezolid  600 mg Oral Q12H  . piperacillin-tazobactam (ZOSYN)  IV  3.375 g Intravenous Q8H  . sodium chloride  3 mL Intravenous Q12H  . warfarin  7.5 mg Oral ONCE-1800  . Warfarin - Pharmacist Dosing Inpatient   Does not apply q1800   Continuous Infusions: . sodium chloride 75 mL/hr at 12/11/12 2332   PRN Meds:.acetaminophen, albuterol, diphenhydrAMINE, HYDROmorphone, ondansetron (ZOFRAN) IV, ondansetron, promethazine, temazepam   Objective: Weight change:   Intake/Output Summary (Last 24 hours) at 12/12/12 1444 Last data filed at 12/12/12 1300  Gross per 24 hour  Intake 3272.5 ml  Output   1185 ml  Net 2087.5 ml   Blood pressure  102/46, pulse 101, temperature 99.2 F (37.3 C), temperature source Oral, resp. rate 18, height 5\' 5"  (1.651 m), weight 131 lb 1.6 oz (59.467 kg), SpO2 93.00%. Temp:  [98.9 F (37.2 C)-100 F (37.8 C)] 99.2 F (37.3 C) (07/02 1428) Pulse Rate:  [92-101] 101 (07/02 1428) Resp:  [18-20] 18 (07/02 1428) BP: (102-117)/(46-52) 102/46 mmHg (07/02 1428) SpO2:  [92 %-95 %] 93 % (07/02 1428)  Physical Exam: HEENT: pupils reactive to light and accommodation, EOMI, oropharynx clear and without exudate, neck is swollen and tender to palpation  CVS regular rate, normal r, II/VI systolic murmur  Chest: diminished bs at left base but otherwise clear  Abdomen: soft nondistended, normal bowel sounds,  Extremities: no clubbing or edema noted bilaterally  Skin: tunnelled catheter not overltly swollen or hot  Neuro: nonfocal, strength and sensation intact   Lab Results:  Recent Labs  12/11/12 0715 12/12/12 0355  WBC 26.3* 27.2*  HGB 7.1* 6.3*  HCT 18.8* 16.7*  PLT 431* 396    BMET  Recent Labs  12/10/12 1914 12/11/12 0715  NA 138 137  K 3.6 3.6  CL 102 102  CO2 27 27  GLUCOSE 112* 92  BUN 8 8  CREATININE 0.73 0.61  CALCIUM 8.7 8.8    Micro Results: Recent Results (from the past 240 hour(s))  CULTURE,  BLOOD (ROUTINE X 2)     Status: None   Collection Time    12/10/12  7:14 PM      Result Value Range Status   Specimen Description BLOOD   Final   Special Requests BOTTLES DRAWN AEROBIC AND ANAEROBIC   Final   Culture  Setup Time 12/11/2012 01:12   Final   Culture     Final   Value:        BLOOD CULTURE RECEIVED NO GROWTH TO DATE CULTURE WILL BE HELD FOR 5 DAYS BEFORE ISSUING A FINAL NEGATIVE REPORT   Report Status PENDING   Incomplete  RAPID STREP SCREEN     Status: None   Collection Time    12/10/12  8:36 PM      Result Value Range Status   Streptococcus, Group A Screen (Direct) NEGATIVE  NEGATIVE Final   Comment: (NOTE)     A Rapid Antigen test may result negative if the  antigen level in the     sample is below the detection level of this test. The FDA has not     cleared this test as a stand-alone test therefore the rapid antigen     negative result has reflexed to a Group A Strep culture.  CULTURE, GROUP A STREP     Status: None   Collection Time    12/10/12  8:36 PM      Result Value Range Status   Specimen Description THROAT   Final   Special Requests NONE   Final   Culture No Beta Hemolytic Streptococci Isolated   Final   Report Status 12/12/2012 FINAL   Final  CULTURE, BLOOD (ROUTINE X 2)     Status: None   Collection Time    12/10/12  8:48 PM      Result Value Range Status   Specimen Description BLOOD RIGHT AC   Final   Special Requests BOTTLES DRAWN AEROBIC AND ANAEROBIC 3 CC   Final   Culture  Setup Time 12/11/2012 01:12   Final   Culture     Final   Value:        BLOOD CULTURE RECEIVED NO GROWTH TO DATE CULTURE WILL BE HELD FOR 5 DAYS BEFORE ISSUING A FINAL NEGATIVE REPORT   Report Status PENDING   Incomplete    Studies/Results: X-ray Chest Pa And Lateral   12/11/2012   *RADIOLOGY REPORT*  Clinical Data: Right-sided port catheter is painful with pain and swelling involving the right side of the neck  CHEST - 2 VIEW  Comparison: 10/05/2012; 04/24/2012; 10/03/2011  Findings:  Grossly unchanged borderline enlarged cardiac silhouette.  Normal mediastinal contours.  Stable positioning of support apparatus.  No pneumothorax.  Overall improved aeration of the lungs with persistent mild diffuse slightly nodular thickening of the pulmonary interstitium with pleural parenchymal thickening along the right minor fissure. No focal airspace opacities.  There is chronic blunting of the right costophrenic angle without definite pleural effusion.  No definite evidence of edema.  No pneumothorax or evidence of edema.  Unchanged bones.  Post cholecystectomy.  IMPRESSION: 1.  Overall improved aeration of the lungs with persistent interstitial thickening / bronchitic  change. 2. Stable positioning of support apparatus.   Original Report Authenticated By: Tacey Ruiz, MD   Ct Soft Tissue Neck W Contrast  12/10/2012   *RADIOLOGY REPORT*  Clinical Data: The neck swelling, pain  CT NECK WITH CONTRAST  Technique:  Multidetector CT imaging of the neck was performed with intravenous  contrast.  Contrast: 80mL OMNIPAQUE IOHEXOL 300 MG/ML  SOLN  Comparison: Prior radiograph from 10/05/2012.  Findings: The visualized portions of the intracranial brain are unremarkable.  The orbits are unremarkable.  The paranasal sinuses are clear.  The oral cavity, oropharynx, and nasopharynx is unremarkable. Heights and hypopharynx is unremarkable.  The thyroid is normal.  No pathologically enlarged lymph nodes are identified within the neck.  A filling defect is seen within the mildly expanded right internal jugular vein, proximal to the insertion of the right-sided Port-A- Cath (series 6, image 56). Distally, the right internal jugular vein appears completely occluded.  There is associated inflammatory stranding within the soft tissues around the occluded right internal jugular vein in the lower right neck, deep to the right sternocleidomastoid muscle.  The right common, internal, and external carotid arteries are normal. The left internal jugular vein is patent.  The partially visualized lungs are clear.  No osseous abnormalities are identified.  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   Original Report Authenticated By: Rise Mu, M.D.      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, chronic porta-cath admitted with neck pain subjective chills found to have occlusive thrombus associated with portacath currently on heparin, zosyn and vancomycin (though vanco and heparin not compatible via same  line)   #1 IJ thrombus +/-septic thrombophlebitis:   NOT CLEARLY a case of septic thrombophlebitis but reasonable to cover for this pending cultures and further investigation   --continue zosyn , linezolid  --Certainly IF this is case of septic thrombophlebitis then porta-cath MUST come out (and would consult [facility] re this) and additionally he would need thrombectomy by [facility]   --Given that the clot formed around a porta-cath I would think that portacth needs to come out regardless to remove nidus for clot, and if there is still equivocation for infected clot examinationn and culture of area and porta cath itself could be informative.  If catheter NOT removed is the plan to keep him on protracted to lifelong anticoagulation because of a catheter???  Does he really need this for exhange transfusions, or could he go without it?    #2 Screening: check HIV       LOS: 2 days   Acey Lav 12/12/2012, 2:44 PM

## 2012-12-12 NOTE — Progress Notes (Signed)
CRITICAL VALUE ALERT  Critical value received:  Hemoglobin 6.3  Date of notification:  12/12/12  Time of notification:  0425  Critical value read back:yes  Nurse who received alert:  Donzetta Kohut, RN  MD notified (1st page):  Merdis Delay, NP  Time of first page:  [phone]  MD notified (2nd page):  Time of second page:  Responding MD:  Merdis Delay, NP  Time MD responded:  [phone]

## 2012-12-12 NOTE — Progress Notes (Signed)
Nutrition Education Note  RD drawn to chart secondary to current prescription of Zyvox, a medication with potential interactions with high tyramine-containing foods.  RD provided "Tyramine-Restricted Nutrition Therapy" handout from the [employer] and discussed importance of this restriction while taking current medication. Reviewed list of foods to avoid. Teach back method used.  Expect good compliance.  Body mass index is 21.82 kg/(m^2). Pt meets criteria for normal weight based on current BMI.  Current diet order is regular, patient is consuming approximately 100% of meals at this time. Additional labs and medications reviewed. Pt with elevated bilirubin and yellowing of the eyes.   RD will change current diet to Low Tyramine Diet to prevent potential food-medication interaction.   No further nutrition interventions warranted at this time. RD contact information provided. If additional nutrition issues arise, please re-consult RD.  Levon Hedger MS, RD, LDN [phone] Pager [phone] After Hours Pager

## 2012-12-13 DIAGNOSIS — I809 Phlebitis and thrombophlebitis of unspecified site: Secondary | ICD-10-CM

## 2012-12-13 DIAGNOSIS — I80299 Phlebitis and thrombophlebitis of other deep vessels of unspecified lower extremity: Secondary | ICD-10-CM

## 2012-12-13 DIAGNOSIS — M549 Dorsalgia, unspecified: Secondary | ICD-10-CM

## 2012-12-13 DIAGNOSIS — I82890 Acute embolism and thrombosis of other specified veins: Secondary | ICD-10-CM

## 2012-12-13 DIAGNOSIS — T82898A Other specified complication of vascular prosthetic devices, implants and grafts, initial encounter: Secondary | ICD-10-CM

## 2012-12-13 LAB — TYPE AND SCREEN
ABO/RH(D): O POS
Antibody Screen: NEGATIVE
Unit division: 0
Unit division: 0

## 2012-12-13 LAB — HIV-1 RNA QUANT-NO REFLEX-BLD
HIV 1 RNA Quant: <20 copies/mL (ref <20)
HIV-1 RNA Quant, Log: <1.3 {Log} (ref <1.30)

## 2012-12-13 LAB — BASIC METABOLIC PANEL
BUN: 7 mg/dL (ref 6–23)
Chloride: 100 mEq/L (ref 96–112)
GFR calc non Af Amer: >90 mL/min (ref >90)
Glucose, Bld: 130 mg/dL — ABNORMAL HIGH (ref 70–99)
Potassium: 3.3 mEq/L — ABNORMAL LOW (ref 3.5–5.1)

## 2012-12-13 LAB — CBC
MCH: 32.4 pg (ref 26.0–34.0)
MCHC: 36.3 g/dL — ABNORMAL HIGH (ref 30.0–36.0)
MCV: 89.1 fL (ref 78.0–100.0)
Platelets: 409 10*3/uL — ABNORMAL HIGH (ref 150–400)
RBC: 2.38 MIL/uL — ABNORMAL LOW (ref 4.22–5.81)

## 2012-12-13 MED ORDER — SODIUM CHLORIDE 0.9 % IJ SOLN
10.0000 mL | INTRAMUSCULAR | Status: DC | PRN
  Administered 2012-12-13: 10 mL
  Administered 2012-12-15: 20 mL
  Administered 2012-12-15 – 2012-12-17 (×2): 10 mL

## 2012-12-13 MED ORDER — POTASSIUM CHLORIDE CRYS ER 20 MEQ PO TBCR
40.0000 meq | EXTENDED_RELEASE_TABLET | Freq: Once | ORAL | Status: AC
  Administered 2012-12-13: 40 meq via ORAL
  Filled 2012-12-13: qty 2

## 2012-12-13 MED ORDER — WARFARIN SODIUM 10 MG PO TABS
10.0000 mg | ORAL_TABLET | Freq: Once | ORAL | Status: DC
  Filled 2012-12-13: qty 1

## 2012-12-13 NOTE — Progress Notes (Addendum)
ANTICOAGULATION CONSULT NOTE - Follow-up Consult  Pharmacy Consult for Lovenox/warfarin  Indication: DVT in R IJ vein  Allergies  Allergen Reactions  . Morphine And Related Hives    Patient states can take Dilaudid with benadryl  . Adhesive (Tape)   . Latex     Patient Measurements: Height: 5\' 5"  (165.1 cm) Weight: 131 lb 1.6 oz (59.467 kg) IBW/kg (Calculated) : 61.5 Heparin Dosing Weight:   Vital Signs: Temp: 98.7 F (37.1 C) (07/03 0522) Temp src: Oral (07/03 0522) BP: 105/46 mmHg (07/03 0522) Pulse Rate: 69 (07/03 0522)  Labs:  Recent Labs  12/10/12 1914  12/10/12 2210 12/11/12 0715 12/12/12 0355 12/12/12 2310 12/13/12 0435  HGB 7.5*  --   --  7.1* 6.3* 8.0* 7.7*  HCT 19.8*  --   --  18.8* 16.7* 22.1* 21.2*  PLT 477*  --   --  431* 396  --  409*  APTT  --   --  44*  --   --   --   --   LABPROT  --   < > 15.1 15.0 15.7*  --  16.2*  INR  --   < > 1.22 1.21 1.28  --  1.33  HEPARINUNFRC  --   --   --  0.28*  --   --   --   CREATININE 0.73  --   --  0.61  --   --  0.60  < > = values in this interval not displayed.  Estimated Creatinine Clearance: 109.5 ml/min (by C-G formula based on Cr of 0.6).   Medical History: Past Medical History  Diagnosis Date  . Sickle cell anemia   . Asthma   . 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 Heart murmur     Medications:  Scheduled:  . deferoxamine (DESFERAL) IV  2,000 mg Intravenous QHS  . enoxaparin (LOVENOX) injection  60 mg Subcutaneous Q12H  . folic acid  1 mg Oral Daily  .  HYDROmorphone (DILAUDID) injection  2 mg Intravenous Q2H  . HYDROmorphone HCl  16 mg Oral Q24H  . hydroxyurea  500 mg Oral TID  . linezolid  600 mg Oral Q12H  . piperacillin-tazobactam (ZOSYN)  IV  3.375 g Intravenous Q8H  . sodium chloride  3 mL Intravenous Q12H  . Warfarin - Pharmacist Dosing Inpatient   Does not apply q1800   Infusions:  . sodium chloride 75 mL/hr at 12/12/12 2130    Assessment: 36 YOM with sickle-cell disease,  presents with acute pain crisis, including pain and swelling to R side of neck. CT revealed occlusive thrombus of R IJ proximal to port-a-cath.  He is on (low-dose) warfarin prior to admission for port-a-cath "maintenance."  He has been started on heparin gtt and resumed warfarin.  Home warfarin dose was 2mg  (again for catheter maintenance, not for TREATMENT)  INR today is 1.33 (unchanged since yesterday, possibly d/t PRBC infusions)  CBC: hgb = 7.7 s/p PRBC 7/2, plts WNL (on linezolid)  Heparin changed to lovenox 7/1  Goal of Therapy:  INR 2-3 Heparin level 0.3-0.7 units/ml Monitor platelets by anticoagulation protocol: Yes   Plan:   D#3 of minimum 5 day overlap for acute VTE  Coumadin 10mg  tonight as INR essentially unchanged last 24h  Continue lovenox 60mg  SQ q12h  Daily INR  Spoke with patient re: warfarin therapy, his main concern was inadequate pain mgmt, specifically pain on R side of neck with inability to turn head  ?? Who will monitor warfarin/INRs  as outpatient (d/w Dr. Brien Few, plan TBD based on hospital course)  Juliette Alcide, PharmD, BCPS.   Pager: [phone]  12/13/2012,7:17 AM

## 2012-12-13 NOTE — Progress Notes (Signed)
ANTICOAGULATION CONSULT NOTE - Follow-up Consult  Pharmacy Consult for Lovenox/warfarin  Indication: DVT in R IJ vein  Allergies  Allergen Reactions  . Morphine And Related Hives    Patient states can take Dilaudid with benadryl  . Adhesive (Tape)   . Latex     Patient Measurements: Height: 5\' 5"  (165.1 cm) Weight: 131 lb 1.6 oz (59.467 kg) IBW/kg (Calculated) : 61.5 Heparin Dosing Weight:   Vital Signs: Temp: 98.6 F (37 C) (07/03 1350) Temp src: Oral (07/03 1350) BP: 112/61 mmHg (07/03 1350) Pulse Rate: 83 (07/03 1350)  Labs:  Recent Labs  12/10/12 1914  12/10/12 2210 12/11/12 0715 12/12/12 0355 12/12/12 2310 12/13/12 0435  HGB 7.5*  --   --  7.1* 6.3* 8.0* 7.7*  HCT 19.8*  --   --  18.8* 16.7* 22.1* 21.2*  PLT 477*  --   --  431* 396  --  409*  APTT  --   --  44*  --   --   --   --   LABPROT  --   < > 15.1 15.0 15.7*  --  16.2*  INR  --   < > 1.22 1.21 1.28  --  1.33  HEPARINUNFRC  --   --   --  0.28*  --   --   --   CREATININE 0.73  --   --  0.61  --   --  0.60  < > = values in this interval not displayed.  Estimated Creatinine Clearance: 109.5 ml/min (by C-G formula based on Cr of 0.6).   Medical History: Past Medical History  Diagnosis Date  . Sickle cell anemia   . Asthma   . 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 Heart murmur     Medications:  Scheduled:  . deferoxamine (DESFERAL) IV  2,000 mg Intravenous QHS  . enoxaparin (LOVENOX) injection  60 mg Subcutaneous Q12H  . folic acid  1 mg Oral Daily  .  HYDROmorphone (DILAUDID) injection  2 mg Intravenous Q2H  . HYDROmorphone HCl  16 mg Oral Q24H  . hydroxyurea  500 mg Oral TID  . linezolid  600 mg Oral Q12H  . piperacillin-tazobactam (ZOSYN)  IV  3.375 g Intravenous Q8H  . potassium chloride  40 mEq Oral Once  . sodium chloride  3 mL Intravenous Q12H  . Warfarin - Pharmacist Dosing Inpatient   Does not apply q1800   Infusions:  . sodium chloride 75 mL/hr at 12/13/12 1032     Assessment: 36 YOM with sickle-cell disease, presents with acute pain crisis, including pain and swelling to R side of neck. CT revealed occlusive thrombus of R IJ proximal to port-a-cath.  He is on (low-dose) warfarin prior to admission for port-a-cath "maintenance."  He has been started on heparin gtt and resumed warfarin.  Home warfarin dose was 2mg  (again for catheter maintenance, not for TREATMENT)  INR today is 1.33 (unchanged since yesterday, possibly d/t PRBC infusions)  CBC: hgb = 7.7 s/p PRBC 7/2, plts WNL (on linezolid)  Heparin changed to lovenox 7/1  Goal of Therapy:  INR 2-3 Heparin level 0.3-0.7 units/ml Monitor platelets by anticoagulation protocol: Yes   Plan:   D#3 of minimum 5 day overlap for acute VTE  Coumadin 10mg  tonight as INR essentially unchanged last 24h  Continue lovenox 60mg  SQ q12h  Daily INR  Spoke with patient re: warfarin therapy, his main concern was inadequate pain mgmt, specifically pain on R side of neck with inability  to turn head  ?? Who will monitor warfarin/INRs as outpatient (d/w Dr. Brien Few, plan TBD based on hospital course)  Juliette Alcide, PharmD, BCPS.   Pager: [phone]  12/13/2012,2:30 PM  ADDENDUM:  Spoke with Dr. Brien Few after seeing his note re: VVS consult, will hold warfarin tonight pending VVS evaluation for possible removal of port-a-cath with thrombectomy.    Juliette Alcide, PharmD, BCPS.   Pager: [phone] 12/13/2012 2:31 PM

## 2012-12-13 NOTE — Consult Note (Signed)
[facility] [place]  Patient name: Christopher Warren MRN: [id number] DOB: 1975/12/10 Sex: male  REASON FOR CONSULT: DVT in Right IJ  HPI: Christopher Warren is a 37 y.o. male who was admitted on 12/11/12 with back pain and right neck pain. The patient has a history of sickle cell disease. He developed pain in his right neck approximately 4 days ago. He states that it hurts to turn his neck. He thinks that he has had a low-grade fever. Workup included a CT scan of the neck which showed evidence of a DVT in the right internal jugular vein. He denies any previous history of DVT.   He is currently on IV Zosyn, Lovenox, and Coumadin.  Past Medical History  Diagnosis Date  . Sickle cell anemia   . Asthma   . 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 Heart murmur    Family History  Problem Relation Age of Onset  . Sickle cell anemia Mother   . Sickle cell anemia Son    SOCIAL HISTORY: History  Substance Use Topics  . Smoking status: Current Every Day Smoker -- 1.00 packs/day for 5 years    Types: Cigarettes  . Smokeless tobacco: Never Used  . Alcohol Use: No   Allergies  Allergen Reactions  . Morphine And Related Hives    Patient states can take Dilaudid with benadryl  . Adhesive (Tape)   . Latex    Current Facility-Administered Medications  Medication Dose Route Frequency Provider Last Rate Last Dose  . 0.45 % sodium chloride infusion   Intravenous Continuous Kathlen Mody, MD 75 mL/hr at 12/13/12 1032    . acetaminophen (TYLENOL) tablet 650 mg  650 mg Oral Q6H PRN Laveda Norman, MD   650 mg at 12/12/12 0855  . albuterol (PROVENTIL HFA;VENTOLIN HFA) 108 (90 BASE) MCG/ACT inhaler 2 puff  2 puff Inhalation Q6H PRN Kathlen Mody, MD      . deferoxamine (DESFERAL) 2,000 mg in dextrose 5 % 500 mL infusion  2,000 mg Intravenous QHS Elease Etienne, MD 125 mL/hr at 12/13/12 0210 2,000 mg at 12/13/12 0210  . diphenhydrAMINE (BENADRYL) capsule 25 mg  25 mg Oral Q6H  PRN Kathlen Mody, MD   25 mg at 12/13/12 1454  . enoxaparin (LOVENOX) injection 60 mg  60 mg Subcutaneous Q12H Tad Moore [place], RPH   60 mg at 12/13/12 1842  . folic acid (FOLVITE) tablet 1 mg  1 mg Oral Daily Kathlen Mody, MD   1 mg at 12/13/12 1030  . HYDROmorphone (DILAUDID) injection 2 mg  2 mg Intravenous Q2H Laveda Norman, MD   2 mg at 12/13/12 1842  . HYDROmorphone (DILAUDID) tablet 8 mg  8 mg Oral Q6H PRN Kathlen Mody, MD   8 mg at 12/13/12 1616  . HYDROmorphone HCl (EXALGO) SR tablet 16 mg  16 mg Oral Q24H Kathlen Mody, MD   16 mg at 12/13/12 0641  . hydroxyurea (HYDREA) capsule 500 mg  500 mg Oral TID Kathlen Mody, MD   500 mg at 12/13/12 1648  . linezolid (ZYVOX) tablet 600 mg  600 mg Oral Q12H Randall Hiss, MD   600 mg at 12/13/12 1030  . ondansetron (ZOFRAN) tablet 4 mg  4 mg Oral Q6H PRN Kathlen Mody, MD       Or  . ondansetron (ZOFRAN) injection 4 mg  4 mg Intravenous Q6H PRN Kathlen Mody, MD   4 mg at 12/13/12 1454  . piperacillin-tazobactam (ZOSYN)  IVPB 3.375 g  3.375 g Intravenous [address] [place], RPH   3.375 g at 12/13/12 1239  . promethazine (PHENERGAN) tablet 12.5 mg  12.5 mg Oral QID PRN Kathlen Mody, MD   12.5 mg at 12/12/12 1440  . sodium chloride 0.9 % injection 10-40 mL  10-40 mL Intracatheter PRN Laveda Norman, MD   10 mL at 12/13/12 0434  . sodium chloride 0.9 % injection 3 mL  3 mL Intravenous Q12H Kathlen Mody, MD   3 mL at 12/12/12 2131  . temazepam (RESTORIL) capsule 15 mg  15 mg Oral QHS PRN Kathlen Mody, MD      . Warfarin - Pharmacist Dosing Inpatient   Does not apply q1800 Jacquenette Shone Crowford Darlina Guys., [facility]       REVIEW OF SYSTEMS: Arly.Keller ] denotes positive finding; [  ] denotes negative finding CARDIOVASCULAR:  [ ]  chest pain   [ ]  chest pressure   [ ]  palpitations   [ ]  orthopnea  [ ]  dyspnea on exertion   [ ]  claudication   [ ]  rest pain   [ ]  DVT  PULMONARY:   [ ]  productive cough   [ ]  asthma   [ ]  wheezing NEUROLOGIC:   [ ]  weakness  [ ]   paresthesias  [ ]  aphasia  [ ]  amaurosis  [ ]  dizziness HEMATOLOGIC:   [ ]  bleeding problems   [ ]  clotting disorders MUSCULOSKELETAL:  [ ]  joint pain   [ ]  joint swelling [ ]  leg swelling GASTROINTESTINAL: [ ]   blood in stool  [ ]   hematemesis GENITOURINARY:  [ ]   dysuria  [ ]   hematuria PSYCHIATRIC:  [ ]  history of major depression INTEGUMENTARY:  [ ]  rashes  [ ]  ulcers CONSTITUTIONAL:  [ ]  fever   [ ]  chills  PHYSICAL EXAM: Filed Vitals:   12/12/12 2055 12/12/12 2215 12/13/12 0522 12/13/12 1350  BP: 118/55 117/65 105/46 112/61  Pulse: 90 86 69 83  Temp: 99.6 F (37.6 C) 99.4 F (37.4 C) 98.7 F (37.1 C) 98.6 F (37 C)  TempSrc: Oral Oral Oral Oral  Resp: 16 16 16 18   Height:      Weight:      SpO2:   97% 94%   Body mass index is 21.82 kg/(m^2). GENERAL: The patient is a well-nourished male, in no acute distress. The vital signs are documented above. CARDIOVASCULAR: There is a regular rate and rhythm.  NECK: tenderness to palpation on the right side of his neck. Mild swelling. His Port-A-Cath which was placed in [place] appears to enter the internal jugular vein above the clavicle. PULMONARY: There is good air exchange bilaterally without wheezing or rales. ABDOMEN: Soft and non-tender with normal pitched bowel sounds.  MUSCULOSKELETAL: There are no major deformities or cyanosis. NEUROLOGIC: No focal weakness or paresthesias are detected. SKIN: There are no ulcers or rashes noted. PSYCHIATRIC: The patient has a normal affect.  DATA:  Lab Results  Component Value Date   WBC 15.9* 12/13/2012   HGB 7.7* 12/13/2012   HCT 21.2* 12/13/2012   MCV 89.1 12/13/2012   PLT 409* 12/13/2012   Lab Results  Component Value Date   NA 136 12/13/2012   K 3.3* 12/13/2012   CL 100 12/13/2012   CO2 29 12/13/2012   Lab Results  Component Value Date   CREATININE 0.60 12/13/2012   Lab Results  Component Value Date   INR 1.33 12/13/2012   INR 1.28 12/12/2012   INR  1.21 12/11/2012   PROTIME 15.6*  03/29/2012   CT SCAN NECK: Thrombus in R IJ.  MEDICAL ISSUES: DVT RIGHT IJ: I agree with plans for Lovenox until his Coumadin is therapeutic. I would recommend 3 months of Coumadin. There is no indication for surgical intervention. I would favor leaving the Port-A-Cath unless it became infected. I do not think there would be much to gain by removing the catheter given that the catheter itself is fairly small and therefore will only create a small channel that might potentially help. Even that he uses the catheter frequently I think it would be best to leave this. If it were removed, he would probably have to have a new one placed on the left side with the risk associated with that. He happy to see him back in 3 months with a follow up ultrasound of his neck that can be arranged by his primary care physician. We will be available as needed.  Jodine Muchmore S [facility] [place] Beeper: [phone]

## 2012-12-13 NOTE — Progress Notes (Signed)
[facility] PROGRESS NOTE  PERSHING SKIDMORE WUJ:[id number] DOB: 03/05/1976 DOA: 12/10/2012 PCP: No primary provider on file.  Assessment/Plan: Active Problems:   Leukocytosis   Fever   Sickle cell crisis   Internal jugular (IJ) vein thromboembolism, acute   Septic thrombophlebitis   Screen for STD (sexually transmitted disease)   Thrombocytopathia    1. Right internal jugular DVT +/- septic thrombophlebitis: Patient presented with right-sided neck pain and CT scan revealed occlusive thrombus within the right internal jugular vein, with associated inflammatory stranding in the lower right neck, proximal to the insertion of the right-sided Port-A-Cath. Managed initially with iv Heparin infusion, as well as Coumadin per pharmacy (he had been on low dose Coumadin PTA). Switched to Lovenox bridge on 12/11/12. Started on Vancomycin/Zosyn, but Dr Daiva Eves provided ID consultation, and has recommended Zyvox in place of Vancomycin. Blood cultures and throat strept test are negative so far. Patient is afebrile, and wcc is trending down steadily. Have requested VVS consult today, for possible Port-a-Cath removal/thrombectomy.  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 Managing with hypotonic IV fluids and pain management. 3. Sickle cell anemia: Baseline hemoglobin appears to be 7.5-8.0. HB is 6.3 today. For transfusion of 2 units PRBC on 12/12/12, wit bump in HB o 8.0. Following CBC. . 4. Iron overload. Patient has a history of iron overload, and pre-admission, was on Exjade therapy. Managing with Desferrioxamine. Will restart Exjade on discharge.  5. Leukocytosis: Appears chronic? Baseline 15-20. But appears exacerbated by current infective process. Patient already on antibiotics. Following CBC, improving.  Code Status: Full Code.  Family Communication:   Disposition Plan: To be determined.    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 In the ED, temperature 100.76F WBC 36K, hemoglobin 7.5, platelets 477, reticulocytes 412 and CT of the neck showed occlusive thrombus within the right internal jugular vein with associated inflammatory stranding in the lower right neck proximal to the insertion of the right sided Port-A-Cath. Hospitalist admission was requested. [facility] apparently [facility] from [facility]ologists due to hostile behavior. He currently does not have a PCP or hematologist.   Consultants:  Dr Daiva Eves, ID.  Procedures:  CT neck.  CXR.   Antibiotics:  Zyvox 12/11/12>>>  Zosyn 12/10/12>>>  HPI/Subjective: C/O right -sided ache in neck, as well as leg pains.   Objective: Vital signs in last 24 hours: Temp:  [97.4 F (36.3 C)-99.6 F (37.6 C)] 98.7 F (37.1 C) (07/03 0522) Pulse Rate:  [69-101] 69 (07/03 0522) Resp:  [12-18] 16 (07/03 0522) BP: (102-118)/(40-65) 105/46 mmHg (07/03 0522) SpO2:  [93 %-97 %] 97 % (07/03 0522) Weight change:  Last BM Date: 12/12/12  Intake/Output from previous day: 07/02 0701 - 07/03 0700 In: 3902.5 [P.O.:960; I.V.:1655; Blood:687.5; IV Piggyback:600] Out: -      Physical Exam: General: Comfortable, alert, communicative, fully oriented, not short of breath at rest.  HEENT:  Moderate clinical pallor, mildly jaundiced, no conjunctival injection or discharge. NECK:  Supple, JVP not seen, no carotid bruits, no palpable lymphadenopathy, no palpable goiter. Has palpable cordlike and tender right IJ vein.  CHEST:  Clinically clear to auscultation, no wheezes,  no crackles. HEART:  Sounds 1 and 2 heard, normal, regular, no  murmurs. ABDOMEN:  Full, soft, non-tender, no palpable organomegaly, no palpable masses, normal bowel sounds. GENITALIA:  Not examined. LOWER EXTREMITIES:  No pitting edema, palpable peripheral pulses. MUSCULOSKELETAL SYSTEM:  Unremarkable. CENTRAL NERVOUS SYSTEM:  No focal neurologic deficit on gross examination.  Lab Results:  Recent Labs  12/12/12 0355 12/12/12 2310 12/13/12 0435  WBC 27.2*  --  15.9*  HGB 6.3* 8.0* 7.7*  HCT 16.7* 22.1* 21.2*  PLT 396  --  409*    Recent Labs  12/11/12 0715 12/13/12 0435  NA 137 136  K 3.6 3.3*  CL 102 100  CO2 27 29  GLUCOSE 92 130*  BUN 8 7  CREATININE 0.61 0.60  CALCIUM 8.8 8.8   Recent Results (from the past 240 hour(s))  CULTURE, BLOOD (ROUTINE X 2)     Status: None   Collection Time    12/10/12  7:14 PM      Result Value Range Status   Specimen Description BLOOD   Final   Special Requests BOTTLES DRAWN AEROBIC AND ANAEROBIC   Final   Culture  Setup Time 12/11/2012 01:12   Final   Culture     Final   Value:        BLOOD CULTURE RECEIVED NO GROWTH TO DATE CULTURE WILL BE HELD FOR 5 DAYS BEFORE ISSUING A FINAL NEGATIVE REPORT   Report Status PENDING   Incomplete  RAPID STREP SCREEN     Status: None   Collection Time    12/10/12  8:36 PM      Result Value Range Status   Streptococcus, Group A Screen (Direct) NEGATIVE  NEGATIVE Final   Comment: (NOTE)     A Rapid Antigen test may result negative if the antigen level in the     sample is below the detection level of this test. The FDA has not     cleared this test as a stand-alone test therefore the rapid antigen     negative result has reflexed to a Group A Strep culture.  CULTURE, GROUP A STREP     Status: None   Collection Time    12/10/12  8:36 PM      Result Value Range Status   Specimen Description THROAT   Final   Special Requests NONE   Final   Culture No Beta Hemolytic Streptococci Isolated   Final   Report Status 12/12/2012 FINAL   Final  CULTURE, BLOOD  (ROUTINE X 2)     Status: None   Collection Time    12/10/12  8:48 PM      Result Value Range Status   Specimen Description BLOOD RIGHT AC   Final   Special Requests BOTTLES DRAWN AEROBIC AND ANAEROBIC 3 CC   Final   Culture  Setup Time 12/11/2012 01:12   Final   Culture     Final   Value:        BLOOD CULTURE RECEIVED NO GROWTH TO DATE CULTURE WILL BE HELD FOR 5 DAYS BEFORE ISSUING A FINAL NEGATIVE REPORT   Report Status PENDING   Incomplete     Studies/Results: No results found.  Medications: Scheduled Meds: . deferoxamine (DESFERAL) IV  2,000 mg Intravenous QHS  . enoxaparin (LOVENOX) injection  60 mg Subcutaneous Q12H  . folic acid  1 mg Oral Daily  .  HYDROmorphone (DILAUDID) injection  2 mg Intravenous Q2H  . HYDROmorphone HCl  16 mg  Oral Q24H  . hydroxyurea  500 mg Oral TID  . linezolid  600 mg Oral Q12H  . piperacillin-tazobactam (ZOSYN)  IV  3.375 g Intravenous Q8H  . potassium chloride  40 mEq Oral Once  . sodium chloride  3 mL Intravenous Q12H  . warfarin  10 mg Oral ONCE-1800  . Warfarin - Pharmacist Dosing Inpatient   Does not apply q1800   Continuous Infusions: . sodium chloride 75 mL/hr at 12/13/12 1032   PRN Meds:.acetaminophen, albuterol, diphenhydrAMINE, HYDROmorphone, ondansetron (ZOFRAN) IV, ondansetron, promethazine, sodium chloride, temazepam    LOS: 3 days   Michae Grimley,CHRISTOPHER  [facility] Pager [phone]. If 8PM-8AM, please contact night-coverage at [email or web address], password [facility] 12/13/2012, 12:41 PM  LOS: 3 days

## 2012-12-13 NOTE — Progress Notes (Signed)
[facility]    Subjective: Feels neck pain is worse and difficulty turning head even worse than yesterday   Antibiotics:  Anti-infectives   Start     Dose/Rate Route Frequency Ordered Stop   12/12/12 2000  piperacillin-tazobactam (ZOSYN) IVPB 3.375 g     3.375 g 12.5 mL/hr over 240 Minutes Intravenous Every 8 hours 12/12/12 1107     12/12/12 1130  piperacillin-tazobactam (ZOSYN) IVPB 3.375 g     3.375 g 100 mL/hr over 30 Minutes Intravenous  Once 12/12/12 1107 12/12/12 1139   12/11/12 1300  linezolid (ZYVOX) tablet 600 mg     600 mg Oral Every 12 hours 12/11/12 1135     12/11/12 0100  piperacillin-tazobactam (ZOSYN) IVPB 3.375 g  Status:  Discontinued     3.375 g 12.5 mL/hr over 240 Minutes Intravenous 3 times per day 12/11/12 0050 12/12/12 1107   12/11/12 0000  vancomycin (VANCOCIN) IVPB 750 mg/150 ml premix  Status:  Discontinued     750 mg 150 mL/hr over 60 Minutes Intravenous Every 8 hours 12/10/12 2351 12/11/12 1135      Medications: Scheduled Meds: . deferoxamine (DESFERAL) IV  2,000 mg Intravenous QHS  . enoxaparin (LOVENOX) injection  60 mg Subcutaneous Q12H  . folic acid  1 mg Oral Daily  .  HYDROmorphone (DILAUDID) injection  2 mg Intravenous Q2H  . HYDROmorphone HCl  16 mg Oral Q24H  . hydroxyurea  500 mg Oral TID  . linezolid  600 mg Oral Q12H  . piperacillin-tazobactam (ZOSYN)  IV  3.375 g Intravenous Q8H  . sodium chloride  3 mL Intravenous Q12H  . Warfarin - Pharmacist Dosing Inpatient   Does not apply q1800   Continuous Infusions: . sodium chloride 75 mL/hr at 12/13/12 1032   PRN Meds:.acetaminophen, albuterol, diphenhydrAMINE, HYDROmorphone, ondansetron (ZOFRAN) IV, ondansetron, promethazine, sodium chloride, temazepam   Objective: Weight change:   Intake/Output Summary (Last 24 hours) at 12/13/12 1740 Last data filed at 12/13/12 1500  Gross per 24 hour  Intake 3497.5 ml  Output      0 ml  Net 3497.5 ml   Blood  pressure 112/61, pulse 83, temperature 98.6 F (37 C), temperature source Oral, resp. rate 18, height 5\' 5"  (1.651 m), weight 131 lb 1.6 oz (59.467 kg), SpO2 94.00%. Temp:  [97.4 F (36.3 C)-99.6 F (37.6 C)] 98.6 F (37 C) (07/03 1350) Pulse Rate:  [69-90] 83 (07/03 1350) Resp:  [14-18] 18 (07/03 1350) BP: (102-118)/(43-65) 112/61 mmHg (07/03 1350) SpO2:  [94 %-97 %] 94 % (07/03 1350)  Physical Exam: HEENT: pupils reactive to light and accommodation, EOMI, oropharynx clear and without exudate, neck is swollen and tender to palpation  CVS regular rate, normal r, II/VI systolic murmur  Chest: diminished bs at left base but otherwise clear  Abdomen: soft nondistended, normal bowel sounds,  Extremities: no clubbing or edema noted bilaterally  Skin: tunnelled catheter not overltly swollen or hot  Neuro: nonfocal, strength and sensation intact   Lab Results:  Recent Labs  12/12/12 0355 12/12/12 2310 12/13/12 0435  WBC 27.2*  --  15.9*  HGB 6.3* 8.0* 7.7*  HCT 16.7* 22.1* 21.2*  PLT 396  --  409*    BMET  Recent Labs  12/11/12 0715 12/13/12 0435  NA 137 136  K 3.6 3.3*  CL 102 100  CO2 27 29  GLUCOSE 92 130*  BUN 8 7  CREATININE 0.61 0.60  CALCIUM 8.8 8.8  Micro Results: Recent Results (from the past 240 hour(s))  CULTURE, BLOOD (ROUTINE X 2)     Status: None   Collection Time    12/10/12  7:14 PM      Result Value Range Status   Specimen Description BLOOD   Final   Special Requests BOTTLES DRAWN AEROBIC AND ANAEROBIC   Final   Culture  Setup Time 12/11/2012 01:12   Final   Culture     Final   Value:        BLOOD CULTURE RECEIVED NO GROWTH TO DATE CULTURE WILL BE HELD FOR 5 DAYS BEFORE ISSUING A FINAL NEGATIVE REPORT   Report Status PENDING   Incomplete  RAPID STREP SCREEN     Status: None   Collection Time    12/10/12  8:36 PM      Result Value Range Status   Streptococcus, Group A Screen (Direct) NEGATIVE  NEGATIVE Final   Comment: (NOTE)     A Rapid  Antigen test may result negative if the antigen level in the     sample is below the detection level of this test. The FDA has not     cleared this test as a stand-alone test therefore the rapid antigen     negative result has reflexed to a Group A Strep culture.  CULTURE, GROUP A STREP     Status: None   Collection Time    12/10/12  8:36 PM      Result Value Range Status   Specimen Description THROAT   Final   Special Requests NONE   Final   Culture No Beta Hemolytic Streptococci Isolated   Final   Report Status 12/12/2012 FINAL   Final  CULTURE, BLOOD (ROUTINE X 2)     Status: None   Collection Time    12/10/12  8:48 PM      Result Value Range Status   Specimen Description BLOOD RIGHT AC   Final   Special Requests BOTTLES DRAWN AEROBIC AND ANAEROBIC 3 CC   Final   Culture  Setup Time 12/11/2012 01:12   Final   Culture     Final   Value:        BLOOD CULTURE RECEIVED NO GROWTH TO DATE CULTURE WILL BE HELD FOR 5 DAYS BEFORE ISSUING A FINAL NEGATIVE REPORT   Report Status PENDING   Incomplete    Studies/Results: No results found.    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, chronic porta-cath admitted with neck pain subjective chills found to have occlusive thrombus associated with portacath currently on heparin, zosyn and vancomycin (though vanco and heparin not compatible via same line)   #1 IJ thrombus +/-septic thrombophlebitis:   NOT CLEARLY a case of septic thrombophlebitis but reasonable to cover for this pending cultures and further investigation   --continue zosyn , linezolid  --Certainly IF this is case of septic thrombophlebitis then porta-cath MUST come out (and would consult [facility] re this) and additionally he would need thrombectomy by [facility]   --Given that the clot formed around a porta-cath I would think that portacth needs to come out regardless to remove nidus for clot, and if there is still equivocation for infected clot  examinationn and culture of area and porta cath itself could be informative.  If catheter NOT removed is the plan to keep him on protracted to lifelong anticoagulation because of a catheter???  Does he really need this for exhange transfusions, or could he  go without it?    #2 Screening: HIV negative      LOS: 3 days   Acey Lav 12/13/2012, 5:40 PM

## 2012-12-13 NOTE — Progress Notes (Signed)
ANTIBIOTIC CONSULT NOTE - follow-up  Pharmacy Consult for Zosyn  Indication:  Fever, leukocytosis, : possibly infectious source from the thrombophlebitis   Allergies  Allergen Reactions  . Morphine And Related Hives    Patient states can take Dilaudid with benadryl  . Adhesive (Tape)   . Latex     Patient Measurements: Height: 5\' 5"  (165.1 cm) Weight: 131 lb 1.6 oz (59.467 kg) IBW/kg (Calculated) : 61.5 Adjusted Body Weight:   Vital Signs: Temp: 98.7 F (37.1 C) (07/03 0522) Temp src: Oral (07/03 0522) BP: 105/46 mmHg (07/03 0522) Pulse Rate: 69 (07/03 0522) Intake/Output from previous day: 07/02 0701 - 07/03 0700 In: 3902.5 [P.O.:960; I.V.:1655; Blood:687.5; IV Piggyback:600] Out: -  Intake/Output from this shift:    Labs:  Recent Labs  12/10/12 1914 12/11/12 0715 12/12/12 0355 12/12/12 2310 12/13/12 0435  WBC 36.0* 26.3* 27.2*  --  15.9*  HGB 7.5* 7.1* 6.3* 8.0* 7.7*  PLT 477* 431* 396  --  409*  CREATININE 0.73 0.61  --   --  0.60   Estimated Creatinine Clearance: 109.5 ml/min (by C-G formula based on Cr of 0.6). No results found for this basename: VANCOTROUGH, Leodis Binet, VANCORANDOM, GENTTROUGH, GENTPEAK, GENTRANDOM, TOBRATROUGH, TOBRAPEAK, TOBRARND, AMIKACINPEAK, AMIKACINTROU, AMIKACIN,  in the last 72 hours   Microbiology: Recent Results (from the past 720 hour(s))  CULTURE, BLOOD (ROUTINE X 2)     Status: None   Collection Time    12/10/12  7:14 PM      Result Value Range Status   Specimen Description BLOOD   Final   Special Requests BOTTLES DRAWN AEROBIC AND ANAEROBIC   Final   Culture  Setup Time 12/11/2012 01:12   Final   Culture     Final   Value:        BLOOD CULTURE RECEIVED NO GROWTH TO DATE CULTURE WILL BE HELD FOR 5 DAYS BEFORE ISSUING A FINAL NEGATIVE REPORT   Report Status PENDING   Incomplete  RAPID STREP SCREEN     Status: None   Collection Time    12/10/12  8:36 PM      Result Value Range Status   Streptococcus, Group A Screen  (Direct) NEGATIVE  NEGATIVE Final   Comment: (NOTE)     A Rapid Antigen test may result negative if the antigen level in the     sample is below the detection level of this test. The FDA has not     cleared this test as a stand-alone test therefore the rapid antigen     negative result has reflexed to a Group A Strep culture.  CULTURE, GROUP A STREP     Status: None   Collection Time    12/10/12  8:36 PM      Result Value Range Status   Specimen Description THROAT   Final   Special Requests NONE   Final   Culture No Beta Hemolytic Streptococci Isolated   Final   Report Status 12/12/2012 FINAL   Final  CULTURE, BLOOD (ROUTINE X 2)     Status: None   Collection Time    12/10/12  8:48 PM      Result Value Range Status   Specimen Description BLOOD RIGHT [facility]   Final   Special Requests BOTTLES DRAWN AEROBIC AND ANAEROBIC 3 CC   Final   Culture  Setup Time 12/11/2012 01:12   Final   Culture     Final   Value:        BLOOD  CULTURE RECEIVED NO GROWTH TO DATE CULTURE WILL BE HELD FOR 5 DAYS BEFORE ISSUING A FINAL NEGATIVE REPORT   Report Status PENDING   Incomplete    Medical History: Past Medical History  Diagnosis Date  . Sickle cell anemia   . Asthma   . 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 Heart murmur     Medications:  Anti-infectives   Start     Dose/Rate Route Frequency Ordered Stop   12/12/12 2000  piperacillin-tazobactam (ZOSYN) IVPB 3.375 g     3.375 g 12.5 mL/hr over 240 Minutes Intravenous Every 8 hours 12/12/12 1107     12/12/12 1130  piperacillin-tazobactam (ZOSYN) IVPB 3.375 g     3.375 g 100 mL/hr over 30 Minutes Intravenous  Once 12/12/12 1107 12/12/12 1139   12/11/12 1300  linezolid (ZYVOX) tablet 600 mg     600 mg Oral Every 12 hours 12/11/12 1135     12/11/12 0100  piperacillin-tazobactam (ZOSYN) IVPB 3.375 g  Status:  Discontinued     3.375 g 12.5 mL/hr over 240 Minutes Intravenous 3 times per day 12/11/12 0050 12/12/12 1107   12/11/12 0000  vancomycin  (VANCOCIN) IVPB 750 mg/150 ml premix  Status:  Discontinued     750 mg 150 mL/hr over 60 Minutes Intravenous Every 8 hours 12/10/12 2351 12/11/12 1135     Assessment: 36 YOM with fever, leukocytosis, possibly infectious source from the thrombophlebitis.  ID seeing patient.  Vancomycin changed to linezolid PO 7/1 as patient was on heparin gtt and vancomycin was incompatible (port only access site).  Decision re: port-a-cath removal to be determined.   HPI: fever and leukocytosis/empiric for thrombophlebitis  7/1 >> vanc >> 7/1 7/1 >> zosyn >>  7/1 >> Linezolid >>   Tmax: 99.6 WBCs: 15.9 (improved) Renal: SCr = 0.60 for CrCL >183ml/min Platelets: 409  6/30 blood: NGTD 6/30 Grp A strep: negative 6/30 Rapid strep: negative  Goal of Therapy:   Zosyn based on renal function   Plan:   Continue Zosyn 3.375g IV Q8H infused over 4hrs.  Continue linezolid 600mg  PO BID (monitoring platelets)  Juliette Alcide, PharmD, BCPS.   Pager: [phone]  12/13/2012,7:25 AM

## 2012-12-14 ENCOUNTER — Inpatient Hospital Stay (HOSPITAL_COMMUNITY): Payer: Medicaid Other

## 2012-12-14 LAB — BASIC METABOLIC PANEL
BUN: 6 mg/dL (ref 6–23)
Creatinine, Ser: 0.56 mg/dL (ref 0.50–1.35)
GFR calc non Af Amer: >90 mL/min (ref >90)
Glucose, Bld: 100 mg/dL — ABNORMAL HIGH (ref 70–99)
Potassium: 4.2 mEq/L (ref 3.5–5.1)

## 2012-12-14 LAB — CBC
MCH: 32.1 pg (ref 26.0–34.0)
MCHC: 35.7 g/dL (ref 30.0–36.0)
MCV: 89.8 fL (ref 78.0–100.0)
Platelets: 338 10*3/uL (ref 150–400)
RBC: 2.46 MIL/uL — ABNORMAL LOW (ref 4.22–5.81)

## 2012-12-14 LAB — PROTIME-INR: Prothrombin Time: 16.3 seconds — ABNORMAL HIGH (ref 11.6–15.2)

## 2012-12-14 MED ORDER — IOHEXOL 300 MG/ML  SOLN
100.0000 mL | Freq: Once | INTRAMUSCULAR | Status: AC | PRN
  Administered 2012-12-14: 100 mL via INTRAVENOUS

## 2012-12-14 MED ORDER — WARFARIN SODIUM 10 MG PO TABS
10.0000 mg | ORAL_TABLET | Freq: Once | ORAL | Status: AC
  Administered 2012-12-14: 10 mg via ORAL
  Filled 2012-12-14: qty 1

## 2012-12-14 MED ORDER — HYDROMORPHONE HCL PF 2 MG/ML IJ SOLN
3.0000 mg | INTRAMUSCULAR | Status: DC
  Administered 2012-12-14 – 2012-12-16 (×24): 3 mg via INTRAVENOUS
  Filled 2012-12-14 (×24): qty 2

## 2012-12-14 NOTE — Progress Notes (Signed)
[facility] PROGRESS NOTE  Christopher Warren ZOX:[id number] DOB: 09-Dec-1975 DOA: 12/10/2012 PCP: No primary provider on file.  Assessment/Plan: Active Problems:   Leukocytosis   Fever   Sickle cell crisis   Internal jugular (IJ) vein thromboembolism, acute   Septic thrombophlebitis   Screen for STD (sexually transmitted disease)   Thrombocytopathia    1. Right internal jugular DVT +/- septic thrombophlebitis: Patient presented with right-sided neck pain and CT scan revealed occlusive thrombus within the right internal jugular vein, with associated inflammatory stranding in the lower right neck, proximal to the insertion of the right-sided Port-A-Cath. Managed initially with iv Heparin infusion, as well as Coumadin per pharmacy (he had been on low dose Coumadin PTA). Switched to Lovenox bridge on 12/11/12. Started on Vancomycin/Zosyn, but Dr Daiva Eves provided ID consultation, and has recommended Zyvox in place of Vancomycin. Blood cultures and throat strept test are negative so far. Patient is afebrile, and wcc is trending down steadily. Dr Waverly Ferrari provided vascular surgical consult on 12/13/12, and has advised against Port-A-Cath removal. For repeat neck CT scan today, otherwise, managing per ID.  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 Managing with hypotonic IV fluids and pain management. 3. Sickle cell anemia: Baseline hemoglobin appears to be 7.5-8.0. HB is 6.3 today. For transfusion of 2 units PRBC on 12/12/12, wit bump in HB o 8.0. Following CBC. HB is stable. 4. Iron overload. Patient has a history of iron overload, and pre-admission, was on Exjade therapy. Managing with Desferrioxamine. Will restart Exjade on discharge.  5. Leukocytosis: Appears chronic? Baseline 15-20. But appears exacerbated by current infective process. Patient already  on antibiotics. Following CBC, improving.  Code Status: Full Code.  Family Communication:  Disposition Plan: To be determined.    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 In the ED, temperature 100.21F WBC 36K, hemoglobin 7.5, platelets 477, reticulocytes 412 and CT of the neck showed occlusive thrombus within the right internal jugular vein with associated inflammatory stranding in the lower right neck proximal to the insertion of the right sided Port-A-Cath. Hospitalist admission was requested. [facility] apparently [facility]ologists due to hostile behavior. He currently does not have a PCP or hematologist.   Consultants:  Dr Daiva Eves, ID.  Procedures:  CT neck.  CXR.   Antibiotics:  Zyvox 12/11/12>>>  Zosyn 12/10/12>>>  HPI/Subjective: Pain is improving, but has a "bad night" yesterday.   Objective: Vital signs in last 24 hours: Temp:  [98.4 F (36.9 C)-98.6 F (37 C)] 98.4 F (36.9 C) (07/04 0600) Pulse Rate:  [66-83] 66 (07/04 0600) Resp:  [18] 18 (07/04 0600) BP: (107-115)/(40-61) 115/47 mmHg (07/04 0600) SpO2:  [94 %-98 %] 97 % (07/04 0600) Weight change:  Last BM Date: 12/12/12  Intake/Output from previous day: 07/03 0701 - 07/04 0700 In: 2553.8 [P.O.:720; I.V.:1783.8; IV Piggyback:50] Out: -      Physical Exam: General: Comfortable, alert, communicative, fully oriented, not short of breath at rest.  HEENT:  Moderate clinical pallor, mildly jaundiced, no conjunctival injection or discharge. NECK:  Supple, JVP not seen, no carotid bruits, no palpable lymphadenopathy, no palpable goiter. Has palpable  cordlike and tender right IJ vein.  CHEST:  Clinically clear to auscultation, no  wheezes, no crackles. HEART:  Sounds 1 and 2 heard, normal, regular, no murmurs. ABDOMEN:  Full, soft, non-tender, no palpable organomegaly, no palpable masses, normal bowel sounds. GENITALIA:  Not examined. LOWER EXTREMITIES:  No pitting edema, palpable peripheral pulses. MUSCULOSKELETAL SYSTEM:  Unremarkable. CENTRAL NERVOUS SYSTEM:  No focal neurologic deficit on gross examination.  Lab Results:  Recent Labs  12/13/12 0435 12/14/12 0540  WBC 15.9* 12.5*  HGB 7.7* 7.9*  HCT 21.2* 22.1*  PLT 409* 338    Recent Labs  12/13/12 0435 12/14/12 0540  NA 136 134*  K 3.3* 4.2  CL 100 101  CO2 29 27  GLUCOSE 130* 100*  BUN 7 6  CREATININE 0.60 0.56  CALCIUM 8.8 8.9   Recent Results (from the past 240 hour(s))  CULTURE, BLOOD (ROUTINE X 2)     Status: None   Collection Time    12/10/12  7:14 PM      Result Value Range Status   Specimen Description BLOOD   Final   Special Requests BOTTLES DRAWN AEROBIC AND ANAEROBIC   Final   Culture  Setup Time 12/11/2012 01:12   Final   Culture     Final   Value:        BLOOD CULTURE RECEIVED NO GROWTH TO DATE CULTURE WILL BE HELD FOR 5 DAYS BEFORE ISSUING A FINAL NEGATIVE REPORT   Report Status PENDING   Incomplete  RAPID STREP SCREEN     Status: None   Collection Time    12/10/12  8:36 PM      Result Value Range Status   Streptococcus, Group A Screen (Direct) NEGATIVE  NEGATIVE Final   Comment: (NOTE)     A Rapid Antigen test may result negative if the antigen level in the     sample is below the detection level of this test. The FDA has not     cleared this test as a stand-alone test therefore the rapid antigen     negative result has reflexed to a Group A Strep culture.  CULTURE, GROUP A STREP     Status: None   Collection Time    12/10/12  8:36 PM      Result Value Range Status   Specimen Description THROAT   Final   Special Requests NONE   Final   Culture No Beta Hemolytic Streptococci Isolated   Final   Report Status  12/12/2012 FINAL   Final  CULTURE, BLOOD (ROUTINE X 2)     Status: None   Collection Time    12/10/12  8:48 PM      Result Value Range Status   Specimen Description BLOOD RIGHT AC   Final   Special Requests BOTTLES DRAWN AEROBIC AND ANAEROBIC 3 CC   Final   Culture  Setup Time 12/11/2012 01:12   Final   Culture     Final   Value:        BLOOD CULTURE RECEIVED NO GROWTH TO DATE CULTURE WILL BE HELD FOR 5 DAYS BEFORE ISSUING A FINAL NEGATIVE REPORT   Report Status PENDING   Incomplete     Studies/Results: No results found.  Medications: Scheduled Meds: . deferoxamine (DESFERAL) IV  2,000 mg Intravenous QHS  . enoxaparin (LOVENOX) injection  60 mg Subcutaneous Q12H  . folic acid  1 mg Oral Daily  .  HYDROmorphone (DILAUDID) injection  2 mg Intravenous Q2H  .  HYDROmorphone HCl  16 mg Oral Q24H  . hydroxyurea  500 mg Oral TID  . linezolid  600 mg Oral Q12H  . piperacillin-tazobactam (ZOSYN)  IV  3.375 g Intravenous Q8H  . sodium chloride  3 mL Intravenous Q12H  . warfarin  10 mg Oral ONCE-1800  . Warfarin - Pharmacist Dosing Inpatient   Does not apply q1800   Continuous Infusions: . sodium chloride 75 mL/hr at 12/14/12 0647   PRN Meds:.acetaminophen, albuterol, diphenhydrAMINE, HYDROmorphone, ondansetron (ZOFRAN) IV, ondansetron, promethazine, sodium chloride, temazepam    LOS: 4 days   Susanna Benge,CHRISTOPHER  [facility] Pager [phone]. If 8PM-8AM, please contact night-coverage at [email or web address], password [facility] 12/14/2012, 12:52 PM  LOS: 4 days

## 2012-12-14 NOTE — Progress Notes (Signed)
ANTICOAGULATION CONSULT NOTE - Follow-up Consult  Pharmacy Consult for Lovenox/warfarin  Indication: DVT in R IJ vein  Allergies  Allergen Reactions  . Morphine And Related Hives    Patient states can take Dilaudid with benadryl  . Adhesive (Tape)   . Latex     Patient Measurements: Height: 5\' 5"  (165.1 cm) Weight: 131 lb 1.6 oz (59.467 kg) IBW/kg (Calculated) : 61.5 Heparin Dosing Weight:   Vital Signs: Temp: 98.4 F (36.9 C) (07/04 0600) Temp src: Oral (07/04 0600) BP: 115/47 mmHg (07/04 0600) Pulse Rate: 66 (07/04 0600)  Labs:  Recent Labs  12/12/12 0355 12/12/12 2310 12/13/12 0435 12/14/12 0540  HGB 6.3* 8.0* 7.7* 7.9*  HCT 16.7* 22.1* 21.2* 22.1*  PLT 396  --  409* 338  LABPROT 15.7*  --  16.2* 16.3*  INR 1.28  --  1.33 1.34  CREATININE  --   --  0.60 0.56    Estimated Creatinine Clearance: 109.5 ml/min (by C-G formula based on Cr of 0.56).   Medical History: Past Medical History  Diagnosis Date  . Sickle cell anemia   . Asthma   . 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 Heart murmur     Medications:  Scheduled:  . deferoxamine (DESFERAL) IV  2,000 mg Intravenous QHS  . enoxaparin (LOVENOX) injection  60 mg Subcutaneous Q12H  . folic acid  1 mg Oral Daily  .  HYDROmorphone (DILAUDID) injection  2 mg Intravenous Q2H  . HYDROmorphone HCl  16 mg Oral Q24H  . hydroxyurea  500 mg Oral TID  . linezolid  600 mg Oral Q12H  . piperacillin-tazobactam (ZOSYN)  IV  3.375 g Intravenous Q8H  . sodium chloride  3 mL Intravenous Q12H  . Warfarin - Pharmacist Dosing Inpatient   Does not apply q1800   Infusions:  . sodium chloride 75 mL/hr at 12/14/12 0647    Assessment: 36 YOM with sickle-cell disease, presents with acute pain crisis, including pain and swelling to R side of neck. CT revealed occlusive thrombus of R IJ proximal to port-a-cath.  He is on (low-dose) warfarin prior to admission for port-a-cath "maintenance."  He has been started on heparin gtt  and resumed warfarin.  Heparin changed to Lovenox 7/1.  Home warfarin dose was 2mg  (again for catheter maintenance, not for TREATMENT)  INR today is 1.34.  Warfarin held yesterday pending [facility] evaluation for possible removal of port-a-cath with thrombectomy.  VVS recommends leaving the port-a-cath so will resume warfarin today.  CBC: hgb = 7.9 s/p PRBC 7/2, plts WNL (on linezolid)  Goal of Therapy:  INR 2-3 Monitor platelets by anticoagulation protocol: Yes   Plan:   Coumadin 10mg  tonight since INR had not been responding to 7.5 mg doses.  Warfarin dose held last night, so will count this as D#3 of minimum 5 day overlap for acute VTE.  Continue lovenox 60mg  SQ q12h  Daily INR  Clance Boll, PharmD, BCPS Pager: [phone] 12/14/2012 7:48 AM

## 2012-12-14 NOTE — Progress Notes (Signed)
[facility]    Subjective: Feels neck pain is worse to the same  Antibiotics:  Anti-infectives   Start     Dose/Rate Route Frequency Ordered Stop   12/12/12 2000  piperacillin-tazobactam (ZOSYN) IVPB 3.375 g     3.375 g 12.5 mL/hr over 240 Minutes Intravenous Every 8 hours 12/12/12 1107     12/12/12 1130  piperacillin-tazobactam (ZOSYN) IVPB 3.375 g     3.375 g 100 mL/hr over 30 Minutes Intravenous  Once 12/12/12 1107 12/12/12 1139   12/11/12 1300  linezolid (ZYVOX) tablet 600 mg     600 mg Oral Every 12 hours 12/11/12 1135     12/11/12 0100  piperacillin-tazobactam (ZOSYN) IVPB 3.375 g  Status:  Discontinued     3.375 g 12.5 mL/hr over 240 Minutes Intravenous 3 times per day 12/11/12 0050 12/12/12 1107   12/11/12 0000  vancomycin (VANCOCIN) IVPB 750 mg/150 ml premix  Status:  Discontinued     750 mg 150 mL/hr over 60 Minutes Intravenous Every 8 hours 12/10/12 2351 12/11/12 1135      Medications: Scheduled Meds: . deferoxamine (DESFERAL) IV  2,000 mg Intravenous QHS  . enoxaparin (LOVENOX) injection  60 mg Subcutaneous Q12H  . folic acid  1 mg Oral Daily  .  HYDROmorphone (DILAUDID) injection  2 mg Intravenous Q2H  . HYDROmorphone HCl  16 mg Oral Q24H  . hydroxyurea  500 mg Oral TID  . linezolid  600 mg Oral Q12H  . piperacillin-tazobactam (ZOSYN)  IV  3.375 g Intravenous Q8H  . sodium chloride  3 mL Intravenous Q12H  . warfarin  10 mg Oral ONCE-1800  . Warfarin - Pharmacist Dosing Inpatient   Does not apply q1800   Continuous Infusions: . sodium chloride 75 mL/hr at 12/14/12 0647   PRN Meds:.acetaminophen, albuterol, diphenhydrAMINE, HYDROmorphone, ondansetron (ZOFRAN) IV, ondansetron, promethazine, sodium chloride, temazepam   Objective: Weight change:   Intake/Output Summary (Last 24 hours) at 12/14/12 1106 Last data filed at 12/14/12 0647  Gross per 24 hour  Intake 2313.75 ml  Output      0 ml  Net 2313.75 ml   Blood pressure  115/47, pulse 66, temperature 98.4 F (36.9 C), temperature source Oral, resp. rate 18, height 5\' 5"  (1.651 m), weight 131 lb 1.6 oz (59.467 kg), SpO2 97.00%. Temp:  [98.4 F (36.9 C)-98.6 F (37 C)] 98.4 F (36.9 C) (07/04 0600) Pulse Rate:  [66-83] 66 (07/04 0600) Resp:  [18] 18 (07/04 0600) BP: (107-115)/(40-61) 115/47 mmHg (07/04 0600) SpO2:  [94 %-98 %] 97 % (07/04 0600)  Physical Exam: HEENT: pupils reactive to light and accommodation, EOMI, oropharynx clear and without exudate, neck is swollen and tender to palpation  CVS regular rate, normal r, II/VI systolic murmur  Chest: diminished bs at left base but otherwise clear  Abdomen: soft nondistended, normal bowel sounds,  Extremities: no clubbing or edema noted bilaterally  Skin: tunnelled catheter not overltly swollen or hot  Neuro: nonfocal, strength and sensation intact   Lab Results:  Recent Labs  12/13/12 0435 12/14/12 0540  WBC 15.9* 12.5*  HGB 7.7* 7.9*  HCT 21.2* 22.1*  PLT 409* 338    BMET  Recent Labs  12/13/12 0435 12/14/12 0540  NA 136 134*  K 3.3* 4.2  CL 100 101  CO2 29 27  GLUCOSE 130* 100*  BUN 7 6  CREATININE 0.60 0.56  CALCIUM 8.8 8.9    Micro Results: Recent Results (from the past 240  hour(s))  CULTURE, BLOOD (ROUTINE X 2)     Status: None   Collection Time    12/10/12  7:14 PM      Result Value Range Status   Specimen Description BLOOD   Final   Special Requests BOTTLES DRAWN AEROBIC AND ANAEROBIC   Final   Culture  Setup Time 12/11/2012 01:12   Final   Culture     Final   Value:        BLOOD CULTURE RECEIVED NO GROWTH TO DATE CULTURE WILL BE HELD FOR 5 DAYS BEFORE ISSUING A FINAL NEGATIVE REPORT   Report Status PENDING   Incomplete  RAPID STREP SCREEN     Status: None   Collection Time    12/10/12  8:36 PM      Result Value Range Status   Streptococcus, Group A Screen (Direct) NEGATIVE  NEGATIVE Final   Comment: (NOTE)     A Rapid Antigen test may result negative if the  antigen level in the     sample is below the detection level of this test. The FDA has not     cleared this test as a stand-alone test therefore the rapid antigen     negative result has reflexed to a Group A Strep culture.  CULTURE, GROUP A STREP     Status: None   Collection Time    12/10/12  8:36 PM      Result Value Range Status   Specimen Description THROAT   Final   Special Requests NONE   Final   Culture No Beta Hemolytic Streptococci Isolated   Final   Report Status 12/12/2012 FINAL   Final  CULTURE, BLOOD (ROUTINE X 2)     Status: None   Collection Time    12/10/12  8:48 PM      Result Value Range Status   Specimen Description BLOOD RIGHT AC   Final   Special Requests BOTTLES DRAWN AEROBIC AND ANAEROBIC 3 CC   Final   Culture  Setup Time 12/11/2012 01:12   Final   Culture     Final   Value:        BLOOD CULTURE RECEIVED NO GROWTH TO DATE CULTURE WILL BE HELD FOR 5 DAYS BEFORE ISSUING A FINAL NEGATIVE REPORT   Report Status PENDING   Incomplete    Studies/Results: No results found.    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, chronic porta-cath admitted with neck pain subjective chills found to have occlusive thrombus associated with portacath currently on heparin, zosyn and vancomycin (though vanco and heparin not compatible via same line)   #1 IJ thrombus +/-septic thrombophlebitis:   NOT CLEARLY a case of septic thrombophlebitis but reasonable to cover for this pending cultures and further investigation   --continue zosyn , linezolid for now ---Greatly appreciate [facility] seeing pt and they would prefer to leave catheter in place --I will repeat CT of the neck to ensure it is not worsening radiographically and if reassuring will de-escalate antibiotics in absence of positive blood cultures          LOS: 4 days   Paulette Blanch Dam 12/14/2012, 11:06 AM

## 2012-12-15 LAB — CBC WITH DIFFERENTIAL/PLATELET
Eosinophils Relative: 4 % (ref 0–5)
HCT: 25.4 % — ABNORMAL LOW (ref 39.0–52.0)
Lymphocytes Relative: 25 % (ref 12–46)
Lymphs Abs: 3.7 10*3/uL (ref 0.7–4.0)
MCV: 89.1 fL (ref 78.0–100.0)
Monocytes Absolute: 1.1 10*3/uL — ABNORMAL HIGH (ref 0.1–1.0)
Monocytes Relative: 8 % (ref 3–12)
RBC: 2.85 MIL/uL — ABNORMAL LOW (ref 4.22–5.81)
WBC: 14.8 10*3/uL — ABNORMAL HIGH (ref 4.0–10.5)

## 2012-12-15 LAB — BASIC METABOLIC PANEL
CO2: 28 mEq/L (ref 19–32)
Chloride: 98 mEq/L (ref 96–112)
Glucose, Bld: 105 mg/dL — ABNORMAL HIGH (ref 70–99)
Sodium: 132 mEq/L — ABNORMAL LOW (ref 135–145)

## 2012-12-15 LAB — CBC
HCT: 22.8 % — ABNORMAL LOW (ref 39.0–52.0)
Hemoglobin: 8.3 g/dL — ABNORMAL LOW (ref 13.0–17.0)
MCH: 32.7 pg (ref 26.0–34.0)
MCHC: 36.4 g/dL — ABNORMAL HIGH (ref 30.0–36.0)
RDW: 19.1 % — ABNORMAL HIGH (ref 11.5–15.5)

## 2012-12-15 LAB — RETICULOCYTES: Retic Count, Absolute: 284.5 10*3/uL — ABNORMAL HIGH (ref 19.0–186.0)

## 2012-12-15 LAB — PROTIME-INR: Prothrombin Time: 16.1 seconds — ABNORMAL HIGH (ref 11.6–15.2)

## 2012-12-15 MED ORDER — WARFARIN SODIUM 10 MG PO TABS
10.0000 mg | ORAL_TABLET | Freq: Once | ORAL | Status: AC
  Administered 2012-12-15: 10 mg via ORAL
  Filled 2012-12-15: qty 1

## 2012-12-15 NOTE — Progress Notes (Signed)
[facility] PROGRESS NOTE  Christopher Warren OZH:[id number] DOB: September 10, 1975 DOA: 12/10/2012 PCP: No primary provider on file.  Assessment/Plan: Active Problems:   Leukocytosis   Fever   Sickle cell crisis   Internal jugular (IJ) vein thromboembolism, acute   Septic thrombophlebitis   Screen for STD (sexually transmitted disease)   Thrombocytopathia    1. Right internal jugular DVT +/- septic thrombophlebitis: Patient presented with right-sided neck pain and CT scan revealed occlusive thrombus within the right internal jugular vein, with associated inflammatory stranding in the lower right neck, proximal to the insertion of the right-sided Port-A-Cath. Managed initially with iv Heparin infusion, as well as Coumadin per pharmacy (he had been on low dose Coumadin PTA). Switched to Lovenox bridge on 12/11/12. Started on Vancomycin/Zosyn, but Dr Daiva Eves provided ID consultation, and has recommended Zyvox in place of Vancomycin (Now day #6 antibiotics). Blood cultures and throat strept test are negative so far. Patient is afebrile, and wcc is trending down steadily. Dr Waverly Ferrari provided vascular surgical consult on 12/13/12, and has advised against Port-A-Cath removal. Repeat neck CT scan of 12/14/12, showed interval retraction of the clot in the right internal jugular vein, with blood flow around the clot, as well as decreased inflammation/edema around the area of the thrombus. Managing per ID.  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 Managing with hypotonic IV fluids and pain management, with improvement. 3. Sickle cell anemia: Baseline hemoglobin appears to be 7.5-8.0. HB is 6.3 today. For transfusion of 2 units PRBC on 12/12/12, with bump in HB to 8.0. Following CBC. HB is stable. 4. Iron overload. Patient has a  history of iron overload, and pre-admission, was on Exjade therapy. Managing with Desferrioxamine. Will restart Exjade on discharge.  5. Leukocytosis: Appears chronic? Baseline 15-20. But appears exacerbated by current infective process. Patient already on antibiotics. Following CBC, improving.  Code Status: Full Code.  Family Communication:  Disposition Plan: To be determined.    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 In the ED, temperature 100.37F WBC 36K, hemoglobin 7.5, platelets 477, reticulocytes 412 and CT of the neck showed occlusive thrombus within the right internal jugular vein with associated inflammatory stranding in the lower right neck proximal to the insertion of the right sided Port-A-Cath. Hospitalist admission was requested. [facility] apparently [facility] due to hostile behavior. He currently does not have a PCP or hematologist.   Consultants:  Dr Daiva Eves, ID.  Procedures:  CT neck.  CXR.   Antibiotics:  Zyvox 12/11/12>>>  Zosyn 12/10/12>>>  HPI/Subjective: Pain is improved.   Objective: Vital signs in last 24 hours: Temp:  [98.3 F (36.8 C)-98.7 F (37.1 C)] 98.3 F (36.8 C) (07/05 0529) Pulse Rate:  [61-67] 61 (07/05 0529) Resp:  [16-18] 16 (07/05 0529) BP: (100-104)/(39-58) 103/43 mmHg (07/05 0529) SpO2:  [93 %-97 %] 93 % (07/05 0529) Weight change:  Last BM Date: 12/12/12  Intake/Output from previous day: 07/04 0701 - 07/05 0700 In: 2796.3 [P.O.:480; I.V.:1816.3; IV Piggyback:500] Out: -      Physical Exam: General: Comfortable, alert, communicative, fully oriented, not short of  breath at rest.  HEENT:  Moderate clinical pallor, mildly jaundiced, no  conjunctival injection or discharge. NECK:  Supple, JVP not seen, no carotid bruits, no palpable lymphadenopathy, no palpable goiter. Palpable cordlike and tender right IJ vein has improved.  CHEST:  Clinically clear to auscultation, no wheezes, no crackles. HEART:  Sounds 1 and 2 heard, normal, regular, no murmurs. ABDOMEN:  Full, soft, non-tender, no palpable organomegaly, no palpable masses, normal bowel sounds. GENITALIA:  Not examined. LOWER EXTREMITIES:  No pitting edema, palpable peripheral pulses. MUSCULOSKELETAL SYSTEM:  Unremarkable. CENTRAL NERVOUS SYSTEM:  No focal neurologic deficit on gross examination.  Lab Results:  Recent Labs  12/14/12 0540 12/15/12 0610  WBC 12.5* 11.6*  HGB 7.9* 8.3*  HCT 22.1* 22.8*  PLT 338 335    Recent Labs  12/14/12 0540 12/15/12 0610  NA 134* 132*  K 4.2 3.9  CL 101 98  CO2 27 28  GLUCOSE 100* 105*  BUN 6 6  CREATININE 0.56 0.53  CALCIUM 8.9 9.1   Recent Results (from the past 240 hour(s))  CULTURE, BLOOD (ROUTINE X 2)     Status: None   Collection Time    12/10/12  7:14 PM      Result Value Range Status   Specimen Description BLOOD   Final   Special Requests BOTTLES DRAWN AEROBIC AND ANAEROBIC   Final   Culture  Setup Time 12/11/2012 01:12   Final   Culture     Final   Value:        BLOOD CULTURE RECEIVED NO GROWTH TO DATE CULTURE WILL BE HELD FOR 5 DAYS BEFORE ISSUING A FINAL NEGATIVE REPORT   Report Status PENDING   Incomplete  RAPID STREP SCREEN     Status: None   Collection Time    12/10/12  8:36 PM      Result Value Range Status   Streptococcus, Group A Screen (Direct) NEGATIVE  NEGATIVE Final   Comment: (NOTE)     A Rapid Antigen test may result negative if the antigen level in the     sample is below the detection level of this test. The FDA has not     cleared this test as a stand-alone test therefore the rapid antigen     negative result has reflexed to a Group A Strep culture.  CULTURE, GROUP A STREP      Status: None   Collection Time    12/10/12  8:36 PM      Result Value Range Status   Specimen Description THROAT   Final   Special Requests NONE   Final   Culture No Beta Hemolytic Streptococci Isolated   Final   Report Status 12/12/2012 FINAL   Final  CULTURE, BLOOD (ROUTINE X 2)     Status: None   Collection Time    12/10/12  8:48 PM      Result Value Range Status   Specimen Description BLOOD RIGHT AC   Final   Special Requests BOTTLES DRAWN AEROBIC AND ANAEROBIC 3 CC   Final   Culture  Setup Time 12/11/2012 01:12   Final   Culture     Final   Value:        BLOOD CULTURE RECEIVED NO GROWTH TO DATE CULTURE WILL BE HELD FOR 5 DAYS BEFORE ISSUING A FINAL NEGATIVE REPORT   Report Status PENDING   Incomplete     Studies/Results: Ct Soft Tissue Neck W Contrast  12/14/2012   *RADIOLOGY REPORT*  Clinical  Data: Neck swelling.  Thrombus in the right internal jugular vein.  CT NECK WITH CONTRAST  Technique:  Multidetector CT imaging of the neck was performed with intravenous contrast.  Contrast: OMNIPAQUE IOHEXOL 300 MG/ML  SOLN  Comparison: CT scan dated 12/10/2012  Findings: The thrombus in the right internal jugular vein has retracted slightly and there is now slight flow of blood around the thrombus through the internal jugular vein.  The thrombus extends from the level of insertion of the Port-A-Cath up into the neck over an 8 cm length.  There is decreased edema/inflammation in the adjacent soft tissues.  No visible abscess.  The thyroid gland is normal.  No significant adenopathy.  Lung apices are clear.  IMPRESSION:  1.  Interval retraction of the clot in the right internal jugular vein.  There is now blood flow around the clot. 2.  Decreased inflammation/edema around the area of the thrombus.   Original Report Authenticated By: Francene Boyers, M.D.    Medications: Scheduled Meds: . deferoxamine (DESFERAL) IV  2,000 mg Intravenous QHS  . enoxaparin (LOVENOX) injection  60 mg  Subcutaneous Q12H  . folic acid  1 mg Oral Daily  .  HYDROmorphone (DILAUDID) injection  3 mg Intravenous Q2H  . HYDROmorphone HCl  16 mg Oral Q24H  . hydroxyurea  500 mg Oral TID  . linezolid  600 mg Oral Q12H  . sodium chloride  3 mL Intravenous Q12H  . warfarin  10 mg Oral ONCE-1800  . Warfarin - Pharmacist Dosing Inpatient   Does not apply q1800   Continuous Infusions: . sodium chloride 75 mL/hr at 12/14/12 2029   PRN Meds:.acetaminophen, albuterol, diphenhydrAMINE, HYDROmorphone, ondansetron (ZOFRAN) IV, ondansetron, promethazine, sodium chloride, temazepam    LOS: 5 days   Yer Olivencia,CHRISTOPHER  [facility] Pager [phone]. If 8PM-8AM, please contact night-coverage at [email or web address], password [facility] 12/15/2012, 10:42 AM  LOS: 5 days

## 2012-12-15 NOTE — Progress Notes (Signed)
Pt instructed earlier this shift to be cautious of pulling IV line tight.  Was up in room at sink with tubing pulled very tight, repositioned pole and tubing.  Earl Many RN requested assist with PAC due to unable to flush.  Retaped and flushed well.  Reinstructed pt to be careful and do not let IV lines pull  Tight to prevent damage to PAC.

## 2012-12-15 NOTE — Progress Notes (Signed)
ANTICOAGULATION CONSULT NOTE - Follow-up Consult  Pharmacy Consult for Lovenox/warfarin  Indication: DVT in R IJ vein  Allergies  Allergen Reactions  . Morphine And Related Hives    Patient states can take Dilaudid with benadryl  . Adhesive (Tape)   . Latex     Patient Measurements: Height: 5\' 5"  (165.1 cm) Weight: 131 lb 1.6 oz (59.467 kg) IBW/kg (Calculated) : 61.5 Heparin Dosing Weight:   Vital Signs: Temp: 98.3 F (36.8 C) (07/05 0529) Temp src: Oral (07/05 0529) BP: 103/43 mmHg (07/05 0529) Pulse Rate: 61 (07/05 0529)  Labs:  Recent Labs  12/13/12 0435 12/14/12 0540 12/15/12 0610  HGB 7.7* 7.9* 8.3*  HCT 21.2* 22.1* 22.8*  PLT 409* 338 335  LABPROT 16.2* 16.3* 16.1*  INR 1.33 1.34 1.32  CREATININE 0.60 0.56 0.53    Estimated Creatinine Clearance: 109.5 ml/min (by C-G formula based on Cr of 0.53).   Medical History: Past Medical History  Diagnosis Date  . Sickle cell anemia   . Asthma   . 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 Heart murmur     Medications:  Scheduled:  . deferoxamine (DESFERAL) IV  2,000 mg Intravenous QHS  . enoxaparin (LOVENOX) injection  60 mg Subcutaneous Q12H  . folic acid  1 mg Oral Daily  .  HYDROmorphone (DILAUDID) injection  3 mg Intravenous Q2H  . HYDROmorphone HCl  16 mg Oral Q24H  . hydroxyurea  500 mg Oral TID  . linezolid  600 mg Oral Q12H  . sodium chloride  3 mL Intravenous Q12H  . Warfarin - Pharmacist Dosing Inpatient   Does not apply q1800   Infusions:  . sodium chloride 75 mL/hr at 12/14/12 2029    Assessment: 36 YOM with sickle-cell disease, presents with acute pain crisis, including pain and swelling to R side of neck. CT revealed occlusive thrombus of R IJ proximal to port-a-cath.  He is on (low-dose) warfarin prior to admission for port-a-cath "maintenance."  He has been started on heparin gtt and resumed warfarin.  Heparin changed to Lovenox 7/1.  Home warfarin dose was 2mg  (again for catheter  maintenance, not for TREATMENT)  INR today is 1.32.  Warfarin held 7/3 pending [facility] evaluation for possible removal of port-a-cath with thrombectomy.  [facility] recommends leaving the port-a-cath so will resumed warfarin yesterday.  CBC: hgb = 8.3 s/p PRBC 7/2, plts WNL (on linezolid)  Goal of Therapy:  INR 2-3 Monitor platelets by anticoagulation protocol: Yes   Plan:   Repeating Coumadin 10mg  tonight since INR has not been responding to 7.5 mg doses.   Continue lovenox 60mg  SQ q12h.  Goal is for 5 consecutive days of warfarin/Lovenox overlap and INR>2 for 24 hours for treatment of acute VTE.  Daily INR.  Clance Boll, PharmD, BCPS Pager: [phone] 12/15/2012 8:02 AM

## 2012-12-15 NOTE — Progress Notes (Signed)
[facility]    Subjective: Feels neck pain is about the same  Antibiotics:  Anti-infectives   Start     Dose/Rate Route Frequency Ordered Stop   12/12/12 2000  piperacillin-tazobactam (ZOSYN) IVPB 3.375 g  Status:  Discontinued     3.375 g 12.5 mL/hr over 240 Minutes Intravenous Every 8 hours 12/12/12 1107 12/14/12 1528   12/12/12 1130  piperacillin-tazobactam (ZOSYN) IVPB 3.375 g     3.375 g 100 mL/hr over 30 Minutes Intravenous  Once 12/12/12 1107 12/12/12 1139   12/11/12 1300  linezolid (ZYVOX) tablet 600 mg  Status:  Discontinued     600 mg Oral Every 12 hours 12/11/12 1135 12/15/12 1258   12/11/12 0100  piperacillin-tazobactam (ZOSYN) IVPB 3.375 g  Status:  Discontinued     3.375 g 12.5 mL/hr over 240 Minutes Intravenous 3 times per day 12/11/12 0050 12/12/12 1107   12/11/12 0000  vancomycin (VANCOCIN) IVPB 750 mg/150 ml premix  Status:  Discontinued     750 mg 150 mL/hr over 60 Minutes Intravenous Every 8 hours 12/10/12 2351 12/11/12 1135      Medications: Scheduled Meds: . deferoxamine (DESFERAL) IV  2,000 mg Intravenous QHS  . enoxaparin (LOVENOX) injection  60 mg Subcutaneous Q12H  . folic acid  1 mg Oral Daily  .  HYDROmorphone (DILAUDID) injection  3 mg Intravenous Q2H  . HYDROmorphone HCl  16 mg Oral Q24H  . hydroxyurea  500 mg Oral TID  . sodium chloride  3 mL Intravenous Q12H  . warfarin  10 mg Oral ONCE-1800  . Warfarin - Pharmacist Dosing Inpatient   Does not apply q1800   Continuous Infusions: . sodium chloride 75 mL/hr at 12/14/12 2029   PRN Meds:.acetaminophen, albuterol, diphenhydrAMINE, HYDROmorphone, ondansetron (ZOFRAN) IV, ondansetron, promethazine, sodium chloride, temazepam   Objective: Weight change:   Intake/Output Summary (Last 24 hours) at 12/15/12 1259 Last data filed at 12/15/12 1124  Gross per 24 hour  Intake 2566.25 ml  Output      0 ml  Net 2566.25 ml   Blood pressure 103/43, pulse 61, temperature  98.3 F (36.8 C), temperature source Oral, resp. rate 16, height 5\' 5"  (1.651 m), weight 131 lb 1.6 oz (59.467 kg), SpO2 93.00%. Temp:  [98.3 F (36.8 C)-98.7 F (37.1 C)] 98.3 F (36.8 C) (07/05 0529) Pulse Rate:  [61-67] 61 (07/05 0529) Resp:  [16-18] 16 (07/05 0529) BP: (100-104)/(39-58) 103/43 mmHg (07/05 0529) SpO2:  [93 %-97 %] 93 % (07/05 0529)  Physical Exam: HEENT: pupils reactive to light and accommodation, EOMI, oropharynx clear and without exudate, neck is swollen and tender to palpation  CVS regular rate, normal r, II/VI systolic murmur  Chest: diminished bs at left base but otherwise clear  Abdomen: soft nondistended, normal bowel sounds,  Extremities: no clubbing or edema noted bilaterally  Skin: tunnelled catheter not overltly swollen or hot  Neuro: nonfocal, strength and sensation intact   Lab Results:  Recent Labs  12/15/12 0610 12/15/12 1120  WBC 11.6* 14.8*  HGB 8.3* 9.1*  HCT 22.8* 25.4*  PLT 335 536*    BMET  Recent Labs  12/14/12 0540 12/15/12 0610  NA 134* 132*  K 4.2 3.9  CL 101 98  CO2 27 28  GLUCOSE 100* 105*  BUN 6 6  CREATININE 0.56 0.53  CALCIUM 8.9 9.1    Micro Results: Recent Results (from the past 240 hour(s))  CULTURE, BLOOD (ROUTINE X 2)     Status:  None   Collection Time    12/10/12  7:14 PM      Result Value Range Status   Specimen Description BLOOD   Final   Special Requests BOTTLES DRAWN AEROBIC AND ANAEROBIC   Final   Culture  Setup Time 12/11/2012 01:12   Final   Culture     Final   Value:        BLOOD CULTURE RECEIVED NO GROWTH TO DATE CULTURE WILL BE HELD FOR 5 DAYS BEFORE ISSUING A FINAL NEGATIVE REPORT   Report Status PENDING   Incomplete  RAPID STREP SCREEN     Status: None   Collection Time    12/10/12  8:36 PM      Result Value Range Status   Streptococcus, Group A Screen (Direct) NEGATIVE  NEGATIVE Final   Comment: (NOTE)     A Rapid Antigen test may result negative if the antigen level in the      sample is below the detection level of this test. The FDA has not     cleared this test as a stand-alone test therefore the rapid antigen     negative result has reflexed to a Group A Strep culture.  CULTURE, GROUP A STREP     Status: None   Collection Time    12/10/12  8:36 PM      Result Value Range Status   Specimen Description THROAT   Final   Special Requests NONE   Final   Culture No Beta Hemolytic Streptococci Isolated   Final   Report Status 12/12/2012 FINAL   Final  CULTURE, BLOOD (ROUTINE X 2)     Status: None   Collection Time    12/10/12  8:48 PM      Result Value Range Status   Specimen Description BLOOD RIGHT AC   Final   Special Requests BOTTLES DRAWN AEROBIC AND ANAEROBIC 3 CC   Final   Culture  Setup Time 12/11/2012 01:12   Final   Culture     Final   Value:        BLOOD CULTURE RECEIVED NO GROWTH TO DATE CULTURE WILL BE HELD FOR 5 DAYS BEFORE ISSUING A FINAL NEGATIVE REPORT   Report Status PENDING   Incomplete    Studies/Results: Ct Soft Tissue Neck W Contrast  12/14/2012   *RADIOLOGY REPORT*  Clinical Data: Neck swelling.  Thrombus in the right internal jugular vein.  CT NECK WITH CONTRAST  Technique:  Multidetector CT imaging of the neck was performed with intravenous contrast.  Contrast: OMNIPAQUE IOHEXOL 300 MG/ML  SOLN  Comparison: CT scan dated 12/10/2012  Findings: The thrombus in the right internal jugular vein has retracted slightly and there is now slight flow of blood around the thrombus through the internal jugular vein.  The thrombus extends from the level of insertion of the Port-A-Cath up into the neck over an 8 cm length.  There is decreased edema/inflammation in the adjacent soft tissues.  No visible abscess.  The thyroid gland is normal.  No significant adenopathy.  Lung apices are clear.  IMPRESSION:  1.  Interval retraction of the clot in the right internal jugular vein.  There is now blood flow around the clot. 2.  Decreased inflammation/edema  around the area of the thrombus.   Original Report Authenticated By: Francene Boyers, M.D.      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 He is afebrile and doing fine with narrowing to zyvox  #1 IJ thrombus +/-septic thrombophlebitis:   NOT CLEARLY a case of septic thrombophlebitis and have narrowed to zyvox yesterday.  CT scan shows clot burden diminished  I will dc zyvox and observe off abx    LOS: 5 days   Acey Lav 12/15/2012, 12:59 PM

## 2012-12-16 DIAGNOSIS — R509 Fever, unspecified: Secondary | ICD-10-CM

## 2012-12-16 LAB — BASIC METABOLIC PANEL
CO2: 31 mEq/L (ref 19–32)
GFR calc non Af Amer: >90 mL/min (ref >90)
Glucose, Bld: 108 mg/dL — ABNORMAL HIGH (ref 70–99)
Potassium: 4.3 mEq/L (ref 3.5–5.1)
Sodium: 135 mEq/L (ref 135–145)

## 2012-12-16 LAB — RETICULOCYTES: Retic Ct Pct: 8.5 % — ABNORMAL HIGH (ref 0.4–3.1)

## 2012-12-16 LAB — CBC
HCT: 24.2 % — ABNORMAL LOW (ref 39.0–52.0)
MCH: 32.3 pg (ref 26.0–34.0)
MCHC: 35.5 g/dL (ref 30.0–36.0)
MCV: 91 fL (ref 78.0–100.0)
RDW: 18.8 % — ABNORMAL HIGH (ref 11.5–15.5)

## 2012-12-16 MED ORDER — HYDROMORPHONE HCL PF 2 MG/ML IJ SOLN
3.0000 mg | INTRAMUSCULAR | Status: DC | PRN
  Administered 2012-12-16 (×2): 3 mg via INTRAVENOUS
  Filled 2012-12-16 (×2): qty 2

## 2012-12-16 MED ORDER — HYDROMORPHONE HCL PF 2 MG/ML IJ SOLN
3.0000 mg | INTRAMUSCULAR | Status: DC
  Administered 2012-12-16 – 2012-12-17 (×11): 3 mg via INTRAVENOUS
  Filled 2012-12-16 (×11): qty 2

## 2012-12-16 MED ORDER — WARFARIN SODIUM 2 MG PO TABS
2.0000 mg | ORAL_TABLET | Freq: Once | ORAL | Status: AC
  Administered 2012-12-16: 2 mg via ORAL
  Filled 2012-12-16: qty 1

## 2012-12-16 NOTE — Progress Notes (Addendum)
ANTICOAGULATION CONSULT NOTE - Follow-up Consult  Pharmacy Consult for Lovenox/warfarin  Indication: DVT in R IJ vein  Allergies  Allergen Reactions  . Morphine And Related Hives    Patient states can take Dilaudid with benadryl  . Adhesive (Tape)   . Latex     Patient Measurements: Height: 5\' 5"  (165.1 cm) Weight: 131 lb 1.6 oz (59.467 kg) IBW/kg (Calculated) : 61.5 Heparin Dosing Weight:   Vital Signs: Temp: 98.2 F (36.8 C) (07/06 0611) Temp src: Oral (07/06 0611) BP: 114/40 mmHg (07/06 0611) Pulse Rate: 114 (07/06 0611)  Labs:  Recent Labs  12/14/12 0540 12/15/12 0610 12/15/12 1120 12/16/12 0347  HGB 7.9* 8.3* 9.1* 8.6*  HCT 22.1* 22.8* 25.4* 24.2*  PLT 338 335 536* 626*  LABPROT 16.3* 16.1*  --  22.3*  INR 1.34 1.32  --  2.03*  CREATININE 0.56 0.53  --  0.57    Estimated Creatinine Clearance: 109.5 ml/min (by C-G formula based on Cr of 0.57).   Medical History: Past Medical History  Diagnosis Date  . Sickle cell anemia   . Asthma   . 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 Heart murmur     Medications:  Scheduled:  . deferoxamine (DESFERAL) IV  2,000 mg Intravenous QHS  . enoxaparin (LOVENOX) injection  60 mg Subcutaneous Q12H  . folic acid  1 mg Oral Daily  .  HYDROmorphone (DILAUDID) injection  3 mg Intravenous Q2H  . HYDROmorphone HCl  16 mg Oral Q24H  . hydroxyurea  500 mg Oral TID  . sodium chloride  3 mL Intravenous Q12H  . Warfarin - Pharmacist Dosing Inpatient   Does not apply q1800   Infusions:  . sodium chloride 75 mL/hr at 12/15/12 1418    Assessment: 36 YOM with sickle-cell disease, presents with acute pain crisis, including pain and swelling to R side of neck. CT revealed occlusive thrombus of R IJ proximal to port-a-cath.  He is on (low-dose) warfarin prior to admission for port-a-cath "maintenance."  He had been started on heparin gtt and resumed warfarin.  Heparin changed to Lovenox 7/1.  Home warfarin dose was 2mg  (again for  catheter maintenance, not for TREATMENT)  Warfarin held 7/3 pending VVS evaluation for possible removal of port-a-cath with thrombectomy.  VVS recommends leaving the port-a-cath so resumed warfarin 7/4.  INR today is 2.03.  Have been providing patient with 10 mg doses since INR had not been responding to lower doses during this admission thus far.  Due to the significant increase in INR today, will provide a low dose tonight.  CBC: hgb = 8.6 s/p PRBC 7/2, plts 626K (on linezolid) No bleeding/complications reported.  Goal of Therapy:  INR 2-3 Monitor platelets by anticoagulation protocol: Yes   Plan:   Coumadin 2 mg PO once today.  Continue lovenox 60mg  SQ q12h.  Day #5/5 of warfarin/Lovenox overlap with goal of INR>2 for at least 24 hours for treatment of acute VTE.  Will discontinue Lovenox tomorrow if INR remains therapeutic.  Daily INR.  Clance Boll, PharmD, BCPS Pager: [phone] 12/16/2012 7:17 AM

## 2012-12-16 NOTE — Discharge Summary (Signed)
Physician Discharge Summary  SERGE MAIN ZOX:[id number] DOB: [birth year] DOA: 12/10/2012  PCP: No primary provider on file.  Admit date: 12/10/2012 Discharge date: 12/17/2012  Time spent: 40 minutes  Recommendations for Outpatient Follow-up:  1. Follow up with PMD.   2. For INR check on 12/19/12.   Discharge Diagnoses:  Active Problems:   Leukocytosis   Fever   Sickle cell crisis   Internal jugular (IJ) vein thromboembolism, acute   Septic thrombophlebitis   Screen for STD (sexually transmitted disease)   Thrombocytopathia   Discharge Condition: Satisfactory.   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 In the ED, temperature 100.30F WBC 36K, hemoglobin 7.5, platelets 477, reticulocytes 412 and CT of the neck showed occlusive thrombus within the right internal jugular vein with associated inflammatory stranding in the lower right neck proximal to the insertion of the right sided Port-A-Cath. Hospitalist admission was requested. [facility] apparently [facility]ologists due to hostile behavior. He currently does not have a PCP or hematologist.   Hospital Course:  1. Right internal jugular DVT +/- septic thrombophlebitis: Patient presented with right-sided neck pain and CT scan revealed occlusive thrombus within the right internal jugular vein, with associated inflammatory stranding in the lower right neck, proximal to the insertion of the right-sided Port-A-Cath. Managed initially with iv Heparin infusion, as well as Coumadin per pharmacy (he had been on low dose  Coumadin PTA). Switched to Lovenox bridge on 12/11/12. Started on Vancomycin/Zosyn, but Dr Daiva Eves provided ID consultation, and has recommended Zyvox in place of Vancomycin. Blood cultures and throat streptococcal cultures were negative. As of 12/13/22, patient remained afebrile, and wcc trended down steadily. Dr Waverly Ferrari provided vascular surgical consult on 12/13/12, and has advised against Port-A-Cath removal. Repeat neck CT scan of 12/14/12, showed interval retraction of the clot in the right internal jugular vein, with blood flow around the clot, as well as decreased inflammation/edema around the area of the thrombus. Per ID, antibiotics were discontinued on 12/15/12 and patient remained stable clinically, with no relapse of pyrexia. INR therapeutic since 12/15/12. Lovenox was discontinued effective 12/17/12.  2. Hemoglobin SS disease with vaso-occlusive crisis: Although the patient's main pain was initially on the right side of the neck, he did also complain of generalized pains, and reticulocyte count was found to be elevated. Managed with hypotonic IV fluids and opioid analgesics, with satisfactory response. Reticulocyte count trended down, and as of 12/16/12, patient seemed quite comfortable.  3. Sickle cell anemia: Baseline hemoglobin appears to be 7.5-8.0. HB dropped to 6.3 on 12/13/12 and patient was transfused with 2 units PRBC on 12/12/12, with bump in HB to 8.0. HB remained stable/satisfactory, thereafter.  4. Iron overload. Patient has a history of iron overload, and pre-admission, was on Exjade therapy. Managed with Desferrioxamine during this hospitalization. Exjade resumed on discharge.  5. Leukocytosis: This appears chronic (query Baseline 15K-20K), evidently exacerbated by current infective process. WCC was 36.o at presentation, but as decribed above, wcc trended down significantly during hospitalization, and was down to 13.8 on 12/16/12.    Procedures:  See Below.  Consultations: Dr Daiva Eves,  ID. Dr Waverly Ferrari, VVS.   Discharge Exam: Filed Vitals:   12/16/12 1340 12/16/12 2144 12/16/12 2245 12/17/12 0555  BP: 114/53 123/32 108/63 107/47  Pulse: 72 74 77 68  Temp: 98.5 F (36.9 C) 99 F (37.2 C) 98.8 F (37.1 C) 99.1 F (37.3 C)  TempSrc: Oral Oral Oral Oral  Resp: 16 18  18   Height:      Weight:      SpO2: 96% 100%  97%    General: Comfortable, alert, communicative, fully oriented, not short of breath at rest.  HEENT: Moderate clinical pallor, mildly jaundiced, no conjunctival injection or discharge.  NECK: Supple, JVP not seen, no carotid bruits, no palpable lymphadenopathy, no palpable goiter. Palpable cordlike and tender right IJ vein has significantly improved.  CHEST: Clinically clear to auscultation, no wheezes, no crackles.  HEART: Sounds 1 and 2 heard, normal, regular, no murmurs.  ABDOMEN: Full, soft, non-tender, no palpable organomegaly, no palpable masses, normal bowel sounds.  GENITALIA: Not examined.  LOWER EXTREMITIES: No pitting edema, palpable peripheral pulses.  MUSCULOSKELETAL SYSTEM: Unremarkable.  CENTRAL NERVOUS SYSTEM: No focal neurologic deficit on gross examination.  Discharge Instructions      Discharge Orders   Future Orders Complete By Expires     Diet general  As directed     Increase activity slowly  As directed         Medication List    STOP taking these medications       promethazine 6.25 MG/5ML syrup  Commonly known as:  PHENERGAN      TAKE these medications       albuterol 108 (90 BASE) MCG/ACT inhaler  Commonly known as:  PROVENTIL HFA;VENTOLIN HFA  Inhale 2 puffs into the lungs every 6 (six) hours as needed for wheezing.     deferasirox 500 MG disintegrating tablet  Commonly known as:  EXJADE  Take 4 tablets (2,000 mg total) by mouth daily.     diphenhydrAMINE 25 mg capsule  Commonly known as:  BENADRYL  Take 25 mg by mouth every 6 (six) hours as needed. Itching.     folic acid 1 MG tablet   Commonly known as:  FOLVITE  Take 2 pills a day.     HYDROmorphone 8 MG tablet  Commonly known as:  DILAUDID  Take 1 tablet (8 mg total) by mouth every 6 (six) hours as needed. Please fill on 5/29     HYDROmorphone HCl 16 MG T24a  Take 1 tablet (16 mg total) by mouth daily. Please fill on 5/29.     hydroxyurea 500 MG capsule  Commonly known as:  HYDREA  Take 1 capsule (500 mg total) by mouth 3 (three) times daily.     temazepam 15 MG capsule  Commonly known as:  RESTORIL  Take 1 capsule (15 mg total) by mouth at bedtime as needed for sleep.     warfarin 7.5 MG tablet  Commonly known as:  COUMADIN  Take 1 tablet (7.5 mg total) by mouth one time only at 6 PM.       Allergies  Allergen Reactions  . Morphine And Related Hives    Patient states can take Dilaudid with benadryl  . Adhesive (Tape)   . Latex    Follow-up Information   Please follow up. (Follow up with Primary MD.)       Please follow up. (For PT/INR check on 12/19/12. )        The  results of significant diagnostics from this hospitalization (including imaging, microbiology, ancillary and laboratory) are listed below for reference.    Significant Diagnostic Studies: X-ray Chest Pa And Lateral   12/11/2012   *RADIOLOGY REPORT*  Clinical Data: Right-sided port catheter is painful with pain and swelling involving the right side of the neck  CHEST - 2 VIEW  Comparison: 10/05/2012; 04/24/2012; 10/03/2011  Findings:  Grossly unchanged borderline enlarged cardiac silhouette.  Normal mediastinal contours.  Stable positioning of support apparatus.  No pneumothorax.  Overall improved aeration of the lungs with persistent mild diffuse slightly nodular thickening of the pulmonary interstitium with pleural parenchymal thickening along the right minor fissure. No focal airspace opacities.  There is chronic blunting of the right costophrenic angle without definite pleural effusion.  No definite evidence of edema.  No pneumothorax or  evidence of edema.  Unchanged bones.  Post cholecystectomy.  IMPRESSION: 1.  Overall improved aeration of the lungs with persistent interstitial thickening / bronchitic change. 2. Stable positioning of support apparatus.   Original Report Authenticated By: Tacey Ruiz, MD   Ct Soft Tissue Neck W Contrast  12/14/2012   *RADIOLOGY REPORT*  Clinical Data: Neck swelling.  Thrombus in the right internal jugular vein.  CT NECK WITH CONTRAST  Technique:  Multidetector CT imaging of the neck was performed with intravenous contrast.  Contrast: OMNIPAQUE IOHEXOL 300 MG/ML  SOLN  Comparison: CT scan dated 12/10/2012  Findings: The thrombus in the right internal jugular vein has retracted slightly and there is now slight flow of blood around the thrombus through the internal jugular vein.  The thrombus extends from the level of insertion of the Port-A-Cath up into the neck over an 8 cm length.  There is decreased edema/inflammation in the adjacent soft tissues.  No visible abscess.  The thyroid gland is normal.  No significant adenopathy.  Lung apices are clear.  IMPRESSION:  1.  Interval retraction of the clot in the right internal jugular vein.  There is now blood flow around the clot. 2.  Decreased inflammation/edema around the area of the thrombus.   Original Report Authenticated By: Francene Boyers, M.D.   Ct Soft Tissue Neck W Contrast  12/10/2012   *RADIOLOGY REPORT*  Clinical Data: The neck swelling, pain  CT NECK WITH CONTRAST  Technique:  Multidetector CT imaging of the neck was performed with intravenous contrast.  Contrast: 80mL OMNIPAQUE IOHEXOL 300 MG/ML  SOLN  Comparison: Prior radiograph from 10/05/2012.  Findings: The visualized portions of the intracranial brain are unremarkable.  The orbits are unremarkable.  The paranasal sinuses are clear.  The oral cavity, oropharynx, and nasopharynx is unremarkable. [place] and hypopharynx is unremarkable.  The thyroid is normal.  No pathologically enlarged  lymph nodes are identified within the neck.  A filling defect is seen within the mildly expanded right internal jugular vein, proximal to the insertion of the right-sided Port-A- Cath (series 6, image 56). Distally, the right internal jugular vein appears completely occluded.  There is associated inflammatory stranding within the soft tissues around the occluded right internal jugular vein in the lower right neck, deep to the right sternocleidomastoid muscle.  The right common, internal, and external carotid arteries are normal. The left internal jugular vein is patent.  The partially visualized lungs are clear.  No osseous abnormalities are identified.  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  Original Report Authenticated By: Rise Mu, M.D.    Microbiology: Recent Results (from the past 240 hour(s))  CULTURE, BLOOD (ROUTINE X 2)     Status: None   Collection Time    12/10/12  7:14 PM      Result Value Range Status   Specimen Description BLOOD   Final   Special Requests BOTTLES DRAWN AEROBIC AND ANAEROBIC   Final   Culture  Setup Time 12/11/2012 01:12   Final   Culture NO GROWTH 5 DAYS   Final   Report Status 12/17/2012 FINAL   Final  RAPID STREP SCREEN     Status: None   Collection Time    12/10/12  8:36 PM      Result Value Range Status   Streptococcus, Group A Screen (Direct) NEGATIVE  NEGATIVE Final   Comment: (NOTE)     A Rapid Antigen test may result negative if the antigen level in the     sample is below the detection level of this test. The FDA has not     cleared this test as a stand-alone test therefore the rapid antigen     negative result has reflexed to a Group A Strep culture.  CULTURE, GROUP A STREP     Status: None   Collection Time    12/10/12  8:36 PM      Result Value Range Status   Specimen Description  THROAT   Final   Special Requests NONE   Final   Culture No Beta Hemolytic Streptococci Isolated   Final   Report Status 12/12/2012 FINAL   Final  CULTURE, BLOOD (ROUTINE X 2)     Status: None   Collection Time    12/10/12  8:48 PM      Result Value Range Status   Specimen Description BLOOD RIGHT AC   Final   Special Requests BOTTLES DRAWN AEROBIC AND ANAEROBIC 3 CC   Final   Culture  Setup Time 12/11/2012 01:12   Final   Culture NO GROWTH 5 DAYS   Final   Report Status 12/17/2012 FINAL   Final     Labs: Basic Metabolic Panel:  Recent Labs Lab 12/13/12 0435 12/14/12 0540 12/15/12 0610 12/16/12 0347 12/17/12 0423  NA 136 134* 132* 135 136  K 3.3* 4.2 3.9 4.3 3.7  CL 100 101 98 100 99  CO2 29 27 28 31 29   GLUCOSE 130* 100* 105* 108* 107*  BUN 7 6 6 7 8   CREATININE 0.60 0.56 0.53 0.57 0.52  CALCIUM 8.8 8.9 9.1 9.4 9.4   Liver Function Tests:  Recent Labs Lab 12/11/12 0715  AST 32  ALT 17  ALKPHOS 104  BILITOT 6.3*  PROT 6.9  ALBUMIN 3.6   No results found for this basename: LIPASE, AMYLASE,  in the last 168 hours No results found for this basename: AMMONIA,  in the last 168 hours CBC:  Recent Labs Lab 12/10/12 1914  12/14/12 0540 12/15/12 0610 12/15/12 1120 12/16/12 0347 12/17/12 0423  WBC 36.0*  < > 12.5* 11.6* 14.8* 13.8* 13.9*  NEUTROABS 28.8*  --   --   --  9.4*  --   --   HGB 7.5*  < > 7.9* 8.3* 9.1* 8.6* 8.5*  HCT 19.8*  < > 22.1* 22.8* 25.4* 24.2* 23.9*  MCV 90.4  < > 89.8 89.8 89.1 91.0 90.9  PLT 477*  < > 338 335 536* 626* 619*  < > = values in this interval not displayed.  Cardiac Enzymes: No results found for this basename: CKTOTAL, CKMB, CKMBINDEX, TROPONINI,  in the last 168 hours BNP: BNP (last 3 results) No results found for this basename: PROBNP,  in the last 8760 hours CBG: No results found for this basename: GLUCAP,  in the last 168 hours     Signed:  Naraya Stoneberg,CHRISTOPHER  [facility] 12/17/2012, 1:10 PM

## 2012-12-16 NOTE — Progress Notes (Signed)
[facility]    Subjective: Feels neck pain is about the same, co bilateral knee pain  Antibiotics:  Anti-infectives   Start     Dose/Rate Route Frequency Ordered Stop   12/12/12 2000  piperacillin-tazobactam (ZOSYN) IVPB 3.375 g  Status:  Discontinued     3.375 g 12.5 mL/hr over 240 Minutes Intravenous Every 8 hours 12/12/12 1107 12/14/12 1528   12/12/12 1130  piperacillin-tazobactam (ZOSYN) IVPB 3.375 g     3.375 g 100 mL/hr over 30 Minutes Intravenous  Once 12/12/12 1107 12/12/12 1139   12/11/12 1300  linezolid (ZYVOX) tablet 600 mg  Status:  Discontinued     600 mg Oral Every 12 hours 12/11/12 1135 12/15/12 1258   12/11/12 0100  piperacillin-tazobactam (ZOSYN) IVPB 3.375 g  Status:  Discontinued     3.375 g 12.5 mL/hr over 240 Minutes Intravenous 3 times per day 12/11/12 0050 12/12/12 1107   12/11/12 0000  vancomycin (VANCOCIN) IVPB 750 mg/150 ml premix  Status:  Discontinued     750 mg 150 mL/hr over 60 Minutes Intravenous Every 8 hours 12/10/12 2351 12/11/12 1135      Medications: Scheduled Meds: . deferoxamine (DESFERAL) IV  2,000 mg Intravenous QHS  . enoxaparin (LOVENOX) injection  60 mg Subcutaneous Q12H  . folic acid  1 mg Oral Daily  . HYDROmorphone HCl  16 mg Oral Q24H  . hydroxyurea  500 mg Oral TID  . sodium chloride  3 mL Intravenous Q12H  . warfarin  2 mg Oral ONCE-1800  . Warfarin - Pharmacist Dosing Inpatient   Does not apply q1800   Continuous Infusions: . sodium chloride 75 mL/hr at 12/16/12 0902   PRN Meds:.acetaminophen, albuterol, diphenhydrAMINE, HYDROmorphone (DILAUDID) injection, HYDROmorphone, ondansetron (ZOFRAN) IV, ondansetron, promethazine, sodium chloride, temazepam   Objective: Weight change:   Intake/Output Summary (Last 24 hours) at 12/16/12 1710 Last data filed at 12/16/12 1230  Gross per 24 hour  Intake   1740 ml  Output   1550 ml  Net    190 ml   Blood pressure 114/53, pulse 72, temperature 98.5  F (36.9 C), temperature source Oral, resp. rate 16, height 5\' 5"  (1.651 m), weight 131 lb 1.6 oz (59.467 kg), SpO2 96.00%. Temp:  [98.2 F (36.8 C)-98.7 F (37.1 C)] 98.5 F (36.9 C) (07/06 1340) Pulse Rate:  [71-114] 72 (07/06 1340) Resp:  [16-18] 16 (07/06 1340) BP: (114-120)/(40-53) 114/53 mmHg (07/06 1340) SpO2:  [96 %-99 %] 96 % (07/06 1340)  Physical Exam: HEENT: pupils reactive to light and accommodation, EOMI, oropharynx clear and without exudate, neck is swollen and tender to palpation  CVS regular rate, normal r, II/VI systolic murmur  Chest: diminished bs at left base but otherwise clear  Abdomen: soft nondistended, normal bowel sounds,  Extremities: no clubbing or edema noted bilaterally  Skin: tunnelled catheter not overltly swollen or hot  Neuro: nonfocal, strength and sensation intact   Lab Results:  Recent Labs  12/15/12 1120 12/16/12 0347  WBC 14.8* 13.8*  HGB 9.1* 8.6*  HCT 25.4* 24.2*  PLT 536* 626*    BMET  Recent Labs  12/15/12 0610 12/16/12 0347  NA 132* 135  K 3.9 4.3  CL 98 100  CO2 28 31  GLUCOSE 105* 108*  BUN 6 7  CREATININE 0.53 0.57  CALCIUM 9.1 9.4    Micro Results: Recent Results (from the past 240 hour(s))  CULTURE, BLOOD (ROUTINE X 2)     Status: None  Collection Time    12/10/12  7:14 PM      Result Value Range Status   Specimen Description BLOOD   Final   Special Requests BOTTLES DRAWN AEROBIC AND ANAEROBIC   Final   Culture  Setup Time 12/11/2012 01:12   Final   Culture     Final   Value:        BLOOD CULTURE RECEIVED NO GROWTH TO DATE CULTURE WILL BE HELD FOR 5 DAYS BEFORE ISSUING A FINAL NEGATIVE REPORT   Report Status PENDING   Incomplete  RAPID STREP SCREEN     Status: None   Collection Time    12/10/12  8:36 PM      Result Value Range Status   Streptococcus, Group A Screen (Direct) NEGATIVE  NEGATIVE Final   Comment: (NOTE)     A Rapid Antigen test may result negative if the antigen level in the     sample  is below the detection level of this test. The FDA has not     cleared this test as a stand-alone test therefore the rapid antigen     negative result has reflexed to a Group A Strep culture.  CULTURE, GROUP A STREP     Status: None   Collection Time    12/10/12  8:36 PM      Result Value Range Status   Specimen Description THROAT   Final   Special Requests NONE   Final   Culture No Beta Hemolytic Streptococci Isolated   Final   Report Status 12/12/2012 FINAL   Final  CULTURE, BLOOD (ROUTINE X 2)     Status: None   Collection Time    12/10/12  8:48 PM      Result Value Range Status   Specimen Description BLOOD RIGHT AC   Final   Special Requests BOTTLES DRAWN AEROBIC AND ANAEROBIC 3 CC   Final   Culture  Setup Time 12/11/2012 01:12   Final   Culture     Final   Value:        BLOOD CULTURE RECEIVED NO GROWTH TO DATE CULTURE WILL BE HELD FOR 5 DAYS BEFORE ISSUING A FINAL NEGATIVE REPORT   Report Status PENDING   Incomplete    Studies/Results: No results found.    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, chronic porta-cath admitted with neck pain subjective chills found to have occlusive thrombus associated with portacath currently on heparin, zosyn and vancomycin (though vanco and heparin not compatible via same line) Cultures negative, Repeat CT scan shows improvement in clot. He is afebrile and doing fine with narrowing to zyvox  #1 IJ thrombus +/-septic thrombophlebitis:   NOT CLEARLY a case of septic thrombophlebitis and have narrowed to zyvox and yesterday dc it as well. NO fevers off abx. .CT scan shows clot burden diminished  No clear need for further abx  I will sign off for now please do not hesitate to call us back for further questions.     LOS: 6 days   Acey Lav 12/16/2012, 5:10 PM

## 2012-12-16 NOTE — Progress Notes (Signed)
[facility] PROGRESS NOTE  Christopher Warren:[id number] DOB: 04-05-1976 DOA: 12/10/2012 PCP: No primary provider on file.  Assessment/Plan: Active Problems:   Leukocytosis   Fever   Sickle cell crisis   Internal jugular (IJ) vein thromboembolism, acute   Septic thrombophlebitis   Screen for STD (sexually transmitted disease)   Thrombocytopathia    1. Right internal jugular DVT +/- septic thrombophlebitis: Patient presented with right-sided neck pain and CT scan revealed occlusive thrombus within the right internal jugular vein, with associated inflammatory stranding in the lower right neck, proximal to the insertion of the right-sided Port-A-Cath. Managed initially with iv Heparin infusion, as well as Coumadin per pharmacy (he had been on low dose Coumadin PTA). Switched to Lovenox bridge on 12/11/12. Started on Vancomycin/Zosyn, but Dr Daiva Eves provided ID consultation, and has recommended Zyvox in place of Vancomycin (Now day #6 antibiotics). Blood cultures and throat strept test are negative so far. Patient is afebrile, and wcc is trending down steadily. Dr Waverly Ferrari provided vascular surgical consult on 12/13/12, and has advised against Port-A-Cath removal. Repeat neck CT scan of 12/14/12, showed interval retraction of the clot in the right internal jugular vein, with blood flow around the clot, as well as decreased inflammation/edema around the area of the thrombus. Per ID, antibiotics have been discontinued on 12/15/12. Patient appears stable, clinically, with no relapse of pyrexia. INR therapeutic since 12/15/12.  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 Managing with hypotonic IV fluids and pain management, with improvement. Reticulocyte count is trending down, and patient seems comfortable today.  3. Sickle  cell anemia: Baseline hemoglobin appears to be 7.5-8.0. HB is 6.3 today. For transfusion of 2 units PRBC on 12/12/12, with bump in HB to 8.0. Following CBC. HB is stable. 4. Iron overload. Patient has a history of iron overload, and pre-admission, was on Exjade therapy. Managing with Desferrioxamine. Will restart Exjade on discharge.  5. Leukocytosis: Appears chronic? Baseline 15-20. But appears exacerbated by current infective process. Patient already on antibiotics. Following CBC, improving.  Code Status: Full Code.  Family Communication:  Disposition Plan: To be determined. Aiming discharge ofn 12/17/12.    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 In the ED, temperature 100.33F WBC 36K, hemoglobin 7.5, platelets 477, reticulocytes 412 and CT of the neck showed occlusive thrombus within the right internal jugular vein with associated inflammatory stranding in the lower right neck proximal to the insertion of the right sided Port-A-Cath. Hospitalist admission was requested. [facility] apparently [facility]ologists due to hostile behavior. He currently does not have a PCP or hematologist.   Consultants:  Dr Daiva Eves, ID.  Procedures:  CT neck.  CXR.   Antibiotics:  Zyvox 12/11/12-12/15/12.  Zosyn 12/10/12-12/15/12.   HPI/Subjective: No new issues.   Objective: Vital signs in last 24 hours: Temp:  [98.2 F (36.8 C)-98.7 F (37.1 C)] 98.2 F (36.8 C) (07/06 0611) Pulse Rate:  [68-114] 114 (07/06 0611) Resp:  [17-18] 18 (07/06 0611) BP: (109-120)/(40-55) 114/40 mmHg (07/06 0611) SpO2:  [95 %-99 %] 99 % (07/06 0611) Weight change:  Last BM Date: 12/12/12  Intake/Output from previous day: 07/05 0701 - 07/06  0700 In: 1170 [P.O.:240; I.V.:930] Out: 500 [Urine:500] Total I/O In: 240 [P.O.:240] Out: 600 [Urine:600]   Physical Exam: General: Comfortable, alert, communicative, fully oriented, not short of breath at rest.  HEENT:  Moderate clinical pallor, mildly jaundiced, no conjunctival injection or discharge. NECK:  Supple, JVP not seen, no carotid bruits, no palpable lymphadenopathy, no palpable goiter. Palpable cordlike and tender right IJ vein has improved.  CHEST:  Clinically clear to auscultation, no wheezes, no crackles. HEART:  Sounds 1 and 2 heard, normal, regular, no murmurs. ABDOMEN:  Full, soft, non-tender, no palpable organomegaly, no palpable masses, normal bowel sounds. GENITALIA:  Not examined. LOWER EXTREMITIES:  No pitting edema, palpable peripheral pulses. MUSCULOSKELETAL SYSTEM:  Unremarkable. CENTRAL NERVOUS SYSTEM:  No focal neurologic deficit on gross examination.  Lab Results:  Recent Labs  12/15/12 1120 12/16/12 0347  WBC 14.8* 13.8*  HGB 9.1* 8.6*  HCT 25.4* 24.2*  PLT 536* 626*    Recent Labs  12/15/12 0610 12/16/12 0347  NA 132* 135  K 3.9 4.3  CL 98 100  CO2 28 31  GLUCOSE 105* 108*  BUN 6 7  CREATININE 0.53 0.57  CALCIUM 9.1 9.4   Recent Results (from the past 240 hour(s))  CULTURE, BLOOD (ROUTINE X 2)     Status: None   Collection Time    12/10/12  7:14 PM      Result Value Range Status   Specimen Description BLOOD   Final   Special Requests BOTTLES DRAWN AEROBIC AND ANAEROBIC   Final   Culture  Setup Time 12/11/2012 01:12   Final   Culture     Final   Value:        BLOOD CULTURE RECEIVED NO GROWTH TO DATE CULTURE WILL BE HELD FOR 5 DAYS BEFORE ISSUING A FINAL NEGATIVE REPORT   Report Status PENDING   Incomplete  RAPID STREP SCREEN     Status: None   Collection Time    12/10/12  8:36 PM      Result Value Range Status   Streptococcus, Group A Screen (Direct) NEGATIVE  NEGATIVE Final   Comment: (NOTE)     A Rapid Antigen test may  result negative if the antigen level in the     sample is below the detection level of this test. The FDA has not     cleared this test as a stand-alone test therefore the rapid antigen     negative result has reflexed to a Group A Strep culture.  CULTURE, GROUP A STREP     Status: None   Collection Time    12/10/12  8:36 PM      Result Value Range Status   Specimen Description THROAT   Final   Special Requests NONE   Final   Culture No Beta Hemolytic Streptococci Isolated   Final   Report Status 12/12/2012 FINAL   Final  CULTURE, BLOOD (ROUTINE X 2)     Status: None   Collection Time    12/10/12  8:48 PM      Result Value Range Status   Specimen Description BLOOD RIGHT [facility]   Final   Special Requests BOTTLES DRAWN AEROBIC AND ANAEROBIC 3 CC   Final   Culture  Setup Time 12/11/2012 01:12   Final   Culture     Final   Value:        BLOOD CULTURE RECEIVED NO GROWTH TO DATE CULTURE WILL BE  HELD FOR 5 DAYS BEFORE ISSUING A FINAL NEGATIVE REPORT   Report Status PENDING   Incomplete     Studies/Results: Ct Soft Tissue Neck W Contrast  12/14/2012   *RADIOLOGY REPORT*  Clinical Data: Neck swelling.  Thrombus in the right internal jugular vein.  CT NECK WITH CONTRAST  Technique:  Multidetector CT imaging of the neck was performed with intravenous contrast.  Contrast: OMNIPAQUE IOHEXOL 300 MG/ML  SOLN  Comparison: CT scan dated 12/10/2012  Findings: The thrombus in the right internal jugular vein has retracted slightly and there is now slight flow of blood around the thrombus through the internal jugular vein.  The thrombus extends from the level of insertion of the Port-A-Cath up into the neck over an 8 cm length.  There is decreased edema/inflammation in the adjacent soft tissues.  No visible abscess.  The thyroid gland is normal.  No significant adenopathy.  Lung apices are clear.  IMPRESSION:  1.  Interval retraction of the clot in the right internal jugular vein.  There is now blood flow  around the clot. 2.  Decreased inflammation/edema around the area of the thrombus.   Original Report Authenticated By: Francene Boyers, M.D.    Medications: Scheduled Meds: . deferoxamine (DESFERAL) IV  2,000 mg Intravenous QHS  . enoxaparin (LOVENOX) injection  60 mg Subcutaneous Q12H  . folic acid  1 mg Oral Daily  . HYDROmorphone HCl  16 mg Oral Q24H  . hydroxyurea  500 mg Oral TID  . sodium chloride  3 mL Intravenous Q12H  . warfarin  2 mg Oral ONCE-1800  . Warfarin - Pharmacist Dosing Inpatient   Does not apply q1800   Continuous Infusions: . sodium chloride 75 mL/hr at 12/16/12 0902   PRN Meds:.acetaminophen, albuterol, diphenhydrAMINE, HYDROmorphone (DILAUDID) injection, HYDROmorphone, ondansetron (ZOFRAN) IV, ondansetron, promethazine, sodium chloride, temazepam    LOS: 6 days   Reagen Haberman,CHRISTOPHER  [facility] Pager [phone]. If 8PM-8AM, please contact night-coverage at [email or web address], password [facility] 12/16/2012, 12:48 PM  LOS: 6 days

## 2012-12-17 LAB — PROTIME-INR
INR: 2.11 — ABNORMAL HIGH (ref 0.00–1.49)
Prothrombin Time: 23 seconds — ABNORMAL HIGH (ref 11.6–15.2)

## 2012-12-17 LAB — CBC
Hemoglobin: 8.5 g/dL — ABNORMAL LOW (ref 13.0–17.0)
MCH: 32.3 pg (ref 26.0–34.0)
RBC: 2.63 MIL/uL — ABNORMAL LOW (ref 4.22–5.81)

## 2012-12-17 LAB — CULTURE, BLOOD (ROUTINE X 2)
Culture: NO GROWTH
Culture: NO GROWTH

## 2012-12-17 LAB — BASIC METABOLIC PANEL
BUN: 8 mg/dL (ref 6–23)
Chloride: 99 mEq/L (ref 96–112)
GFR calc Af Amer: >90 mL/min (ref >90)
Potassium: 3.7 mEq/L (ref 3.5–5.1)

## 2012-12-17 MED ORDER — HYDROXYUREA 500 MG PO CAPS
500.0000 mg | ORAL_CAPSULE | Freq: Three times a day (TID) | ORAL | Status: AC
Start: 2012-12-17 — End: ?

## 2012-12-17 MED ORDER — WARFARIN SODIUM 7.5 MG PO TABS: 7.5000 mg | ORAL_TABLET | Freq: Once | ORAL | Status: DC

## 2012-12-17 MED ORDER — WARFARIN SODIUM 7.5 MG PO TABS
7.5000 mg | ORAL_TABLET | Freq: Once | ORAL | Status: DC
  Filled 2012-12-17: qty 1

## 2012-12-17 MED ORDER — HEPARIN SOD (PORK) LOCK FLUSH 100 UNIT/ML IV SOLN
500.0000 [IU] | INTRAVENOUS | Status: AC | PRN
  Administered 2012-12-17: 500 [IU]

## 2012-12-17 MED ORDER — DEFERASIROX 500 MG PO TBSO: 2000.0000 mg | ORAL_TABLET | Freq: Every day | ORAL | Status: DC

## 2012-12-17 MED ORDER — FOLIC ACID 1 MG PO TABS
ORAL_TABLET | ORAL | Status: AC
Start: 2012-12-17 — End: ?

## 2012-12-17 MED ORDER — WARFARIN SODIUM 7.5 MG PO TABS: ORAL_TABLET | ORAL | Status: DC

## 2012-12-17 MED ORDER — HYDROMORPHONE HCL 8 MG PO TABS: 8.0000 mg | ORAL_TABLET | Freq: Four times a day (QID) | ORAL | Status: DC | PRN

## 2012-12-17 MED ORDER — HYDROMORPHONE HCL ER 16 MG PO T24A: 16.0000 mg | EXTENDED_RELEASE_TABLET | ORAL | Status: DC

## 2012-12-17 NOTE — Progress Notes (Addendum)
ANTICOAGULATION CONSULT NOTE - Follow-up  Pharmacy Consult for Lovenox/Warfarin  Indication: DVT in R IJ vein  Allergies  Allergen Reactions  . Morphine And Related Hives    Patient states can take Dilaudid with benadryl  . Adhesive (Tape)   . Latex     Patient Measurements: Height: 5\' 5"  (165.1 cm) Weight: 131 lb 1.6 oz (59.467 kg) IBW/kg (Calculated) : 61.5  Labs:  Recent Labs  12/15/12 0610 12/15/12 1120 12/16/12 0347 12/17/12 0423  HGB 8.3* 9.1* 8.6* 8.5*  HCT 22.8* 25.4* 24.2* 23.9*  PLT 335 536* 626* 619*  LABPROT 16.1*  --  22.3* 23.0*  INR 1.32  --  2.03* 2.11*  CREATININE 0.53  --  0.57 0.52    Estimated Creatinine Clearance: 109.5 ml/min (by C-G formula based on Cr of 0.52).   Medications:  Scheduled:  . deferoxamine (DESFERAL) IV  2,000 mg Intravenous QHS  . enoxaparin (LOVENOX) injection  60 mg Subcutaneous Q12H  . folic acid  1 mg Oral Daily  .  HYDROmorphone (DILAUDID) injection  3 mg Intravenous Q2H  . HYDROmorphone HCl  16 mg Oral Q24H  . hydroxyurea  500 mg Oral TID  . sodium chloride  3 mL Intravenous Q12H  . Warfarin - Pharmacist Dosing Inpatient   Does not apply q1800   Infusions:  . sodium chloride 75 mL/hr at 12/17/12 0451    Assessment: 37 YO M with sickle-cell disease, presents 12/11/12 with acute pain crisis, including pain and swelling to R side of neck. CT revealed occlusive thrombus of R IJ proximal to port-a-cath.  He is on (low-dose) warfarin prior to admission for port-a-cath "maintenance."  INR was 1.22 on admission. He had been started on heparin gtt and resumed warfarin.  Heparin changed to Lovenox 7/1.  Home warfarin dose was 2mg  (again for catheter "maintenance", not for TREATMENT)  Warfarin held 7/3 pending VVS evaluation for possible removal of port-a-cath with thrombectomy.  VVS recommends leaving the port-a-cath so resumed warfarin 7/4.  INR today remains therapeutic.   CBC: hgb is stable No bleeding/complications  reported.  Goal of Therapy:  INR 2-3 Monitor platelets by anticoagulation protocol: Yes   Plan:   Coumadin 7.5 mg PO once today.  Daily INR.  Patient has now completed 5 day overlap with warfarin/lovenox and INR remains >2 x2 consecutive days. Ok to d/c Lovenox now (MD must write D/C order)  Darrol Angel, PharmD Pager: [phone] 12/17/2012 8:51 AM   ADDENDUM: If patient goes home today, suggest sending home on warfarin 7.5mg  daily with next INR check tomorrow (7/8) or Tues (7/9).  Darrol Angel, PharmD Pager: [phone] 12/17/2012 9:26 AM

## 2012-12-17 NOTE — Progress Notes (Signed)
I attempted again to speak with pt about appt for PT/INR after nurse told me that  pt and Dr Brien Few had spoken and agreed on moving the appt date to 7/11. Pt again told me he needed to speak to his ride. He further asked me if he really needed to take the Coumadin to which I told him he absolutely need to. I gave him some time to call his ride and then went to the room. He was not available because he is in the shower. I have left information on the Adult Clinic with the nurse so provide to him at d/c that war he can call and make the appt for a time that's convenient for him.  Algernon Huxley RN BNS  [phone]

## 2012-12-17 NOTE — Progress Notes (Signed)
Consult received to see pt in regards to PT/INR follow up. I met with pt at bedside. He stated he does not have a PCP at this time and is trying to [facility] with [facility]. Pt needs his PT/INR checked on 7/9. I will refer pt to the [facility] but I need to know what time is best for pt. He stated he lives in [place] and needs to find a ride. Pt asked me to check with him later after he speaks with his potential ride and when I did he stated no one is available to bring him here on Wednesday. I asked pt if It would work out better for him if I found a clinic for him in [place]. Pt stated he wants to speak with Dr Brien Few. I tried to discuss this further with him but he insisted he wanted to speak with Dr Brien Few before he can make any decision. I spoke with pt's RN Charline Bills about this, she stated she had a page out to Dr Brien Few and will let me know what is decided. I will continue to follow.  Algernon Huxley RN BSN   [phone]

## 2012-12-19 NOTE — Progress Notes (Signed)
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  CURRENT THERAPY: 1. Hydrea 500 mg p.o. t.i.d. 2. Folic acid 1 mg p.o. daily. 3. Exjade 1000 mg p.o. daily.  INTERIM HISTORY:  Mr. Kohlmeyer comes in for followup.  He is still having issues with these "crises."  He is still having pain problems.  His spleen is quite enlarged.  I just keep wondering if his spleen does not need to come out.  It is unusual for a patient with sickle cell to have splenomegaly.  He has had no fever.  He has had no leg swelling.  He has had pains in his joints.  This appears be more chronic than anything else.  He has had no cough.  He has had no weight loss.  He is trying to exercise.  It appears there has been no change in bowel or bladder habits.  PHYSICAL EXAMINATION:  General:  This is a fairly well-developed, well- nourished African American gentleman in no obvious distress.  Vital Signs:  Temperature of 98.7, pulse 68, heart rate 16, blood pressure 112/60.  Weight is 133.  Head and Neck:  Normocephalic, atraumatic skull.  There are no ocular or oral lesions.  There is no scleral icterus.  There is no adenopathy in the neck.  Lungs:  Clear bilaterally.  Cardiac:  Regular rate and rhythm with a normal S1, S2. There are no murmurs, rubs, or bruits.  Abdomen:  Soft.  Spleen tip is about 5 cm below the left costal margin.  There is no hepatomegaly. There is some tenderness over the left side of the abdomen. Extremities:  No clubbing, cyanosis, or edema.  Joints:  He has good range motion of the joints.  Skin:  No rashes.  No ulcerations. Neurological:  No focal neurological deficits.  IMPRESSION:  Mr. Skelton is a 37 year old gentleman with hemoglobin SS disease.  He has frequent crises.  His hemoglobin typically is around 7- 8.  We have been doing exchange transfusions on him.  He has not wanted a Port-A-Cath in.  This would make life a whole lot easier for him.  Again, I think his spleen  is going to have to come out.  I just wonder if the splenomegaly is contributing to his crises frequency.  We will go ahead and plan for a splenectomy.  He will have to be admitted.  We will get this set up for November.  We will continue to watch his iron.  He is on Exjade.  We will see what his iron studies show.  We will have him come back to see [place] probably before he goes to the hospital for his spleen removal.    ______________________________ [name], M.D. PRE/MEDQ  D:  12/18/2012  T:  12/19/2012  Job:  [id number]

## 2012-12-21 ENCOUNTER — Ambulatory Visit: Payer: Medicaid Other | Admitting: Hematology & Oncology

## 2012-12-21 ENCOUNTER — Ambulatory Visit: Payer: Medicaid Other

## 2012-12-21 ENCOUNTER — Other Ambulatory Visit: Payer: Medicaid Other | Admitting: Lab

## 2013-01-02 ENCOUNTER — Other Ambulatory Visit: Payer: Medicaid Other | Admitting: Lab

## 2013-01-02 ENCOUNTER — Ambulatory Visit: Payer: Medicaid Other | Admitting: Hematology & Oncology

## 2013-01-02 ENCOUNTER — Ambulatory Visit: Payer: Medicaid Other

## 2013-01-11 ENCOUNTER — Encounter (HOSPITAL_COMMUNITY)
Admission: RE | Admit: 2013-01-11 | Discharge: 2013-01-11 | Disposition: A | Discharge status: Home / Self Care | Payer: Medicaid Other | Source: Ambulatory Visit | Attending: Hematology & Oncology | Admitting: Hematology & Oncology

## 2013-01-11 DIAGNOSIS — D571 Sickle-cell disease without crisis: Secondary | ICD-10-CM | POA: Insufficient documentation

## 2013-01-14 ENCOUNTER — Telehealth: Payer: Self-pay

## 2013-01-14 NOTE — Telephone Encounter (Signed)
This CM spoke with Christopher Warren to advise that after review of his records we are unable to accept him as a patient to establish care at [facility]. This CM advised Christopher Warren to contact his CM at [facility] agency to get assistance with finding a provider, this CM also referenced to contact providers that Dr. Joycelyn Das office referred him to once he was dismissed from that practice.   Karoline Caldwell, RN, BSN, [place]   [phone]

## 2013-01-17 ENCOUNTER — Encounter: Payer: Self-pay | Admitting: Urology

## 2013-01-18 ENCOUNTER — Telehealth: Payer: Self-pay | Admitting: Urology

## 2013-01-18 NOTE — Telephone Encounter (Signed)
Patient dismissed from [facility] and all [facility] practices. Dismissal letters from [facility] and the [facility] along with the Security notification, Authorization for disclosure form and fee scale for reproduction of records sent out by certified mail on 01/18/2013.rmf

## 2013-01-19 ENCOUNTER — Encounter (HOSPITAL_COMMUNITY): Payer: Self-pay | Admitting: Emergency Medicine

## 2013-01-19 ENCOUNTER — Emergency Department (HOSPITAL_COMMUNITY)
Admission: EM | Admit: 2013-01-19 | Discharge: 2013-01-19 | Disposition: A | Discharge status: Home / Self Care | Payer: Medicaid Other | Attending: Emergency Medicine | Admitting: Emergency Medicine

## 2013-01-19 DIAGNOSIS — Z7901 Long term (current) use of anticoagulants: Secondary | ICD-10-CM | POA: Insufficient documentation

## 2013-01-19 DIAGNOSIS — D5700 Hb-SS disease with crisis, unspecified: Secondary | ICD-10-CM | POA: Insufficient documentation

## 2013-01-19 DIAGNOSIS — M542 Cervicalgia: Secondary | ICD-10-CM | POA: Insufficient documentation

## 2013-01-19 DIAGNOSIS — F17200 Nicotine dependence, unspecified, uncomplicated: Secondary | ICD-10-CM | POA: Insufficient documentation

## 2013-01-19 DIAGNOSIS — R011 Cardiac murmur, unspecified: Secondary | ICD-10-CM | POA: Insufficient documentation

## 2013-01-19 DIAGNOSIS — Z91040 Latex allergy status: Secondary | ICD-10-CM | POA: Insufficient documentation

## 2013-01-19 DIAGNOSIS — J45909 Unspecified asthma, uncomplicated: Secondary | ICD-10-CM | POA: Insufficient documentation

## 2013-01-19 DIAGNOSIS — Z79899 Other long term (current) drug therapy: Secondary | ICD-10-CM | POA: Insufficient documentation

## 2013-01-19 DIAGNOSIS — I82409 Acute embolism and thrombosis of unspecified deep veins of unspecified lower extremity: Secondary | ICD-10-CM

## 2013-01-19 LAB — CBC WITH DIFFERENTIAL/PLATELET
Basophils Relative: 0 % (ref 0–1)
Eosinophils Relative: 0 % (ref 0–5)
Hemoglobin: 7.7 g/dL — ABNORMAL LOW (ref 13.0–17.0)
Lymphocytes Relative: 14 % (ref 12–46)
MCH: 32.1 pg (ref 26.0–34.0)
Monocytes Absolute: 2.1 10*3/uL — ABNORMAL HIGH (ref 0.1–1.0)
Neutrophils Relative %: 74 % (ref 43–77)
RBC: 2.4 MIL/uL — ABNORMAL LOW (ref 4.22–5.81)

## 2013-01-19 LAB — URINALYSIS, ROUTINE W REFLEX MICROSCOPIC
Leukocytes, UA: NEGATIVE
Nitrite: NEGATIVE
Specific Gravity, Urine: 1.009 (ref 1.005–1.030)
pH: 6 (ref 5.0–8.0)

## 2013-01-19 LAB — BASIC METABOLIC PANEL
CO2: 26 mEq/L (ref 19–32)
Chloride: 101 mEq/L (ref 96–112)
Potassium: 3.7 mEq/L (ref 3.5–5.1)
Sodium: 137 mEq/L (ref 135–145)

## 2013-01-19 LAB — RETICULOCYTES
RBC.: 2.4 MIL/uL — ABNORMAL LOW (ref 4.22–5.81)
Retic Count, Absolute: 396 10*3/uL — ABNORMAL HIGH (ref 19.0–186.0)

## 2013-01-19 LAB — TROPONIN I: Troponin I: <0.3 ng/mL (ref <0.30)

## 2013-01-19 MED ORDER — HYDROMORPHONE HCL PF 1 MG/ML IJ SOLN
1.0000 mg | Freq: Once | INTRAMUSCULAR | Status: AC
  Administered 2013-01-19: 1 mg via INTRAVENOUS
  Filled 2013-01-19: qty 1

## 2013-01-19 MED ORDER — PROMETHAZINE HCL 25 MG/ML IJ SOLN
25.0000 mg | Freq: Once | INTRAMUSCULAR | Status: AC
  Administered 2013-01-19: 25 mg via INTRAVENOUS
  Filled 2013-01-19: qty 1

## 2013-01-19 MED ORDER — OXYCODONE-ACETAMINOPHEN 5-325 MG PO TABS: 2.0000 | ORAL_TABLET | Freq: Four times a day (QID) | ORAL | Status: DC | PRN

## 2013-01-19 MED ORDER — HEPARIN SOD (PORK) LOCK FLUSH 100 UNIT/ML IV SOLN: 500.0000 [IU] | Freq: Once | INTRAVENOUS | Status: DC

## 2013-01-19 MED ORDER — HYDROMORPHONE HCL PF 2 MG/ML IJ SOLN
2.0000 mg | Freq: Once | INTRAMUSCULAR | Status: AC
  Administered 2013-01-19: 2 mg via INTRAVENOUS
  Filled 2013-01-19: qty 1

## 2013-01-19 MED ORDER — ENOXAPARIN SODIUM 60 MG/0.6ML ~~LOC~~ SOLN: 1.5000 mg/kg | Freq: Every day | SUBCUTANEOUS | Status: DC

## 2013-01-19 MED ORDER — HEPARIN SOD (PORK) LOCK FLUSH 100 UNIT/ML IV SOLN
INTRAVENOUS | Status: AC
  Administered 2013-01-19: 500 [IU] via INTRAVENOUS
  Filled 2013-01-19: qty 5

## 2013-01-19 MED ORDER — SODIUM CHLORIDE 0.9 % IV BOLUS (SEPSIS)
1000.0000 mL | Freq: Once | INTRAVENOUS | Status: AC
  Administered 2013-01-19: 1000 mL via INTRAVENOUS

## 2013-01-19 MED ORDER — HEPARIN SOD (PORK) LOCK FLUSH 100 UNIT/ML IV SOLN: 500.0000 [IU] | Freq: Once | INTRAVENOUS | Status: AC

## 2013-01-19 MED ORDER — DIPHENHYDRAMINE HCL 50 MG/ML IJ SOLN
25.0000 mg | Freq: Once | INTRAMUSCULAR | Status: AC
  Administered 2013-01-19: 25 mg via INTRAVENOUS
  Filled 2013-01-19: qty 1

## 2013-01-19 NOTE — ED Notes (Signed)
Patient given urinal.

## 2013-01-19 NOTE — ED Notes (Signed)
Pt. Claimed of having pain on right side of his neck and stated  " i have a blood clot  according to the Doctor who saw me last Thursday". Pt. Is on lovenox  And had a dose today. Denies chest pain or SOB.

## 2013-01-19 NOTE — ED Provider Notes (Signed)
CSN: [id number]     Arrival date & time 01/19/13  2047 History     First MD Initiated Contact with Patient 01/19/13 2052     Chief Complaint  Patient presents with  . Sickle Cell Pain Crisis   (Consider location/radiation/quality/duration/timing/severity/associated sxs/prior Treatment) HPI Comments: Pt comes in with cc of neck pain. He has hx of sickle cell anemia and right sided IJ thrombosis, probably from port a cath, on lovenox daily and coumadin. He states that he is here NOT for sickle cell pain, but with neck pain. His pain is similar to the pain he had with the diagnosis, it is on the right side, and constant. The pain has no specific aggravating or relieving factors, and he has no dysphagia, respiratory distress.   Patient is a 37 y.o. male presenting with sickle cell pain. The history is provided by the patient.  Sickle Cell Pain Crisis Associated symptoms: no chest pain, no cough and no shortness of breath     Past Medical History  Diagnosis Date  . Sickle cell anemia   . Asthma   . 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 Heart murmur    Past Surgical History  Procedure Laterality Date  . Cholecystectomy    . 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 Sickle cell anemia Mother   . Sickle cell anemia Son    History  Substance Use Topics  . Smoking status: Current Every Day Smoker -- 1.00 packs/day for 5 years    Types: Cigarettes  . Smokeless tobacco: Never Used  . Alcohol Use: No    Review of Systems  Constitutional: Negative for activity change and appetite change.  HENT: Positive for neck pain. Negative for neck stiffness.   Respiratory: Negative for cough and shortness of breath.   Cardiovascular: Negative for chest pain.  Gastrointestinal: Negative for abdominal pain.  Genitourinary: Negative for  dysuria.    Allergies  Morphine and related; Adhesive; and Latex  Home Medications   Current Outpatient Rx  Name  Route  Sig  Dispense  Refill  . albuterol (PROVENTIL HFA;VENTOLIN HFA) 108 (90 BASE) MCG/ACT inhaler   Inhalation   Inhale 2 puffs into the lungs every 6 (six) hours as needed for wheezing.   2 Inhaler   4   . deferasirox (EXJADE) 500 MG disintegrating tablet   Oral   Take 4 tablets (2,000 mg total) by mouth daily.   120 tablet   6   . diphenhydrAMINE (BENADRYL) 25 mg capsule   Oral   Take 25 mg by mouth every 6 (six) hours as needed. Itching.         . folic acid (FOLVITE) 1 MG tablet      Take 2 pills a day.   180 tablet   3   . HYDROmorphone (DILAUDID) 8 MG tablet   Oral   Take 1 tablet (8 mg total) by mouth every 6 (six) hours as needed. Please fill on 5/29   120 tablet   0   . HYDROmorphone HCl 16 MG T24A   Oral   Take 1 tablet (16 mg total) by mouth daily. Please fill on 5/29.   30 tablet   0   . hydroxyurea (HYDREA) 500 MG capsule   Oral   Take 1 capsule (500 mg total) by mouth  3 (three) times daily.   90 capsule   1   . promethazine (PHENERGAN) 25 MG tablet   Oral   Take 25 mg by mouth every 6 (six) hours as needed for nausea.         . temazepam (RESTORIL) 15 MG capsule   Oral   Take 1 capsule (15 mg total) by mouth at bedtime as needed for sleep.   30 capsule   2   . warfarin (COUMADIN) 4 MG tablet   Oral   Take 4 mg by mouth daily.         Marland Kitchen enoxaparin (LOVENOX) 60 MG/0.6ML injection   Subcutaneous   Inject 0.9 mLs (90 mg total) into the skin daily.   2 Syringe   0   . oxyCODONE-acetaminophen (PERCOCET/ROXICET) 5-325 MG per tablet   Oral   Take 2 tablets by mouth every 6 (six) hours as needed for pain.   15 tablet   0    BP 131/59  Pulse 102  Temp(Src) 99.6 F (37.6 C) (Oral)  Ht 5\' 5"  (1.651 m)  Wt 131 lb (59.421 kg)  BMI 21.8 kg/m2  SpO2 91% Physical Exam  Nursing note and vitals  reviewed. Constitutional: He is oriented to person, place, and time. He appears well-developed.  HENT:  Head: Normocephalic and atraumatic.  Eyes: Conjunctivae and EOM are normal. Pupils are equal, round, and reactive to light.  Neck: Normal range of motion. Neck supple. No tracheal deviation present.  Cardiovascular: Normal rate and regular rhythm.   Pulmonary/Chest: Effort normal and breath sounds normal. No stridor. No respiratory distress. He has no wheezes.  Abdominal: Soft. Bowel sounds are normal. He exhibits no distension. There is no tenderness. There is no rebound and no guarding.  Neurological: He is alert and oriented to person, place, and time.  Skin: Skin is warm.    ED Course   Procedures (including critical care time)  Labs Reviewed  CBC WITH DIFFERENTIAL - Abnormal; Notable for the following:    WBC 17.5 (*)    RBC 2.40 (*)    Hemoglobin 7.7 (*)    HCT 21.1 (*)    MCHC 36.5 (*)    RDW 20.2 (*)    Platelets 462 (*)    Neutro Abs 12.9 (*)    Monocytes Absolute 2.1 (*)    All other components within normal limits  BASIC METABOLIC PANEL - Abnormal; Notable for the following:    Glucose, Bld 117 (*)    All other components within normal limits  RETICULOCYTES - Abnormal; Notable for the following:    Retic Ct Pct 16.5 (*)    RBC. 2.40 (*)    Retic Count, Manual 396.0 (*)    All other components within normal limits  PROTIME-INR - Abnormal; Notable for the following:    Prothrombin Time 32.3 (*)    INR 3.29 (*)    All other components within normal limits  TROPONIN I  URINALYSIS, ROUTINE W REFLEX MICROSCOPIC   No results found. 1. Sickle cell anemia with pain   2. Neck pain   3. DVT (deep venous thrombosis), unspecified laterality     MDM  Pt comes in with cc of neck pain.  Vitals are stable, no fevers, no systemic complains consistent with infection.  He has a neck clot, on lovenox and coumadin (not sure why both, but he states he is on both, and  needs ran out of lovenox today, and wants prescription to get him to  Monday). He has no airway issues, and swallowing well.  Exam not showing any crepitus, fluctuance - no concerns for deep infection of the throat.  He is not here for sickle cell pain, he specifically mentioned that to me -and although his retic and ldh are elevated, we are going to treat the patient, and not the number in this case.  Pt has 2 CT scans, 6/29 - was diagnostic, and 7/4 - which showed improvement of the clot. His INR is therapeutic, and he is taking lovenox. I saw the Vascular Surgery note, he is not a candidate for surgery. There is no signs of edema, unilateral facial swelling etc. - and so the result of CT scan is not going to change the dispo.  He has leukocytosis, but again no signs for infection - i have requested patient to come to the ER tomorrow if sx are getting worse.  Derwood Kaplan, MD 01/20/13 0000

## 2013-01-20 ENCOUNTER — Telehealth (HOSPITAL_COMMUNITY): Payer: Self-pay | Admitting: Emergency Medicine

## 2013-02-14 NOTE — Telephone Encounter (Signed)
Dismissal letters from [facility] and [employer], Security notification, authorizations for disclosure and fee scales returned as undeliverable, return to sender, unclaimed and unable to forward.   Letter placed in another envelope and mailed by 1st class mail with does not require a signature. rmf 02/14/2013.

## 2013-03-11 NOTE — Progress Notes (Signed)
This encounter was created in error - please disregard. This encounter was created in error - please disregard. This encounter was created in error - please disregard. 

## 2013-03-24 ENCOUNTER — Encounter (HOSPITAL_COMMUNITY): Payer: Self-pay | Admitting: Emergency Medicine

## 2013-03-24 ENCOUNTER — Emergency Department (HOSPITAL_COMMUNITY)
Admission: EM | Admit: 2013-03-24 | Discharge: 2013-03-24 | Disposition: A | Discharge status: Home / Self Care | Payer: Medicaid Other | Attending: Emergency Medicine | Admitting: Emergency Medicine

## 2013-03-24 DIAGNOSIS — Z91040 Latex allergy status: Secondary | ICD-10-CM | POA: Insufficient documentation

## 2013-03-24 DIAGNOSIS — D5700 Hb-SS disease with crisis, unspecified: Secondary | ICD-10-CM

## 2013-03-24 DIAGNOSIS — Z7901 Long term (current) use of anticoagulants: Secondary | ICD-10-CM | POA: Insufficient documentation

## 2013-03-24 DIAGNOSIS — R011 Cardiac murmur, unspecified: Secondary | ICD-10-CM | POA: Insufficient documentation

## 2013-03-24 DIAGNOSIS — F17200 Nicotine dependence, unspecified, uncomplicated: Secondary | ICD-10-CM | POA: Insufficient documentation

## 2013-03-24 DIAGNOSIS — D571 Sickle-cell disease without crisis: Secondary | ICD-10-CM

## 2013-03-24 DIAGNOSIS — J45909 Unspecified asthma, uncomplicated: Secondary | ICD-10-CM | POA: Insufficient documentation

## 2013-03-24 DIAGNOSIS — Z79899 Other long term (current) drug therapy: Secondary | ICD-10-CM | POA: Insufficient documentation

## 2013-03-24 LAB — RETICULOCYTES
RBC.: 2.44 MIL/uL — ABNORMAL LOW (ref 4.22–5.81)
Retic Ct Pct: 12.9 % — ABNORMAL HIGH (ref 0.4–3.1)

## 2013-03-24 LAB — CBC WITH DIFFERENTIAL/PLATELET
Eosinophils Relative: 4 % (ref 0–5)
HCT: 21.1 % — ABNORMAL LOW (ref 39.0–52.0)
Lymphs Abs: 5 10*3/uL — ABNORMAL HIGH (ref 0.7–4.0)
MCH: 32 pg (ref 26.0–34.0)
MCV: 86.5 fL (ref 78.0–100.0)
Monocytes Absolute: 1.4 10*3/uL — ABNORMAL HIGH (ref 0.1–1.0)
Neutro Abs: 7.1 10*3/uL (ref 1.7–7.7)
Platelets: 538 10*3/uL — ABNORMAL HIGH (ref 150–400)
RBC: 2.44 MIL/uL — ABNORMAL LOW (ref 4.22–5.81)
RDW: 20.4 % — ABNORMAL HIGH (ref 11.5–15.5)

## 2013-03-24 LAB — BASIC METABOLIC PANEL
BUN: 6 mg/dL (ref 6–23)
Chloride: 104 mEq/L (ref 96–112)
Creatinine, Ser: 0.6 mg/dL (ref 0.50–1.35)
GFR calc Af Amer: >90 mL/min (ref >90)
Glucose, Bld: 138 mg/dL — ABNORMAL HIGH (ref 70–99)

## 2013-03-24 MED ORDER — DIPHENHYDRAMINE HCL 50 MG/ML IJ SOLN
25.0000 mg | Freq: Once | INTRAMUSCULAR | Status: AC
  Administered 2013-03-24: 25 mg via INTRAVENOUS
  Filled 2013-03-24: qty 1

## 2013-03-24 MED ORDER — ONDANSETRON HCL 4 MG/2ML IJ SOLN
4.0000 mg | INTRAMUSCULAR | Status: AC
  Administered 2013-03-24: 4 mg via INTRAVENOUS
  Filled 2013-03-24: qty 2

## 2013-03-24 MED ORDER — SODIUM CHLORIDE 0.9 % IV BOLUS (SEPSIS)
1000.0000 mL | Freq: Once | INTRAVENOUS | Status: AC
  Administered 2013-03-24: 1000 mL via INTRAVENOUS

## 2013-03-24 MED ORDER — HYDROMORPHONE HCL 8 MG PO TABS: 8.0000 mg | ORAL_TABLET | Freq: Four times a day (QID) | ORAL | Status: DC | PRN

## 2013-03-24 MED ORDER — HYDROMORPHONE HCL PF 2 MG/ML IJ SOLN
2.0000 mg | Freq: Once | INTRAMUSCULAR | Status: AC
  Administered 2013-03-24: 2 mg via INTRAVENOUS
  Filled 2013-03-24: qty 1

## 2013-03-24 MED ORDER — HEPARIN SOD (PORK) LOCK FLUSH 100 UNIT/ML IV SOLN
500.0000 [IU] | Freq: Once | INTRAVENOUS | Status: AC
  Administered 2013-03-24: 500 [IU]
  Filled 2013-03-24: qty 5

## 2013-03-24 NOTE — ED Notes (Signed)
Pt from home c/o sickle cell pain in lower back and legs. Pt last crisis was last month in which he was not seen for.

## 2013-03-24 NOTE — ED Notes (Signed)
He states he has had low back pain x 2 days; which he recognizes as his typical sickle-cell pain.  He is in no distress, but is restless and sits up in a chair for his position of comfort.  He denies fever, nor any other sign of recent illness.

## 2013-03-24 NOTE — ED Provider Notes (Signed)
CSN: [id number]     Arrival date & time 03/24/13  1030 History   First MD Initiated Contact with Patient 03/24/13 1034     Chief Complaint  Patient presents with  . Sickle Cell Pain Crisis   (Consider location/radiation/quality/duration/timing/severity/associated sxs/prior Treatment) HPI Comments: Patient is a 37 year old male with a history of sickle cell presents for myalgias with onset 2 days ago. Patient states that he has been experiencing pain in his back and lower legs consistent with his past sickle cell crises. Patient states pain is when worsening since onset without any alleviating factors. He states he has been taking Dilaudid pills at home without relief. Patient denies associated fever, cough, chest pain or shortness of breath, abdominal pain, nausea or vomiting, numbness/tingling, weakness, and an inability to ambulate. Patient denies being followed currently by a hematologist for sickle cell center as he is in the process of moving to [place], [place].  Patient is a 37 y.o. male presenting with sickle cell pain. The history is provided by the patient. No language interpreter was used.  Sickle Cell Pain Crisis Associated symptoms: no chest pain, no fever, no nausea, no shortness of breath and no vomiting     Past Medical History  Diagnosis Date  . Sickle cell anemia   . Asthma   . 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 Heart murmur    Past Surgical History  Procedure Laterality Date  . Cholecystectomy    . 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 Sickle cell anemia Mother   . Sickle cell anemia Son    History  Substance Use Topics  . Smoking status: Current Every Day Smoker -- 0.50 packs/day for 5 years    Types: Cigarettes  . Smokeless tobacco: Never Used  . Alcohol Use: No    Review of Systems   Constitutional: Negative for fever.  Respiratory: Negative for shortness of breath.   Cardiovascular: Negative for chest pain.  Gastrointestinal: Negative for nausea and vomiting.  Musculoskeletal: Positive for back pain and myalgias (bilateral lower extremities).  Neurological: Negative for syncope and numbness.  All other systems reviewed and are negative.    Allergies  Morphine and related; Adhesive; and Latex  Home Medications   Current Outpatient Rx  Name  Route  Sig  Dispense  Refill  . albuterol (PROVENTIL HFA;VENTOLIN HFA) 108 (90 BASE) MCG/ACT inhaler   Inhalation   Inhale 2 puffs into the lungs every 6 (six) hours as needed for wheezing.   2 Inhaler   4   . deferasirox (EXJADE) 500 MG disintegrating tablet   Oral   Take 4 tablets (2,000 mg total) by mouth daily.   120 tablet   6   . diphenhydrAMINE (BENADRYL) 25 mg capsule   Oral   Take 25 mg by mouth every 6 (six) hours as needed. Itching.         . folic acid (FOLVITE) 1 MG tablet      Take 2 pills a day.   180 tablet   3   . HYDROmorphone HCl 16 MG T24A   Oral   Take 1 tablet (16 mg total) by mouth daily. Please fill on 5/29.   30 tablet   0   . hydroxyurea (HYDREA) 500 MG capsule   Oral   Take 1 capsule (500 mg  total) by mouth 3 (three) times daily.   90 capsule   1   . promethazine (PHENERGAN) 25 MG tablet   Oral   Take 25 mg by mouth every 6 (six) hours as needed for nausea.         . temazepam (RESTORIL) 15 MG capsule   Oral   Take 1 capsule (15 mg total) by mouth at bedtime as needed for sleep.   30 capsule   2   . warfarin (COUMADIN) 6 MG tablet   Oral   Take 6 mg by mouth daily.         Marland Kitchen HYDROmorphone (DILAUDID) 8 MG tablet   Oral   Take 1 tablet (8 mg total) by mouth every 6 (six) hours as needed.   5 tablet   0    BP 109/44  Pulse 82  Temp(Src) 98 F (36.7 C) (Oral)  Resp 16  Ht 5\' 5"  (1.651 m)  Wt 130 lb (58.968 kg)  BMI 21.63 kg/m2  SpO2 92%  Physical  Exam  Nursing note and vitals reviewed. Constitutional: He is oriented to person, place, and time. He appears well-developed and well-nourished. No distress.  Patient appears uncomfortable. He is in no acute distress and nontoxic appearing.  HENT:  Head: Normocephalic and atraumatic.  Eyes: Conjunctivae and EOM are normal. No scleral icterus.  Neck: Normal range of motion. Neck supple.  No neck swelling appreciated  Cardiovascular: Normal rate, regular rhythm and intact distal pulses.   Pulmonary/Chest: Effort normal and breath sounds normal. No respiratory distress. He has no wheezes. He has no rales.  Abdominal: Soft. He exhibits no distension. There is no tenderness.  Musculoskeletal: Normal range of motion.  Neurological: He is alert and oriented to person, place, and time.  Skin: Skin is warm and dry. No rash noted. He is not diaphoretic. No erythema. No pallor.  Psychiatric: He has a normal mood and affect. His behavior is normal.    ED Course  Procedures (including critical care time) Labs Review Labs Reviewed  CBC WITH DIFFERENTIAL - Abnormal; Notable for the following:    WBC 14.2 (*)    RBC 2.44 (*)    Hemoglobin 7.8 (*)    HCT 21.1 (*)    MCHC 37.0 (*)    RDW 20.4 (*)    Platelets 538 (*)    Lymphs Abs 5.0 (*)    Monocytes Absolute 1.4 (*)    All other components within normal limits  BASIC METABOLIC PANEL - Abnormal; Notable for the following:    Glucose, Bld 138 (*)    All other components within normal limits  PROTIME-INR - Abnormal; Notable for the following:    Prothrombin Time 26.5 (*)    INR 2.54 (*)    All other components within normal limits  RETICULOCYTES - Abnormal; Notable for the following:    Retic Ct Pct 12.9 (*)    RBC. 2.44 (*)    Retic Count, Manual 314.8 (*)    All other components within normal limits   Imaging Review No results found.  EKG Interpretation   None       MDM   1. Sickle cell pain crisis   2. Hemoglobin S-S disease    3. Sickle cell crisis   4. Iron overload    37 year old male with a history of sickle cell anemia who presents for pain secondary to sickle cell crisis. Patient is a well and nontoxic appearing, hemodynamically stable and afebrile. He denies any  chest pain or shortness of breath; no symptoms or exam findings concerning for ACS. Physical exam unremarkable, the patient appears to be uncomfortable. Labs significant for leukocytosis of 14.2; this appears to be around patient's baseline. H/H stable and consistent with priors. PT/INR therapeutic and reticulocytes 314, improved from when last seen 2 months ago.  Patient's symptoms tx with 2mg  Dilaudid x 3 as well as Zofran, benadryl and IVF with significant improvement. Patient states he now feels comfortable managing his sickle cell pain at home. Requesting Dilaudid pills as cannot get in touch with his PCP until Monday for and Rx. Have discussed this with Dr. Anitra Lauth who is comfortable prescribing this for the patient's pain control, but only for 24 hours. Will give Rx for 24 hour supply and have instructed patient to contact his PCP on Monday for additional narcotics if needed. Patient verbalizes understanding.  Patient afebrile, hemodynamically stable, and appropriate for d/c with PCP follow up as needed. Return precautions discussed and patient agreeable to plan with no unaddressed concerns.   Filed Vitals:   03/24/13 1350  BP: 109/44  Pulse:   Temp: 98 F (36.7 C)  Resp: [address], [place] 03/28/13 1232

## 2013-03-26 ENCOUNTER — Inpatient Hospital Stay (HOSPITAL_COMMUNITY)
Admission: EM | Admit: 2013-03-26 | Discharge: 2013-03-30 | DRG: 314 | Disposition: A | Discharge status: Home / Self Care | Payer: Medicaid Other | Attending: Internal Medicine | Admitting: Internal Medicine

## 2013-03-26 ENCOUNTER — Encounter (HOSPITAL_COMMUNITY): Payer: Self-pay | Admitting: Emergency Medicine

## 2013-03-26 ENCOUNTER — Emergency Department (HOSPITAL_COMMUNITY): Payer: Medicaid Other

## 2013-03-26 DIAGNOSIS — D72829 Elevated white blood cell count, unspecified: Secondary | ICD-10-CM | POA: Diagnosis present

## 2013-03-26 DIAGNOSIS — D571 Sickle-cell disease without crisis: Secondary | ICD-10-CM

## 2013-03-26 DIAGNOSIS — Z95828 Presence of other vascular implants and grafts: Secondary | ICD-10-CM

## 2013-03-26 DIAGNOSIS — I82C19 Acute embolism and thrombosis of unspecified internal jugular vein: Secondary | ICD-10-CM

## 2013-03-26 DIAGNOSIS — D5700 Hb-SS disease with crisis, unspecified: Secondary | ICD-10-CM | POA: Diagnosis present

## 2013-03-26 DIAGNOSIS — T80219D Unspecified infection due to central venous catheter, subsequent encounter: Secondary | ICD-10-CM

## 2013-03-26 DIAGNOSIS — F17200 Nicotine dependence, unspecified, uncomplicated: Secondary | ICD-10-CM | POA: Diagnosis present

## 2013-03-26 DIAGNOSIS — I82C29 Chronic embolism and thrombosis of unspecified internal jugular vein: Secondary | ICD-10-CM | POA: Diagnosis present

## 2013-03-26 DIAGNOSIS — T80219S Unspecified infection due to central venous catheter, sequela: Secondary | ICD-10-CM

## 2013-03-26 DIAGNOSIS — Z9089 Acquired absence of other organs: Secondary | ICD-10-CM

## 2013-03-26 DIAGNOSIS — T80219A Unspecified infection due to central venous catheter, initial encounter: Secondary | ICD-10-CM

## 2013-03-26 DIAGNOSIS — Z7901 Long term (current) use of anticoagulants: Secondary | ICD-10-CM

## 2013-03-26 DIAGNOSIS — T80218A Other infection due to central venous catheter, initial encounter: Principal | ICD-10-CM | POA: Diagnosis present

## 2013-03-26 DIAGNOSIS — R52 Pain, unspecified: Secondary | ICD-10-CM | POA: Diagnosis present

## 2013-03-26 DIAGNOSIS — Y849 Medical procedure, unspecified as the cause of abnormal reaction of the patient, or of later complication, without mention of misadventure at the time of the procedure: Secondary | ICD-10-CM | POA: Diagnosis present

## 2013-03-26 DIAGNOSIS — Z832 Family history of diseases of the blood and blood-forming organs and certain disorders involving the immune mechanism: Secondary | ICD-10-CM

## 2013-03-26 DIAGNOSIS — I82C11 Acute embolism and thrombosis of right internal jugular vein: Secondary | ICD-10-CM

## 2013-03-26 DIAGNOSIS — F603 Borderline personality disorder: Secondary | ICD-10-CM | POA: Diagnosis present

## 2013-03-26 DIAGNOSIS — R509 Fever, unspecified: Secondary | ICD-10-CM | POA: Diagnosis present

## 2013-03-26 MED ORDER — ONDANSETRON HCL 4 MG PO TABS
4.0000 mg | ORAL_TABLET | Freq: Once | ORAL | Status: AC
  Administered 2013-03-26: 4 mg via ORAL
  Filled 2013-03-26: qty 1

## 2013-03-26 MED ORDER — HYDROMORPHONE HCL PF 2 MG/ML IJ SOLN
2.0000 mg | Freq: Once | INTRAMUSCULAR | Status: AC
  Administered 2013-03-26: 2 mg via INTRAMUSCULAR
  Filled 2013-03-26: qty 1

## 2013-03-26 NOTE — ED Notes (Signed)
Did not attempt to access Port d/t tenderness and swelling in site.

## 2013-03-26 NOTE — ED Notes (Signed)
Pt states seen a few days ago.  Pt here for sickle cell pain but also c/o pain in port site from port access done 3 days ago.

## 2013-03-26 NOTE — ED Provider Notes (Signed)
CSN: [id number]     Arrival date & time 03/26/13  2027 History  This chart was scribed for Christopher Pel, PA-C, working with Christopher Mackie, MD, by Ardelia Mems ED Scribe. This patient was seen in room WA08/WA08 and the patient's care was started at 11:48 PM.  Chief Complaint  Patient presents with  . Sickle Cell Pain Crisis    The history is provided by the patient. No language interpreter was used.    HPI Comments: Christopher Warren is a 37 y.o. male with a history of sickle cell anemia and hemoglobin S-S disease who presents to the Emergency Department complaining of generalized "aching" pain for the past 2 days. He believes he is experiencing a sickle cell pain crisis. He also complains of pain in his port site from port access done 2 days ago and the next day had temperature of 102.6. He also describes not feeling well and having significant S-S pain. He expresses concern that the area may be infected. His cath was originally placed by [facility] in [facility]. He is on coumadin for previous blood clot in his jugular. He says that he had his PT/INR checked just a few days ago and it was too high that they had him skip 1 dose of coumadin.   Past Medical History  Diagnosis Date  . Sickle cell anemia   . Asthma   . 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 Heart murmur    Past Surgical History  Procedure Laterality Date  . Cholecystectomy    . 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 Sickle cell anemia Mother   . Sickle cell anemia Son    History  Substance Use Topics  . Smoking status: Current Every Day Smoker -- 0.50 packs/day for 5 years    Types: Cigarettes  . Smokeless tobacco: Never Used  . Alcohol Use: No    Review of Systems  All other systems reviewed and are negative.   Allergies  Morphine and related;  Adhesive; and Latex  Home Medications   Current Outpatient Rx  Name  Route  Sig  Dispense  Refill  . EXPIRED: albuterol (PROVENTIL HFA;VENTOLIN HFA) 108 (90 BASE) MCG/ACT inhaler   Inhalation   Inhale 2 puffs into the lungs every 6 (six) hours as needed for wheezing.   2 Inhaler   4   . deferasirox (EXJADE) 500 MG disintegrating tablet   Oral   Take 4 tablets (2,000 mg total) by mouth daily.   120 tablet   6   . diphenhydrAMINE (BENADRYL) 25 mg capsule   Oral   Take 25 mg by mouth every 6 (six) hours as needed. Itching.         . folic acid (FOLVITE) 1 MG tablet      Take 2 pills a day.   180 tablet   3   . HYDROmorphone (DILAUDID) 8 MG tablet   Oral   Take 1 tablet (8 mg total) by mouth every 6 (six) hours as needed.   5 tablet   0   . HYDROmorphone HCl 16 MG T24A   Oral   Take 1 tablet (16 mg total) by mouth daily. Please fill on 5/29.   30 tablet   0   . hydroxyurea (HYDREA) 500 MG capsule   Oral   Take  1 capsule (500 mg total) by mouth 3 (three) times daily.   90 capsule   1   . promethazine (PHENERGAN) 25 MG tablet   Oral   Take 25 mg by mouth every 6 (six) hours as needed for nausea.         . temazepam (RESTORIL) 15 MG capsule   Oral   Take 1 capsule (15 mg total) by mouth at bedtime as needed for sleep.   30 capsule   2   . warfarin (COUMADIN) 6 MG tablet   Oral   Take 6 mg by mouth daily.          Triage Vitals: BP 111/57  Pulse 91  Temp(Src) 98.4 F (36.9 C) (Oral)  Resp 18  SpO2 95%  Physical Exam  Nursing note and vitals reviewed. Constitutional: He is oriented to person, place, and time. He appears well-developed and well-nourished. He appears distressed.  HENT:  Head: Normocephalic and atraumatic.  Eyes: EOM are normal.  Neck: Neck supple. No tracheal deviation present.  Cardiovascular: Normal rate.   Pulmonary/Chest: Effort normal. No respiratory distress.    Musculoskeletal: Normal range of motion.  Neurological: He  is alert and oriented to person, place, and time.  Skin: Skin is warm and dry.  Psychiatric: He has a normal mood and affect. His behavior is normal.    ED Course  Procedures (including critical care time)  DIAGNOSTIC STUDIES: Oxygen Saturation is 95% on RA, adequate by my interpretation.    COORDINATION OF CARE: 11:55 PM- Pt advised of plan for treatment and pt agrees.  Medications  sodium chloride 0.9 % bolus 1,000 mL (not administered)  HYDROmorphone (DILAUDID) injection 1 mg (not administered)  0.9 %  sodium chloride infusion (not administered)  vancomycin (VANCOCIN) IVPB 1000 mg/200 mL premix (not administered)  HYDROmorphone (DILAUDID) injection 2 mg (2 mg Intramuscular Given 03/26/13 2315)  ondansetron (ZOFRAN) tablet 4 mg (4 mg Oral Given 03/26/13 2315)  HYDROmorphone (DILAUDID) injection 2 mg (2 mg Intravenous Given 03/27/13 0143)  diphenhydrAMINE (BENADRYL) injection 25 mg (25 mg Intravenous Given 03/27/13 0143)  promethazine (PHENERGAN) injection 25 mg (25 mg Intravenous Given 03/27/13 0143)   Labs Review Labs Reviewed  CBC WITH DIFFERENTIAL - Abnormal; Notable for the following:    WBC 19.2 (*)    RBC 2.30 (*)    Hemoglobin 7.3 (*)    HCT 20.1 (*)    MCHC 36.3 (*)    RDW 20.5 (*)    Platelets 522 (*)    Neutro Abs 11.3 (*)    Lymphs Abs 5.0 (*)    Monocytes Absolute 2.1 (*)    Basophils Absolute 0.2 (*)    All other components within normal limits  COMPREHENSIVE METABOLIC PANEL - Abnormal; Notable for the following:    AST 54 (*)    ALT 57 (*)    Alkaline Phosphatase 121 (*)    Total Bilirubin 3.1 (*)    All other components within normal limits  RETICULOCYTES - Abnormal; Notable for the following:    Retic Ct Pct 16.0 (*)    RBC. 2.30 (*)    Retic Count, Manual 368.0 (*)    All other components within normal limits  CULTURE, BLOOD (ROUTINE X 2)  CULTURE, BLOOD (ROUTINE X 2)  LACTIC ACID, PLASMA  PROTIME-INR   Imaging Review Dg Chest 2  View  03/27/2013   CLINICAL DATA:  Sickle cell disease. Port-A-Cath pain for 2 days.  EXAM: CHEST  2 VIEW  COMPARISON:  12/11/2012.  FINDINGS: Port-A-Cath appears unchanged from prior chest x-ray with right supraclavicular approach. Tip lies proximal right atrium. Chronic mild interstitial thickening of the lungs without active infiltrates, effusion, or pneumothorax. Enlarged heart. Sickle cell changes of the bones.  IMPRESSION: No active cardiopulmonary disease. Unremarkable appearing right Port-A-Cath.   Electronically Signed   By: Davonna Belling M.D.   On: 03/27/2013 00:02    MDM   1. Sickle cell pain crisis   2. Port-a-cath in place    Pt concerned about fever and Port-A- Cath swelling. Will admit for pain control and for further evaluation of his Port-A-Cath. Dr. Conley Rolls has agreed to see patient.  Dr. Conley Rolls has requested blood cultures and IV Vancomycin. He will put in orders.  I personally performed the services described in this documentation, which was scribed in my presence. The recorded information has been reviewed and is accurate.   Dorthula Matas, PA-C 03/27/13 [phone]

## 2013-03-27 DIAGNOSIS — D5700 Hb-SS disease with crisis, unspecified: Secondary | ICD-10-CM

## 2013-03-27 DIAGNOSIS — R509 Fever, unspecified: Secondary | ICD-10-CM

## 2013-03-27 DIAGNOSIS — T80218A Other infection due to central venous catheter, initial encounter: Principal | ICD-10-CM

## 2013-03-27 DIAGNOSIS — I82C19 Acute embolism and thrombosis of unspecified internal jugular vein: Secondary | ICD-10-CM

## 2013-03-27 DIAGNOSIS — D72829 Elevated white blood cell count, unspecified: Secondary | ICD-10-CM

## 2013-03-27 DIAGNOSIS — D571 Sickle-cell disease without crisis: Secondary | ICD-10-CM | POA: Diagnosis present

## 2013-03-27 DIAGNOSIS — T80219A Unspecified infection due to central venous catheter, initial encounter: Secondary | ICD-10-CM

## 2013-03-27 LAB — COMPREHENSIVE METABOLIC PANEL
Albumin: 3.6 g/dL (ref 3.5–5.2)
BUN: 7 mg/dL (ref 6–23)
CO2: 25 mEq/L (ref 19–32)
Calcium: 9.2 mg/dL (ref 8.4–10.5)
Chloride: 104 mEq/L (ref 96–112)
Creatinine, Ser: 0.53 mg/dL (ref 0.50–1.35)
GFR calc Af Amer: >90 mL/min (ref >90)
GFR calc non Af Amer: >90 mL/min (ref >90)
Potassium: 3.7 mEq/L (ref 3.5–5.1)
Total Bilirubin: 3.1 mg/dL — ABNORMAL HIGH (ref 0.3–1.2)

## 2013-03-27 LAB — CBC WITH DIFFERENTIAL/PLATELET
Basophils Absolute: 0.2 10*3/uL — ABNORMAL HIGH (ref 0.0–0.1)
Basophils Relative: 1 % (ref 0–1)
Basophils Relative: 1 % (ref 0–1)
Eosinophils Absolute: 0.6 10*3/uL (ref 0.0–0.7)
Eosinophils Absolute: 0.6 10*3/uL (ref 0.0–0.7)
Eosinophils Relative: 3 % (ref 0–5)
Eosinophils Relative: 3 % (ref 0–5)
HCT: 20.1 % — ABNORMAL LOW (ref 39.0–52.0)
Lymphs Abs: 5 10*3/uL — ABNORMAL HIGH (ref 0.7–4.0)
MCH: 31.7 pg (ref 26.0–34.0)
MCH: 31.5 pg (ref 26.0–34.0)
MCHC: 36.3 g/dL — ABNORMAL HIGH (ref 30.0–36.0)
MCHC: 36.3 g/dL — ABNORMAL HIGH (ref 30.0–36.0)
MCV: 87.4 fL (ref 78.0–100.0)
Monocytes Absolute: 2.1 10*3/uL — ABNORMAL HIGH (ref 0.1–1.0)
Monocytes Absolute: 1.9 10*3/uL — ABNORMAL HIGH (ref 0.1–1.0)
Monocytes Relative: 10 % (ref 3–12)
Neutrophils Relative %: 63 % (ref 43–77)
Platelets: 522 10*3/uL — ABNORMAL HIGH (ref 150–400)
Platelets: 526 10*3/uL — ABNORMAL HIGH (ref 150–400)
RBC: 2.3 MIL/uL — ABNORMAL LOW (ref 4.22–5.81)
RBC: 2.22 MIL/uL — ABNORMAL LOW (ref 4.22–5.81)
RDW: 20.9 % — ABNORMAL HIGH (ref 11.5–15.5)

## 2013-03-27 LAB — RETICULOCYTES
RBC.: 2.3 MIL/uL — ABNORMAL LOW (ref 4.22–5.81)
Retic Ct Pct: 16 % — ABNORMAL HIGH (ref 0.4–3.1)

## 2013-03-27 LAB — PROTIME-INR: INR: 3.08 — ABNORMAL HIGH (ref 0.00–1.49)

## 2013-03-27 MED ORDER — SODIUM CHLORIDE 0.9 % IV SOLN
INTRAVENOUS | Status: DC
  Administered 2013-03-27 – 2013-03-30 (×6): via INTRAVENOUS

## 2013-03-27 MED ORDER — DEFERASIROX 500 MG PO TBSO: 2000.0000 mg | ORAL_TABLET | Freq: Every day | ORAL | Status: DC

## 2013-03-27 MED ORDER — DIPHENHYDRAMINE HCL 50 MG/ML IJ SOLN
25.0000 mg | Freq: Once | INTRAMUSCULAR | Status: AC
  Administered 2013-03-27: 25 mg via INTRAVENOUS
  Filled 2013-03-27: qty 1

## 2013-03-27 MED ORDER — DIPHENHYDRAMINE HCL 50 MG/ML IJ SOLN
25.0000 mg | Freq: Four times a day (QID) | INTRAMUSCULAR | Status: DC | PRN
  Administered 2013-03-27 – 2013-03-30 (×10): 25 mg via INTRAVENOUS
  Filled 2013-03-27 (×11): qty 1

## 2013-03-27 MED ORDER — SODIUM CHLORIDE 0.9 % IV BOLUS (SEPSIS)
1000.0000 mL | Freq: Once | INTRAVENOUS | Status: AC
  Administered 2013-03-27: 1000 mL via INTRAVENOUS

## 2013-03-27 MED ORDER — HYDROMORPHONE HCL PF 2 MG/ML IJ SOLN: 3.0000 mg | INTRAMUSCULAR | Status: DC | PRN

## 2013-03-27 MED ORDER — PHYTONADIONE 5 MG PO TABS
2.5000 mg | ORAL_TABLET | Freq: Once | ORAL | Status: AC
  Administered 2013-03-27: 2.5 mg via ORAL
  Filled 2013-03-27: qty 1

## 2013-03-27 MED ORDER — HYDROMORPHONE HCL PF 1 MG/ML IJ SOLN
1.0000 mg | Freq: Once | INTRAMUSCULAR | Status: AC
  Administered 2013-03-27: 1 mg via INTRAVENOUS
  Filled 2013-03-27: qty 1

## 2013-03-27 MED ORDER — PIPERACILLIN-TAZOBACTAM 3.375 G IVPB 30 MIN
3.3750 g | Freq: Three times a day (TID) | INTRAVENOUS | Status: DC
  Administered 2013-03-27 – 2013-03-30 (×9): 3.375 g via INTRAVENOUS
  Filled 2013-03-27 (×10): qty 50

## 2013-03-27 MED ORDER — ONDANSETRON HCL 4 MG PO TABS: 4.0000 mg | ORAL_TABLET | Freq: Four times a day (QID) | ORAL | Status: DC | PRN

## 2013-03-27 MED ORDER — VANCOMYCIN HCL IN DEXTROSE 1-5 GM/200ML-% IV SOLN
1000.0000 mg | Freq: Once | INTRAVENOUS | Status: AC
  Administered 2013-03-27: 1000 mg via INTRAVENOUS
  Filled 2013-03-27: qty 200

## 2013-03-27 MED ORDER — HYDROMORPHONE HCL 4 MG PO TABS: 8.0000 mg | ORAL_TABLET | Freq: Four times a day (QID) | ORAL | Status: DC | PRN

## 2013-03-27 MED ORDER — HYDROMORPHONE HCL PF 2 MG/ML IJ SOLN
2.0000 mg | INTRAMUSCULAR | Status: DC | PRN
  Administered 2013-03-27 – 2013-03-28 (×13): 2 mg via INTRAVENOUS
  Administered 2013-03-28: 3 mg via INTRAVENOUS
  Administered 2013-03-28 – 2013-03-29 (×3): 2 mg via INTRAVENOUS
  Administered 2013-03-29: 3 mg via INTRAVENOUS
  Administered 2013-03-29 (×5): 2 mg via INTRAVENOUS
  Administered 2013-03-29 (×2): 3 mg via INTRAVENOUS
  Administered 2013-03-30 (×4): 2 mg via INTRAVENOUS
  Filled 2013-03-27 (×7): qty 1
  Filled 2013-03-27: qty 2
  Filled 2013-03-27 (×8): qty 1
  Filled 2013-03-27: qty 2
  Filled 2013-03-27 (×7): qty 1
  Filled 2013-03-27: qty 2
  Filled 2013-03-27: qty 1
  Filled 2013-03-27: qty 2
  Filled 2013-03-27 (×3): qty 1

## 2013-03-27 MED ORDER — DOCUSATE SODIUM 100 MG PO CAPS
100.0000 mg | ORAL_CAPSULE | Freq: Two times a day (BID) | ORAL | Status: DC
  Administered 2013-03-27 – 2013-03-30 (×7): 100 mg via ORAL
  Filled 2013-03-27 (×8): qty 1

## 2013-03-27 MED ORDER — HYDROXYUREA 500 MG PO CAPS
500.0000 mg | ORAL_CAPSULE | Freq: Three times a day (TID) | ORAL | Status: DC
  Administered 2013-03-27 – 2013-03-29 (×3): 500 mg via ORAL
  Filled 2013-03-27 (×12): qty 1

## 2013-03-27 MED ORDER — PIPERACILLIN-TAZOBACTAM 3.375 G IVPB 30 MIN
3.3750 g | INTRAVENOUS | Status: AC
  Administered 2013-03-27: 3.375 g via INTRAVENOUS
  Filled 2013-03-27: qty 50

## 2013-03-27 MED ORDER — VANCOMYCIN HCL IN DEXTROSE 750-5 MG/150ML-% IV SOLN
750.0000 mg | Freq: Three times a day (TID) | INTRAVENOUS | Status: DC
  Administered 2013-03-27 – 2013-03-28 (×3): 750 mg via INTRAVENOUS
  Filled 2013-03-27 (×5): qty 150

## 2013-03-27 MED ORDER — FOLIC ACID 1 MG PO TABS
1.0000 mg | ORAL_TABLET | Freq: Every day | ORAL | Status: DC
  Administered 2013-03-27 – 2013-03-30 (×4): 1 mg via ORAL
  Filled 2013-03-27 (×4): qty 1

## 2013-03-27 MED ORDER — PROMETHAZINE HCL 25 MG/ML IJ SOLN
12.5000 mg | Freq: Four times a day (QID) | INTRAMUSCULAR | Status: DC | PRN
  Administered 2013-03-27 – 2013-03-30 (×10): 25 mg via INTRAVENOUS
  Administered 2013-03-30: 12.5 mg via INTRAVENOUS
  Filled 2013-03-27 (×12): qty 1

## 2013-03-27 MED ORDER — HYDROMORPHONE HCL PF 2 MG/ML IJ SOLN
2.0000 mg | Freq: Once | INTRAMUSCULAR | Status: AC
  Administered 2013-03-27: 2 mg via INTRAVENOUS
  Filled 2013-03-27: qty 1

## 2013-03-27 MED ORDER — HYDROMORPHONE HCL PF 2 MG/ML IJ SOLN
2.0000 mg | INTRAMUSCULAR | Status: DC | PRN
  Administered 2013-03-27 (×2): 2 mg via INTRAVENOUS
  Filled 2013-03-27 (×2): qty 1

## 2013-03-27 MED ORDER — ONDANSETRON HCL 4 MG/2ML IJ SOLN: 4.0000 mg | Freq: Four times a day (QID) | INTRAMUSCULAR | Status: DC | PRN

## 2013-03-27 MED ORDER — DEXTROSE 5 % IV SOLN
2000.0000 mg | Freq: Every day | INTRAVENOUS | Status: DC
  Administered 2013-03-27 – 2013-03-29 (×3): 2000 mg via INTRAVENOUS
  Filled 2013-03-27 (×4): qty 2

## 2013-03-27 MED ORDER — WARFARIN SODIUM 4 MG PO TABS
4.0000 mg | ORAL_TABLET | Freq: Once | ORAL | Status: DC
  Filled 2013-03-27: qty 1

## 2013-03-27 MED ORDER — PROMETHAZINE HCL 25 MG/ML IJ SOLN
25.0000 mg | Freq: Once | INTRAMUSCULAR | Status: AC
  Administered 2013-03-27: 25 mg via INTRAVENOUS
  Filled 2013-03-27: qty 1

## 2013-03-27 MED ORDER — ZOLPIDEM TARTRATE 5 MG PO TABS
5.0000 mg | ORAL_TABLET | Freq: Every evening | ORAL | Status: DC | PRN
  Administered 2013-03-27: 5 mg via ORAL
  Filled 2013-03-27: qty 1

## 2013-03-27 MED ORDER — LORAZEPAM 2 MG/ML IJ SOLN
1.0000 mg | Freq: Every day | INTRAMUSCULAR | Status: DC
  Administered 2013-03-27 – 2013-03-29 (×3): 1 mg via INTRAVENOUS
  Filled 2013-03-27 (×3): qty 1

## 2013-03-27 MED ORDER — WARFARIN - PHARMACIST DOSING INPATIENT: Freq: Every day | Status: DC

## 2013-03-27 NOTE — ED Notes (Signed)
Notified Lab.  Will need phlebotomy stick for remaining labs.
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
## 2013-03-27 NOTE — ED Provider Notes (Signed)
Medical screening examination/treatment/procedure(s) were performed by non-physician practitioner and as supervising physician I was immediately available for consultation/collaboration.  [name], MD 03/27/13 [phone]

## 2013-03-27 NOTE — Progress Notes (Signed)
ANTIBIOTIC CONSULT NOTE - INITIAL  Pharmacy Consult for Vancomycin Indication: Line infection  Allergies  Allergen Reactions  . Morphine And Related Hives    Patient states can take Dilaudid with benadryl  . Adhesive [Tape]   . Latex     Patient Measurements: Height: 5\' 5"  (165.1 cm) Weight: 129 lb (58.514 kg) IBW/kg (Calculated) : 61.5 Adjusted Body Weight:   Vital Signs: Temp: 98.5 F (36.9 C) (10/15 0345) Temp src: Oral (10/15 0345) BP: 110/33 mmHg (10/15 0345) Pulse Rate: 63 (10/15 0345) Intake/Output from previous day:   Intake/Output from this shift:    Labs:  Recent Labs  03/24/13 1110 03/27/13 0100  WBC 14.2* 19.2*  HGB 7.8* 7.3*  PLT 538* 522*  CREATININE 0.60 0.53   Estimated Creatinine Clearance: 107.7 ml/min (by C-G formula based on Cr of 0.53). No results found for this basename: VANCOTROUGH, VANCOPEAK, VANCORANDOM, GENTTROUGH, GENTPEAK, GENTRANDOM, TOBRATROUGH, TOBRAPEAK, TOBRARND, AMIKACINPEAK, AMIKACINTROU, AMIKACIN,  in the last 72 hours   Microbiology: No results found for this or any previous visit (from the past 720 hour(s)).  Medical History: Past Medical History  Diagnosis Date  . Sickle cell anemia   . Asthma   . 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 Heart murmur     Medications:  Anti-infectives   Start     Dose/Rate Route Frequency Ordered Stop   03/27/13 1400  piperacillin-tazobactam (ZOSYN) IVPB 3.375 g     3.375 g 12.5 mL/hr over 240 Minutes Intravenous 3 times per day 03/27/13 0324     03/27/13 1400  vancomycin (VANCOCIN) IVPB 750 mg/150 ml premix     750 mg 150 mL/hr over 60 Minutes Intravenous Every 8 hours 03/27/13 0631     03/27/13 0330  piperacillin-tazobactam (ZOSYN) IVPB 3.375 g     3.375 g 100 mL/hr over 30 Minutes Intravenous NOW 03/27/13 0317 03/27/13 0525   03/27/13 0300  vancomycin (VANCOCIN) IVPB 1000 mg/200 mL premix     1,000 mg 200 mL/hr over 60 Minutes Intravenous  Once 03/27/13 0246        Assessment: Patient with line infection.  First dose of antibiotics already given.  Goal of Therapy:  Vancomycin trough level 15-20 mcg/ml  Plan:  Measure antibiotic drug levels at steady state Follow up culture results vancomycin 750mg  iv q8hr  Darlina Guys, Fathima Bartl Crowford 03/27/2013,6:44 AM

## 2013-03-27 NOTE — H&P (Signed)
[facility] History and Physical  JAHREL BORTHWICK ZOX:[id number] DOB: 1976/05/11    PCP:   [name], MD   Chief Complaint: Sickle cell crisis, fever, and possible porta-cath line infection.   HPI: Christopher Warren is an 37 y.o. 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  He was seen yesterday, and had his port accessed, which developed swelling and pain in this area.  Last night, he had a temp of 102.7.   There has been no chest pain or shortness of breath. Evaluation in the ER included a reticulocyte count of 16 percent,  Hb of 7.8 grams per dL, with normal renal function test. He is afebrile in the ER, maintained stable hemodynamics, and has a leukocytosis with WBC of 19K, In the ER,  IVF and IV pain medication was given, but patient continue to have pain, and hospitalist was asked to admit for sickle cell crisis and possible line infection.  Last hospitalization, he was seen by ID, and was placed on Lizinolid after VAN/ZOSYN, but it was not felt that he had definite septic thromboplebitis.  Rewiew of Systems:  Constitutional: Negative for malaise,  and chills. No significant weight loss or weight gain Eyes: Negative for eye pain, redness and discharge, diplopia, visual changes, or flashes of light. ENMT: Negative for ear pain, hoarseness, nasal congestion, sinus pressure and sore throat. No headaches; tinnitus, drooling, or problem swallowing. Cardiovascular: Negative for chest pain, palpitations, diaphoresis, dyspnea and peripheral edema. ; No orthopnea, PND Respiratory: Negative for cough, hemoptysis, wheezing and stridor. No pleuritic chestpain. Gastrointestinal: Negative for nausea, vomiting, diarrhea, constipation, abdominal pain, melena, blood in stool, hematemesis, jaundice and rectal  bleeding.    Genitourinary: Negative for frequency, dysuria, incontinence,flank pain and hematuria; Musculoskeletal: Negative for swelling and trauma.;  Skin: . Negative for pruritus, rash, abrasions, bruising and skin lesion.; ulcerations Neuro: Negative for headache, lightheadedness and neck stiffness. Negative for weakness, altered level of consciousness , altered mental status, extremity weakness, burning feet, involuntary movement, seizure and syncope.  Psych: negative for anxiety, depression, insomnia, tearfulness, panic attacks, hallucinations, paranoia, suicidal or homicidal ideation     Past Medical History  Diagnosis Date  . Sickle cell anemia   . Asthma   . 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 Heart murmur     Past Surgical History  Procedure Laterality Date  . Cholecystectomy    . 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 Authorizing Provider  albuterol (PROVENTIL HFA;VENTOLIN HFA) 108 (90 BASE) MCG/ACT inhaler Inhale 2 puffs into the lungs every 6 (six) hours as needed for wheezing. 02/14/12 03/24/13  [name], MD  deferasirox (EXJADE) 500 MG disintegrating tablet Take 4 tablets (2,000 mg total) by mouth daily. 12/17/12   Laveda Norman, MD  diphenhydrAMINE (BENADRYL) 25 mg capsule Take 25 mg by mouth every 6 (six) hours as needed. Itching.    Historical Provider, MD  folic acid (FOLVITE) 1 MG tablet Take 2 pills a day. 12/17/12   Laveda Norman, MD  HYDROmorphone (DILAUDID) 8 MG tablet Take 1 tablet (8 mg total) by mouth every 6 (six) hours  as needed. 03/24/13   Antony Madura, PA-C  HYDROmorphone HCl 16 MG T24A Take 1 tablet (16 mg total) by mouth daily. Please fill on 5/29. 12/17/12   Laveda Norman, MD  hydroxyurea (HYDREA) 500 MG capsule Take 1 capsule (500 mg total) by  mouth 3 (three) times daily. 12/17/12   Laveda Norman, MD  promethazine (PHENERGAN) 25 MG tablet Take 25 mg by mouth every 6 (six) hours as needed for nausea.    Historical Provider, MD  temazepam (RESTORIL) 15 MG capsule Take 1 capsule (15 mg total) by mouth at bedtime as needed for sleep. 09/10/12   [name], MD  warfarin (COUMADIN) 6 MG tablet Take 6 mg by mouth daily.    Historical Provider, MD     Allergies:  Allergies  Allergen Reactions  . Morphine And Related Hives    Patient states can take Dilaudid with benadryl  . Adhesive [Tape]   . Latex     Social History:   reports that he has been smoking Cigarettes.  He has a 2.5 pack-year smoking history. He has never used smokeless tobacco. He reports that he does not drink alcohol or use illicit drugs.  Family History: Family History  Problem Relation Age of Onset  . Sickle cell anemia Mother   . Sickle cell anemia Son      Physical Exam: Filed Vitals:   03/26/13 2133 03/27/13 0304  BP: 111/57 93/35  Pulse: 91 64  Temp: 98.4 F (36.9 C) 98.3 F (36.8 C)  TempSrc: Oral Oral  Resp: 18 20  SpO2: 95% 95%   Blood pressure 93/35, pulse 64, temperature 98.3 F (36.8 C), temperature source Oral, resp. rate 20, SpO2 95.00%.  GEN:  Pleasant  patient lying in the stretcher in no acute distress; cooperative with exam. Requesting pain meds constantly. PSYCH:  alert and oriented x4; does not appear anxious or depressed; affect is appropriate. HEENT: Mucous membranes pink and anicteric; PERRLA; EOM intact; no cervical lymphadenopathy nor thyromegaly or carotid bruit; no JVD; There were no stridor. Neck is very supple. Breasts:: Not examined CHEST WALL: Tender to touch over the inferior portion of the porta-cath.  There is some swelling and firmness as well. CHEST: Normal respiration, clear to auscultation bilaterally.  HEART: Regular rate and rhythm.  There are no murmur, rub, or gallops.   BACK: No kyphosis or scoliosis; no  CVA tenderness ABDOMEN: soft and non-tender; no masses, no organomegaly, normal abdominal bowel sounds; no pannus; no intertriginous candida. There is no rebound and no distention. Scar from s/p splenectomy. Rectal Exam: Not done EXTREMITIES: No bone or joint deformity; age-appropriate arthropathy of the hands and knees; no edema; no ulcerations.  There is no calf tenderness. Genitalia: not examined PULSES: 2+ and symmetric SKIN: Normal hydration no rash or ulceration CNS: Cranial nerves 2-12 grossly intact no focal lateralizing neurologic deficit.  Speech is fluent; uvula elevated with phonation, facial symmetry and tongue midline. DTR are normal bilaterally, cerebella exam is intact, barbinski is negative and strengths are equaled bilaterally.  No sensory loss.   Labs on Admission:  Basic Metabolic Panel:  Recent Labs Lab 03/24/13 1110 03/27/13 0100  NA 136 138  K 3.8 3.7  CL 104 104  CO2 25 25  GLUCOSE 138* 99  BUN 6 7  CREATININE 0.60 0.53  CALCIUM 8.9 9.2   Liver Function Tests:  Recent Labs Lab 03/27/13 0100  AST 54*  ALT 57*  ALKPHOS 121*  BILITOT 3.1*  PROT  7.0  ALBUMIN 3.6   No results found for this basename: LIPASE, AMYLASE,  in the last 168 hours CBC:  Recent Labs Lab 03/24/13 1110 03/27/13 0100  WBC 14.2* 19.2*  NEUTROABS 7.1 11.3*  HGB 7.8* 7.3*  HCT 21.1* 20.1*  MCV 86.5 87.4  PLT 538* 522*   CBG: No results found for this basename: GLUCAP,  in the last 168 hours    Radiological Exams on Admission: Dg Chest 2 View  03/27/2013   CLINICAL DATA:  Sickle cell disease. Port-A-Cath pain for 2 days.  EXAM: CHEST  2 VIEW  COMPARISON:  12/11/2012.  FINDINGS: Port-A-Cath appears unchanged from prior chest x-ray with right supraclavicular approach. Tip lies proximal right atrium. Chronic mild interstitial thickening of the lungs without active infiltrates, effusion, or pneumothorax. Enlarged heart. Sickle cell changes of the bones.  IMPRESSION: No active  cardiopulmonary disease. Unremarkable appearing right Port-A-Cath.   Electronically Signed   By: Davonna Belling M.D.   On: 03/27/2013 00:02   Assessment/Plan Present on Admission:  . Internal jugular (IJ) vein thromboembolism, acute . Hemoglobin S-S disease . Fever . Sickle cell crisis  PLAN:  This patient presents with sickle cell crisis and will be admitted for further treatment.  Will treat with IVF, IV pain medications as tolerated, and supplement Oxygen.  He also has pain on the porta-cath site after being access yesterday, with fever to 102.7 at home.  Since he is asplenic, will start him on IV VAN/ZOSYN, after blood cultures done.  Please consult vascular surgery later today for further recommendation.  Patient is stable, full code, and will be admitted to general medical floor. Home meds will be continued including his home narcotics.  Will admit patient to [facility] service.     Other plans as per orders.  Code Status: FULL Unk Lightning, MD. [facility] Pager [phone] 7pm to 7am.  03/27/2013, 3:06 AM

## 2013-03-27 NOTE — Progress Notes (Signed)
Patient ID: Christopher Warren, male   DOB: [birth year], 37 y.o.   MRN: [id number] Request received for port a cath removal on pt with hx of SSC, rt IJ thrombosis, recent fevers, leukocytosis and possible port site infection. Pt currently on coumadin with PT 30.7  INR 3.08. Will need to hold coumadin and await normalization of  PT/INR before port can be removed. Dr. Elisabeth Pigeon made aware of this and she will order Vitamin K. Will cont to monitor .

## 2013-03-27 NOTE — Progress Notes (Signed)
ANTICOAGULATION CONSULT NOTE - Initial Consult  Pharmacy Consult for warfarin Indication: internal jugular thrombosis  Allergies  Allergen Reactions  . Morphine And Related Hives    Patient states can take Dilaudid with benadryl  . Adhesive [Tape]   . Latex     Patient Measurements: Height: 5\' 5"  (165.1 cm) Weight: 129 lb (58.514 kg) IBW/kg (Calculated) : 61.5 Heparin Dosing Weight:   Vital Signs: Temp: 98.5 F (36.9 C) (10/15 0345) Temp src: Oral (10/15 0345) BP: 110/33 mmHg (10/15 0345) Pulse Rate: 63 (10/15 0345)  Labs:  Recent Labs  03/24/13 1110 03/27/13 0100  HGB 7.8* 7.3*  HCT 21.1* 20.1*  PLT 538* 522*  LABPROT 26.5*  --   INR 2.54*  --   CREATININE 0.60 0.53    Estimated Creatinine Clearance: 107.7 ml/min (by C-G formula based on Cr of 0.53).   Medical History: Past Medical History  Diagnosis Date  . Sickle cell anemia   . Asthma   . 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 Heart murmur     Medications:  Prescriptions prior to admission  Medication Sig Dispense Refill  . albuterol (PROVENTIL HFA;VENTOLIN HFA) 108 (90 BASE) MCG/ACT inhaler Inhale 2 puffs into the lungs every 6 (six) hours as needed for wheezing.  2 Inhaler  4  . deferasirox (EXJADE) 500 MG disintegrating tablet Take 4 tablets (2,000 mg total) by mouth daily.  120 tablet  6  . diphenhydrAMINE (BENADRYL) 25 mg capsule Take 25 mg by mouth every 6 (six) hours as needed. Itching.      . folic acid (FOLVITE) 1 MG tablet Take 2 pills a day.  180 tablet  3  . HYDROmorphone (DILAUDID) 8 MG tablet Take 1 tablet (8 mg total) by mouth every 6 (six) hours as needed.  5 tablet  0  . HYDROmorphone HCl 16 MG T24A Take 1 tablet (16 mg total) by mouth daily. Please fill on 5/29.  30 tablet  0  . hydroxyurea (HYDREA) 500 MG capsule Take 1 capsule (500 mg total) by mouth 3 (three) times daily.  90 capsule  1  . promethazine (PHENERGAN) 25 MG tablet Take 25 mg by mouth every 6 (six) hours as needed for  nausea.      . temazepam (RESTORIL) 15 MG capsule Take 1 capsule (15 mg total) by mouth at bedtime as needed for sleep.  30 capsule  2  . warfarin (COUMADIN) 6 MG tablet Take 6 mg by mouth daily.        Assessment: Patient on chronic warfarin for internal jugular thrombosis.  INR ordered at 0200, not drawn yet.  Goal of Therapy:  INR 2-3    Plan:  follow up with INR and decide course of action based on result.  Christopher Warren, Christopher Warren 03/27/2013,6:29 AM

## 2013-03-27 NOTE — ED Notes (Signed)
MD notified of Pain.  Order for po dilaudid given.  Paging IV team for IV site.  Paged phlebotomy for attempt at labs.  MD made aware of delay

## 2013-03-27 NOTE — Progress Notes (Addendum)
[facility] PROGRESS NOTE  Christopher Warren UJW:[id number] DOB: 1976/03/12 DOA: 03/26/2013 PCP: [name], MD  Brief narrative: 37 y.o. male with past medical history of sickle cell disease, splenectomy, internal jugular thrombosis on Coumadin who presented to [facility] ED 03/26/2013 with pain and swelling around the Port-A-Cath site. Patient reported having fevers at home, 102.72F. was no associated chest pain or shortness of breath. In ED, patient was found to have blood pressure of 110/54, heart rate 56 and t max 98.4 F. white blood cell count was 19.2 and hemoglobin 7.3. BMP was unremarkable. Chest x-ray did not reveal acute cardiopulmonary findings.  Assessment/Plan:  Principal Problem:   Fever in the setting of possible Port-A-Cath infection - Patient started on Vancomycin and Zosyn empirically - Order placed for interventional radiology for Port-A-Cath removal and analyzing tip culture - Followup blood culture results  Active Problems: Sickle cell pain crisis  - Pain management: Continue Dilaudid 8 mg every 6 hours by mouth when necessary moderate pain, continue Dilaudid 2-3 mg every 2 hours as needed IV for severe pain - Adjuvant pain therapy: none  - Wean IV narcotics when pain rated less than 7/10. Today the patient's pain is a level 8/10.  - When able to tolerate oral therapy, convert at 50-75% of IV dose & give PRNs Q 2-3 hours.  - Monitor CBC. Last hemoglobin today 7.3 - Monitor bilirubin/LDH Q 72 hours to monitor hemolysis. Current bilirubin: 3.1. Check LDH and ferritin. - Reticulocyte count 16.  - Continue Hydrea.  - Continue IVF:  NS at 50 cc/hr.  - K pad PRN.  - Continue folic acid.   Leukocytosis - Likely related to central line infection - Management as above with Zosyn and vancomycin   Internal jugular (IJ) vein thromboembolism, acute - coumadin per pahrmacy   Sickle cell anemia - Hemoglobin 7.3 today. Transfuse if hemoglobin less than 7   Code  Status: Full code Family Communication: No family at the bedside Disposition Plan: Home when stable  [name], MD  [facility] Pager [phone]  If 7PM-7AM, please contact night-coverage [email or web address] Password [email or web address] 03/27/2013, 11:04 AM   LOS: 1 day   Consultants:  IR for port-a-cath removal 03/27/2013  Procedures:  Plan for port-a-cath removal  Antibiotics:  Vancomycin 03/27/2013 -->  Zosyn 03/27/2013 -->  HPI/Subjective: Complains of generalized pain, 8/10 today.  Objective: Filed Vitals:   03/26/13 2133 03/27/13 0304 03/27/13 0345 03/27/13 0937  BP: 111/57 93/35 110/33 110/54  Pulse: 91 64 63 56  Temp: 98.4 F (36.9 C) 98.3 F (36.8 C) 98.5 F (36.9 C) 98.4 F (36.9 C)  TempSrc: Oral Oral Oral Oral  Resp: 18 20 19 16   Height:   5\' 5"  (1.651 m)   Weight:   58.514 kg (129 lb)   SpO2: 95% 95% 97% 96%   No intake or output data in the 24 hours ending 03/27/13 1104  Exam:   General:  Pt is alert, follows commands appropriately, not in acute distress  Cardiovascular: Regular rate and rhythm, S1/S2 appreciated, port on right side of the chest, swollen, tender and erythema surrounding the port  Respiratory: Clear to auscultation bilaterally, no wheezing, no crackles, no rhonchi  Abdomen: Soft, non tender, non distended, bowel sounds present, no guarding  Extremities: No edema, pulses DP and PT palpable bilaterally  Neuro: Grossly nonfocal  Data Reviewed: Basic Metabolic Panel:  Recent Labs Lab 03/24/13 1110 03/27/13 0100  NA 136 138  K 3.8 3.7  CL 104 104  CO2 25 25  GLUCOSE 138* 99  BUN 6 7  CREATININE 0.60 0.53  CALCIUM 8.9 9.2   Liver Function Tests:  Recent Labs Lab 03/27/13 0100  AST 54*  ALT 57*  ALKPHOS 121*  BILITOT 3.1*  PROT 7.0  ALBUMIN 3.6   No results found for this basename: LIPASE, AMYLASE,  in the last 168 hours No results found for this basename: AMMONIA,  in the last 168 hours CBC:  Recent Labs Lab  03/24/13 1110 03/27/13 0100  WBC 14.2* 19.2*  NEUTROABS 7.1 11.3*  HGB 7.8* 7.3*  HCT 21.1* 20.1*  MCV 86.5 87.4  PLT 538* 522*   Cardiac Enzymes: No results found for this basename: CKTOTAL, CKMB, CKMBINDEX, TROPONINI,  in the last 168 hours BNP: No components found with this basename: POCBNP,  CBG: No results found for this basename: GLUCAP,  in the last 168 hours  No results found for this or any previous visit (from the past 240 hour(s)).   Studies: Dg Chest 2 View 03/27/2013    IMPRESSION: No active cardiopulmonary disease. Unremarkable appearing right Port-A-Cath.   Electronically Signed   By: Davonna Belling M.D.   On: 03/27/2013 00:02    Scheduled Meds: . deferoxamine   2,000 mg Intravenous QHS  . docusate sodium  100 mg Oral BID  . folic acid  1 mg Oral Daily  . hydroxyurea  500 mg Oral TID  . LORazepam  1 mg Intravenous QHS  . piperacillin-tazobactam  3.375 g Intravenous Q8H  . vancomycin  750 mg Intravenous Q8H  . Warfarin    Does not apply q1800   Continuous Infusions: . sodium chloride 125 mL/hr at 03/27/13 0345

## 2013-03-27 NOTE — Progress Notes (Signed)
Vitamin K ordered to reverse INR for port-a-cath removal likely in am. [name] [facility] [phone]

## 2013-03-28 DIAGNOSIS — D571 Sickle-cell disease without crisis: Secondary | ICD-10-CM

## 2013-03-28 DIAGNOSIS — T889XXS Complication of surgical and medical care, unspecified, sequela: Secondary | ICD-10-CM

## 2013-03-28 LAB — BASIC METABOLIC PANEL
CO2: 25 mEq/L (ref 19–32)
Chloride: 103 mEq/L (ref 96–112)
Creatinine, Ser: 0.69 mg/dL (ref 0.50–1.35)
GFR calc Af Amer: >90 mL/min (ref >90)
Glucose, Bld: 114 mg/dL — ABNORMAL HIGH (ref 70–99)
Potassium: 3.5 mEq/L (ref 3.5–5.1)
Sodium: 135 mEq/L (ref 135–145)

## 2013-03-28 LAB — PREPARE RBC (CROSSMATCH)

## 2013-03-28 LAB — VANCOMYCIN, TROUGH: Vancomycin Tr: 10.5 ug/mL (ref 10.0–20.0)

## 2013-03-28 LAB — PROTIME-INR: INR: 1.84 — ABNORMAL HIGH (ref 0.00–1.49)

## 2013-03-28 MED ORDER — VANCOMYCIN HCL IN DEXTROSE 1-5 GM/200ML-% IV SOLN
1000.0000 mg | Freq: Three times a day (TID) | INTRAVENOUS | Status: DC
  Administered 2013-03-28 – 2013-03-29 (×2): 1000 mg via INTRAVENOUS
  Filled 2013-03-28 (×3): qty 200

## 2013-03-28 MED ORDER — PHYTONADIONE 5 MG PO TABS
2.5000 mg | ORAL_TABLET | Freq: Once | ORAL | Status: AC
  Administered 2013-03-28: 2.5 mg via ORAL
  Filled 2013-03-28: qty 1

## 2013-03-28 MED ORDER — VANCOMYCIN HCL IN DEXTROSE 1-5 GM/200ML-% IV SOLN
1000.0000 mg | Freq: Three times a day (TID) | INTRAVENOUS | Status: DC
  Filled 2013-03-28: qty 200

## 2013-03-28 NOTE — Progress Notes (Signed)
ANTIBIOTIC CONSULT NOTE - FOLLOW UP  Pharmacy Consult for Vancomycin Indication: Line infection/PAC  Allergies  Allergen Reactions  . Morphine And Related Hives    Patient states can take Dilaudid with benadryl  . Adhesive [Tape] Rash  . Latex Hives and Rash   Patient Measurements: Height: 5\' 5"  (165.1 cm) Weight: 129 lb (58.514 kg) IBW/kg (Calculated) : 61.5  Vital Signs: Temp: 98 F (36.7 C) (10/16 0556) Temp src: Oral (10/16 0556) BP: 107/49 mmHg (10/16 0556) Pulse Rate: 76 (10/16 0556) Intake/Output from previous day: 10/15 0701 - 10/16 0700 In: 2876 [P.O.:1440; I.V.:1436] Out: -  Intake/Output from this shift:   Labs:  Recent Labs  03/27/13 0100 03/27/13 1135 03/28/13 0750  WBC 19.2* 19.3*  --   HGB 7.3* 7.0*  --   PLT 522* 526*  --   CREATININE 0.53  --  0.69   Estimated Creatinine Clearance: 107.7 ml/min (by C-G formula based on Cr of 0.69).  Recent Labs  03/28/13 1353  VANCOTROUGH 10.5    Microbiology: Recent Results (from the past 720 hour(s))  CULTURE, BLOOD (ROUTINE X 2)     Status: None   Collection Time    03/27/13  4:35 AM      Result Value Range Status   Specimen Description BLOOD LEFT ANTECUBITAL   Final   Special Requests BOTTLES DRAWN AEROBIC ONLY 1CC   Final   Culture  Setup Time     Final   Value: 03/27/2013 09:13     Performed at [facility]   Culture     Final   Value:        BLOOD CULTURE RECEIVED NO GROWTH TO DATE CULTURE WILL BE HELD FOR 5 DAYS BEFORE ISSUING A FINAL NEGATIVE REPORT     Performed at [facility]   Report Status PENDING   Incomplete  CULTURE, BLOOD (ROUTINE X 2)     Status: None   Collection Time    03/27/13  4:35 AM      Result Value Range Status   Specimen Description BLOOD RIGHT ARM   Final   Special Requests BOTTLES DRAWN AEROBIC ONLY 1CC   Final   Culture  Setup Time     Final   Value: 03/27/2013 09:13     Performed at [facility]   Culture     Final   Value:        BLOOD  CULTURE RECEIVED NO GROWTH TO DATE CULTURE WILL BE HELD FOR 5 DAYS BEFORE ISSUING A FINAL NEGATIVE REPORT     Performed at [facility]   Report Status PENDING   Incomplete    Anti-infectives   Start     Dose/Rate Route Frequency Ordered Stop   03/27/13 1400  piperacillin-tazobactam (ZOSYN) IVPB 3.375 g     3.375 g 12.5 mL/hr over 240 Minutes Intravenous 3 times per day 03/27/13 0324     03/27/13 1400  vancomycin (VANCOCIN) IVPB 750 mg/150 ml premix     750 mg 150 mL/hr over 60 Minutes Intravenous Every 8 hours 03/27/13 0631     03/27/13 0330  piperacillin-tazobactam (ZOSYN) IVPB 3.375 g     3.375 g 100 mL/hr over 30 Minutes Intravenous NOW 03/27/13 0317 03/27/13 0525   03/27/13 0300  vancomycin (VANCOCIN) IVPB 1000 mg/200 mL premix     1,000 mg 200 mL/hr over 60 Minutes Intravenous  Once 03/27/13 0246 03/27/13 0702     Assessment: Christopher Warren with PAC infection. Empiric Vancomycin  and Zosyn begun, Vancomycin dose per pharmacy.  Vancomycin 1gm x1, then 750mg  q8hr. Cl > 100 ml/min  Trough today prior to 4th dose = 10.5 mcg/ml, aiming for goal 15-20  Blood cx pending  Goal of Therapy:  Vancomycin trough level 15-20 mcg/ml  Plan:   Increase Vancomycin to 1gm IV q8hr  Monitor cx, renal function  Otho Bellows PharmD Pager [phone] 03/28/2013, 3:24 PM

## 2013-03-28 NOTE — Progress Notes (Signed)
Patient ID: Christopher Warren, male   DOB: 26-Apr-1976, 37 y.o.   MRN: [id number] Pt tent scheduled for port a cath removal on 10/17. INR today 1.84 and per Dr. Miles Costain recommend additional decrease in INR before removal to minimize bleeding risk. 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 Exam: pt awake but sl drowsy, c/o bil LE pain; chest- CTA bilat; rt chest wall PAC intact with mild edema/tenderness at site; heart- RRR; abd- soft,+BS, NT; ext- no edema.   Filed Vitals:   03/27/13 1740 03/27/13 2124 03/28/13 0200 03/28/13 0556  BP: 115/46 107/49 107/54 107/49  Pulse: 62 69 75 76  Temp: 98.9 F (37.2 C) 98.9 F (37.2 C) 98.5 F (36.9 C) 98 F (36.7 C)  TempSrc: Oral Oral  Oral  Resp: 16 18 18 16   Height:      Weight:      SpO2: 95% 96% 98% 98%   Past Medical History  Diagnosis Date  . Sickle cell anemia   . Asthma   . 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 Heart murmur    Past Surgical History  Procedure Laterality Date  . Cholecystectomy    . 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 Port-A-Cath pain for 2 days.  EXAM: CHEST  2 VIEW  COMPARISON:  12/11/2012.  FINDINGS: Port-A-Cath appears unchanged from prior chest x-ray with right supraclavicular approach. Tip lies proximal right atrium. Chronic mild interstitial thickening of the lungs without active infiltrates, effusion, or pneumothorax. Enlarged heart. Sickle cell changes of the bones.  IMPRESSION: No active cardiopulmonary disease. Unremarkable appearing right Port-A-Cath.   Electronically Signed   By: Davonna Belling M.D.   On: 03/27/2013 00:02  Results for orders placed during the hospital encounter of 03/26/13  CULTURE, BLOOD (ROUTINE X 2)      Result  Value Range   Specimen Description BLOOD LEFT ANTECUBITAL     Special Requests BOTTLES DRAWN AEROBIC ONLY 1CC     Culture  Setup Time       Value: 03/27/2013 09:13     Performed at [facility]   Culture       Value:        BLOOD CULTURE RECEIVED NO GROWTH TO DATE CULTURE WILL BE HELD FOR 5 DAYS BEFORE ISSUING A FINAL NEGATIVE REPORT     Performed at [facility]   Report Status PENDING    CULTURE, BLOOD (ROUTINE X 2)      Result Value Range   Specimen Description BLOOD RIGHT ARM     Special Requests BOTTLES DRAWN AEROBIC ONLY 1CC     Culture  Setup Time       Value: 03/27/2013 09:13     Performed at [facility]   Culture       Value:        BLOOD CULTURE RECEIVED NO GROWTH TO DATE CULTURE WILL BE HELD FOR 5 DAYS BEFORE ISSUING A FINAL NEGATIVE REPORT     Performed at [facility]   Report Status PENDING    CBC WITH DIFFERENTIAL      Result Value Range   WBC 19.2 (*) 4.0 - 10.5 K/uL   RBC 2.30 (*) 4.22 - 5.81  MIL/uL   Hemoglobin 7.3 (*) 13.0 - 17.0 g/dL   HCT 16.1 (*) 09.6 - 04.5 %   MCV 87.4  78.0 - 100.0 fL   MCH 31.7  26.0 - 34.0 pg   MCHC 36.3 (*) 30.0 - 36.0 g/dL   RDW 40.9 (*) 81.1 - 91.4 %   Platelets 522 (*) 150 - 400 K/uL   Neutrophils Relative % 59  43 - 77 %   Lymphocytes Relative 26  12 - 46 %   Monocytes Relative 11  3 - 12 %   Eosinophils Relative 3  0 - 5 %   Basophils Relative 1  0 - 1 %   Neutro Abs 11.3 (*) 1.7 - 7.7 K/uL   Lymphs Abs 5.0 (*) 0.7 - 4.0 K/uL   Monocytes Absolute 2.1 (*) 0.1 - 1.0 K/uL   Eosinophils Absolute 0.6  0.0 - 0.7 K/uL   Basophils Absolute 0.2 (*) 0.0 - 0.1 K/uL   RBC Morphology POLYCHROMASIA PRESENT     Smear Review LARGE PLATELETS PRESENT    COMPREHENSIVE METABOLIC PANEL      Result Value Range   Sodium 138  135 - 145 mEq/L   Potassium 3.7  3.5 - 5.1 mEq/L   Chloride 104  96 - 112 mEq/L   CO2 25  19 - 32 mEq/L   Glucose, Bld 99  70 - 99 mg/dL   BUN 7  6 - 23 mg/dL   Creatinine, Ser 7.82   0.50 - 1.35 mg/dL   Calcium 9.2  8.4 - 95.6 mg/dL   Total Protein 7.0  6.0 - 8.3 g/dL   Albumin 3.6  3.5 - 5.2 g/dL   AST 54 (*) 0 - 37 U/L   ALT 57 (*) 0 - 53 U/L   Alkaline Phosphatase 121 (*) 39 - 117 U/L   Total Bilirubin 3.1 (*) 0.3 - 1.2 mg/dL   GFR calc non Af Amer >90  >90 mL/min   GFR calc Af Amer >90  >90 mL/min  RETICULOCYTES      Result Value Range   Retic Ct Pct 16.0 (*) 0.4 - 3.1 %   RBC. 2.30 (*) 4.22 - 5.81 MIL/uL   Retic Count, Manual 368.0 (*) 19.0 - 186.0 K/uL  CBC WITH DIFFERENTIAL      Result Value Range   WBC 19.3 (*) 4.0 - 10.5 K/uL   RBC 2.22 (*) 4.22 - 5.81 MIL/uL   Hemoglobin 7.0 (*) 13.0 - 17.0 g/dL   HCT 21.3 (*) 08.6 - 57.8 %   MCV 86.9  78.0 - 100.0 fL   MCH 31.5  26.0 - 34.0 pg   MCHC 36.3 (*) 30.0 - 36.0 g/dL   RDW 46.9 (*) 62.9 - 52.8 %   Platelets 526 (*) 150 - 400 K/uL   Neutrophils Relative % 63  43 - 77 %   Lymphocytes Relative 23  12 - 46 %   Monocytes Relative 10  3 - 12 %   Eosinophils Relative 3  0 - 5 %   Basophils Relative 1  0 - 1 %   Neutro Abs 12.2 (*) 1.7 - 7.7 K/uL   Lymphs Abs 4.4 (*) 0.7 - 4.0 K/uL   Monocytes Absolute 1.9 (*) 0.1 - 1.0 K/uL   Eosinophils Absolute 0.6  0.0 - 0.7 K/uL   Basophils Absolute 0.2 (*) 0.0 - 0.1 K/uL   RBC Morphology MARKED POLYCHROMASIA     WBC Morphology ATYPICAL LYMPHOCYTES  Smear Review PLATELET COUNT CONFIRMED BY SMEAR    PROTIME-INR      Result Value Range   Prothrombin Time 30.7 (*) 11.6 - 15.2 seconds   INR 3.08 (*) 0.00 - 1.49  LACTIC ACID, PLASMA      Result Value Range   Lactic Acid, Venous 0.7  0.5 - 2.2 mmol/L  FERRITIN      Result Value Range   Ferritin 4227 (*) 22 - 322 ng/mL  PROTIME-INR      Result Value Range   Prothrombin Time 20.7 (*) 11.6 - 15.2 seconds   INR 1.84 (*) 0.00 - 1.49  BASIC METABOLIC PANEL      Result Value Range   Sodium 135  135 - 145 mEq/L   Potassium 3.5  3.5 - 5.1 mEq/L   Chloride 103  96 - 112 mEq/L   CO2 25  19 - 32 mEq/L   Glucose, Bld  114 (*) 70 - 99 mg/dL   BUN 4 (*) 6 - 23 mg/dL   Creatinine, Ser 3.24  0.50 - 1.35 mg/dL   Calcium 8.9  8.4 - 40.1 mg/dL   GFR calc non Af Amer >90  >90 mL/min   GFR calc Af Amer >90  >90 mL/min   A/P: Pt with hx of sickle cell crisis, recent fevers/leukocytosis, rt IJ thrombosis diagnosed in 11/2012 (tx with coumadin)  and swelling/pain of port site after recent access/flushing. Blood cx's pending. Tent plan is for port a cath removal on 10/17 pending f/u PT/INR results. Details of procedure d/w pt with his understanding and consent. Coumadin has been held. If pt needs new port for long term access would wait until completion of antibiotic therapy and resolution of leukocytosis before placing.

## 2013-03-28 NOTE — Progress Notes (Signed)
ANTICOAGULATION CONSULT NOTE - Follow Up Consult  Pharmacy Consult for warfarin Indication: IJ vein thrombosis diagnosed in 11/2012  Allergies  Allergen Reactions  . Morphine And Related Hives    Patient states can take Dilaudid with benadryl  . Adhesive [Tape] Rash  . 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 He is on chronic Coumadin for a history of internal jugular vein thrombosis identified in 11/2012. Pharmacy consult to manage Coumadin while in-pt  Pt states Coumadin managed [facility] medicine- he gave me permission to contact them, but when I called them, they said it is actually managed at [facility]. Pt's home dose is 6mg  daily  PTA INR Hx per [facility]: INR 1.3 on 9/9, continue 4mg  daily INR 1.2 on 9/16, increase to 6mg  daily, prior on 4mg  daily INR 2.4 on 9/26, continue 6mg  daily INR 3.2 on 10/10, told to hold Coumadin x1 day, then continue 6mg  daily INR 3.4 on 10/14, told to hold Coumadin dose last night, then continue 6mg  daily  Pt's HbSS had been managed through Dr. Myna Hidalgo at [facility] but has been discharged from [facility] and all [facility] services due to aggressive, inappropriate, drug-seeking behavior where he threatened several RNs and patients at Dr. Netty Starring office. [facility] would not accept him as a patient due to this behavior  He was originally refusing INR draws (and all labs) and so MD had ordered enoxaparin while in-patient. I discussed this w pt and he is willing to have labs draws to not have to receive lovenox shots. Holding Warfarin for removal of PAC, no  Lovenox coverage until after PAC removed. Warfarin 4mg  ordered yesterday, discontinued   INR today 1.84 after 2.5mg  Vit K po   Hgb 7.0 (baseline Hgb appears to be 8-9 per Epic records), Plt 526 yesterday  No bleeding has been noted  Goal of Therapy:  INR 2-3   Plan:   Continue to hold Warfarin, additional Vitamin K 2.5mg  po today  Plan PAC removal, hopefully tomorrow  Lovenox bridge to begin after PAC removal (may need to discuss with patient-did not want Lovenox when threatening to refuse PT/INR)  Daily PT/INR  Thank you for the consult.  Otho Bellows PharmD Pager [phone] 03/28/2013, 10:50 AM

## 2013-03-28 NOTE — Progress Notes (Signed)
[facility] PROGRESS NOTE  Christopher Warren ZOX:[id number] DOB: 1976/02/23 DOA: 03/26/2013 PCP: [name], MD  Brief narrative: 37 y.o. 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 ED 03/26/2013 with pain and swelling around the Port-A-Cath site. Patient reported having fevers at home, 102.61F. was no associated chest pain or shortness of breath.  In ED, patient was found to have blood pressure of 110/54, heart rate 56 and t max 98.4 F. white blood cell count was 19.2 and hemoglobin 7.3. BMP was unremarkable. Chest x-ray did not reveal acute cardiopulmonary findings.   Assessment/Plan:   Principal Problem:  Fever in the setting of possible Port-A-Cath infection  - Patient started on Vancomycin and Zosyn empirically  - Order placed for interventional radiology for Port-A-Cath removal which will be done once INR reversed. Given vitamin K 2.5 mg 10/15 and 10/16 - Followup blood culture results - pending Active Problems:  Sickle cell pain crisis  - Pain management: Continue Dilaudid 8 mg every 6 hours by mouth when necessary moderate pain, continue Dilaudid 2-3 mg every 2 hours as needed IV for severe pain  - Adjuvant pain therapy: none  - Wean IV narcotics when pain rated less than 7/10. Today the patient's pain is again rated at level 8/10.  - When able to tolerate oral therapy, convert at 50-75% of IV dose & give PRNs Q 2-3 hours.  - Monitor CBC. Last hemoglobin today 7.0. Transfuse 1 unit PRBC today - Monitor bilirubin/LDH Q 72 hours to monitor hemolysis. Current bilirubin: 3.1. Ferritin 4,227 today. Start desferal. - Reticulocyte count 16.  - Continue Hydrea.  - Continue IVF: NS at 50 cc/hr.  - K pad PRN.  - Continue folic acid.  Leukocytosis  - Likely related to central line infection  - Management as above with Zosyn and vancomycin  Internal jugular (IJ) vein thromboembolism, acute   - coumadin per pahrmacy but now on hold as we need to reverse INR for port-a-cath removal Sickle cell anemia  - Hemoglobin 7.3 today. Transfuse 1 unit PRBC today   Code Status: Full code  Family Communication: No family at the bedside  Disposition Plan: Home when stable   [name], MD  [facility]  Pager [phone]   Consultants:  IR for port-a-cath removal 03/27/2013 Procedures:  Plan for port-a-cath removal Antibiotics:  Vancomycin 03/27/2013 -->  Zosyn 03/27/2013 -->    If 7PM-7AM, please contact night-coverage [email or web address] Password [email or web address] 03/28/2013, 6:59 AM   LOS: 2 days    HPI/Subjective: No overnight events.  Objective: Filed Vitals:   03/27/13 1740 03/27/13 2124 03/28/13 0200 03/28/13 0556  BP: 115/46 107/49 107/54 107/49  Pulse: 62 69 75 76  Temp: 98.9 F (37.2 C) 98.9 F (37.2 C) 98.5 F (36.9 C) 98 F (36.7 C)  TempSrc: Oral Oral  Oral  Resp: 16 18 18 16   Height:      Weight:      SpO2: 95% 96% 98% 98%    Intake/Output Summary (Last 24 hours) at 03/28/13 0659 Last data filed at 03/28/13 0557  Gross per 24 hour  Intake   2876 ml  Output      0 ml  Net   2876 ml    Exam:   General:  Pt is alert, follows commands appropriately, not in acute distress  Cardiovascular: Regular rate and rhythm, S1/S2 appreciated; port-a-cath with erythema and swelling  Respiratory: Clear to auscultation bilaterally, no wheezing, no  crackles, no rhonchi  Abdomen: Soft, non tender, non distended, bowel sounds present, no guarding  Extremities: No edema, pulses DP and PT palpable bilaterally  Neuro: Grossly nonfocal  Data Reviewed: Basic Metabolic Panel:  Recent Labs Lab 03/24/13 1110 03/27/13 0100  NA 136 138  K 3.8 3.7  CL 104 104  CO2 25 25  GLUCOSE 138* 99  BUN 6 7  CREATININE 0.60 0.53  CALCIUM 8.9 9.2   Liver Function Tests:  Recent Labs Lab 03/27/13 0100  AST 54*  ALT 57*  ALKPHOS 121*  BILITOT 3.1*  PROT 7.0   ALBUMIN 3.6   No results found for this basename: LIPASE, AMYLASE,  in the last 168 hours No results found for this basename: AMMONIA,  in the last 168 hours CBC:  Recent Labs Lab 03/24/13 1110 03/27/13 0100 03/27/13 1135  WBC 14.2* 19.2* 19.3*  NEUTROABS 7.1 11.3* 12.2*  HGB 7.8* 7.3* 7.0*  HCT 21.1* 20.1* 19.3*  MCV 86.5 87.4 86.9  PLT 538* 522* 526*   Cardiac Enzymes: No results found for this basename: CKTOTAL, CKMB, CKMBINDEX, TROPONINI,  in the last 168 hours BNP: No components found with this basename: POCBNP,  CBG: No results found for this basename: GLUCAP,  in the last 168 hours  No results found for this or any previous visit (from the past 240 hour(s)).   Studies: Dg Chest 2 View  03/27/2013   CLINICAL DATA:  Sickle cell disease. Port-A-Cath pain for 2 days.  EXAM: CHEST  2 VIEW  COMPARISON:  12/11/2012.  FINDINGS: Port-A-Cath appears unchanged from prior chest x-ray with right supraclavicular approach. Tip lies proximal right atrium. Chronic mild interstitial thickening of the lungs without active infiltrates, effusion, or pneumothorax. Enlarged heart. Sickle cell changes of the bones.  IMPRESSION: No active cardiopulmonary disease. Unremarkable appearing right Port-A-Cath.   Electronically Signed   By: Davonna Belling M.D.   On: 03/27/2013 00:02    Scheduled Meds: . deferoxamine (DESFERAL) IV  2,000 mg Intravenous QHS  . docusate sodium  100 mg Oral BID  . folic acid  1 mg Oral Daily  . hydroxyurea  500 mg Oral TID  . LORazepam  1 mg Intravenous QHS  . piperacillin-tazobactam  3.375 g Intravenous Q8H  . vancomycin  750 mg Intravenous Q8H   Continuous Infusions: . sodium chloride 125 mL/hr at 03/28/13 0600

## 2013-03-29 ENCOUNTER — Inpatient Hospital Stay (HOSPITAL_COMMUNITY): Payer: Medicaid Other

## 2013-03-29 LAB — CBC
HCT: 20.4 % — ABNORMAL LOW (ref 39.0–52.0)
MCHC: 36.8 g/dL — ABNORMAL HIGH (ref 30.0–36.0)
Platelets: 576 10*3/uL — ABNORMAL HIGH (ref 150–400)
RDW: 20.6 % — ABNORMAL HIGH (ref 11.5–15.5)
WBC: 16.4 10*3/uL — ABNORMAL HIGH (ref 4.0–10.5)

## 2013-03-29 LAB — TYPE AND SCREEN
ABO/RH(D): O POS
Unit division: 0

## 2013-03-29 LAB — PROTIME-INR
INR: 1.27 (ref 0.00–1.49)
Prothrombin Time: 15.6 seconds — ABNORMAL HIGH (ref 11.6–15.2)

## 2013-03-29 LAB — APTT: aPTT: 44 seconds — ABNORMAL HIGH (ref 24–37)

## 2013-03-29 MED ORDER — VANCOMYCIN HCL IN DEXTROSE 1-5 GM/200ML-% IV SOLN
1000.0000 mg | Freq: Three times a day (TID) | INTRAVENOUS | Status: DC
  Administered 2013-03-29 – 2013-03-30 (×4): 1000 mg via INTRAVENOUS
  Filled 2013-03-29 (×6): qty 200

## 2013-03-29 MED ORDER — SODIUM CHLORIDE 0.9 % IJ SOLN: 10.0000 mL | INTRAMUSCULAR | Status: DC | PRN

## 2013-03-29 MED ORDER — SODIUM CHLORIDE 0.9 % IJ SOLN
10.0000 mL | Freq: Two times a day (BID) | INTRAMUSCULAR | Status: DC
  Administered 2013-03-29: 20 mL
  Administered 2013-03-30: 10 mL

## 2013-03-29 MED ORDER — FENTANYL CITRATE 0.05 MG/ML IJ SOLN
INTRAMUSCULAR | Status: AC
  Administered 2013-03-29 (×2): 50 ug
  Filled 2013-03-29: qty 6

## 2013-03-29 MED ORDER — HYDROMORPHONE HCL PF 1 MG/ML IJ SOLN
INTRAMUSCULAR | Status: AC
  Administered 2013-03-29: 1 mg
  Filled 2013-03-29: qty 1

## 2013-03-29 NOTE — Procedures (Signed)
Interventional Radiology Procedure Note  Procedure: Right chest wall port removed.  No signs of infection in pocket.  Cath tip sent for Cx.  There was subacute hematoma in the reservoir pocket. Complications: None Recommendations: - Follow line cx  Signed,  Sterling Big, MD Vascular & Interventional Radiologist [facility]

## 2013-03-29 NOTE — Progress Notes (Addendum)
[facility] PROGRESS NOTE  Christopher Warren:[id number] DOB: [birth year] DOA: 03/26/2013 PCP: [name], MD  Brief narrative: 37 y.o. 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 Patient reported having fevers at home, 102.65F. was no associated chest pain or shortness of breath.  In ED, patient was found to have blood pressure of 110/54, heart rate 56 and t max 98.4 F. white blood cell count was 19.2 and hemoglobin 7.3. BMP was unremarkable. Chest x-ray did not reveal acute cardiopulmonary findings.   Assessment/Plan:   Principal Problem:  Fever in the setting of possible Port-A-Cath infection  - Patient started on Vancomycin and Zosyn empirically  - Order placed for interventional radiology for Port-A-Cath removal which will be done once INR reversed. Given vitamin K 2.5 mg 10/15 and 10/16  - Followup blood culture results - no growth. Re-insert port-a-cath Saturday 03/30/2013 - switch to once a day abx in am Active Problems:  Sickle cell pain crisis  - Pain management: Continue Dilaudid 8 mg every 6 hours by mouth when necessary moderate pain, continue Dilaudid 2-3 mg every 2 hours as needed IV for severe pain  - Adjuvant pain therapy: none  - Wean IV narcotics when pain rated less than 7/10. Today the patient's pain is again rated at level 10/10.  - When able to tolerate oral therapy, convert at 50-75% of IV dose & give PRNs Q 2-3 hours.  - Monitor CBC. Last hemoglobin 7.0. Transfuse 1 unit PRBC 10/16. Follow up CBC today after PICC insertion. - Monitor bilirubin/LDH Q 72 hours to monitor hemolysis. Current bilirubin: 3.1. Ferritin 4,227 today. Started desferal. Recheck LDH and ferritin in am - Reticulocyte count 16 on admission. - Continue Hydrea.  - Continue IVF: decrease rate to 50 cc/hr - K pad PRN.  - Continue  folic acid.   Leukocytosis  - Likely related to central line infection  - Management as above with Zosyn and vancomycin  Internal jugular (IJ) vein thromboembolism, acute  - coumadin per pahrmacy but now on hold as we needed to reverse INR for port-a-cath removal  Sickle cell anemia  - Transfuse 1 unit PRBC 10/16  Code Status: Full code  Family Communication: No family at the bedside  Disposition Plan: Home when stable   [name], MD  [facility]  Pager [phone]   Consultants:  IR for port-a-cath removal  Procedures:  Plan for port-a-cath removal 03/29/2013 Antibiotics:  Vancomycin 03/27/2013 -->  Zosyn 03/27/2013 -->   If 7PM-7AM, please contact night-coverage [email or web address] Password [email or web address] 03/29/2013, 11:08 AM   LOS: 3 days    HPI/Subjective: No overnight events.  Objective: Filed Vitals:   03/28/13 1837 03/28/13 1936 03/28/13 1948 03/29/13 0542  BP: 124/97 109/59 112/60 116/62  Pulse: 104 86 85 73  Temp: 99 F (37.2 C) 99.8 F (37.7 C) 99.2 F (37.3 C) 98.9 F (37.2 C)  TempSrc: Oral Oral Oral Oral  Resp: 16 16 16 16   Height:      Weight:      SpO2: 93% 94% 95% 94%    Intake/Output Summary (Last 24 hours) at 03/29/13 1108 Last data filed at 03/29/13 0700  Gross per 24 hour  Intake   1110 ml  Output    500 ml  Net    610 ml    Exam:   General:  Pt is alert, follows commands appropriately, not  in acute distress  Cardiovascular: Regular rate and rhythm, S1/S2, no murmurs, no rubs, no gallops  Respiratory: Clear to auscultation bilaterally, no wheezing, no crackles, no rhonchi  Abdomen: Soft, non tender, non distended, bowel sounds present, no guarding  Extremities: No edema, pulses DP and PT palpable bilaterally  Neuro: Grossly nonfocal  Data Reviewed: Basic Metabolic Panel:  Recent Labs Lab 03/24/13 1110 03/27/13 0100 03/28/13 0750  NA 136 138 135  K 3.8 3.7 3.5  CL 104 104 103  CO2 25 25 25   GLUCOSE 138* 99 114*  BUN  6 7 4*  CREATININE 0.60 0.53 0.69  CALCIUM 8.9 9.2 8.9   Liver Function Tests:  Recent Labs Lab 03/27/13 0100  AST 54*  ALT 57*  ALKPHOS 121*  BILITOT 3.1*  PROT 7.0  ALBUMIN 3.6   No results found for this basename: LIPASE, AMYLASE,  in the last 168 hours No results found for this basename: AMMONIA,  in the last 168 hours CBC:  Recent Labs Lab 03/24/13 1110 03/27/13 0100 03/27/13 1135  WBC 14.2* 19.2* 19.3*  NEUTROABS 7.1 11.3* 12.2*  HGB 7.8* 7.3* 7.0*  HCT 21.1* 20.1* 19.3*  MCV 86.5 87.4 86.9  PLT 538* 522* 526*   Cardiac Enzymes: No results found for this basename: CKTOTAL, CKMB, CKMBINDEX, TROPONINI,  in the last 168 hours BNP: No components found with this basename: POCBNP,  CBG: No results found for this basename: GLUCAP,  in the last 168 hours  CULTURE, BLOOD (ROUTINE X 2)     Status: None   Collection Time    03/27/13  4:35 AM      Result Value Range Status   Specimen Description BLOOD LEFT ANTECUBITAL   Final   Special Requests BOTTLES DRAWN AEROBIC ONLY 1CC   Final   Culture  Setup Time     Final   Value: 03/27/2013 09:13     Performed at [facility]   Culture     Final   Value:        BLOOD CULTURE RECEIVED NO GROWTH TO DATE      Performed at [facility]   Report Status PENDING   Incomplete  CULTURE, BLOOD (ROUTINE X 2)     Status: None   Collection Time    03/27/13  4:35 AM      Result Value Range Status   Specimen Description BLOOD RIGHT ARM   Final   Special Requests BOTTLES DRAWN AEROBIC ONLY 1CC   Final   Culture  Setup Time     Final   Value: 03/27/2013 09:13     Performed at [facility]   Culture     Final   Value:        BLOOD CULTURE RECEIVED NO GROWTH TO DATE      Performed at [facility]   Report Status PENDING   Incomplete     Studies: No results found.  Scheduled Meds: . deferoxamine   2,000 mg Intravenous QHS  . docusate sodium  100 mg Oral BID  . folic acid  1 mg Oral Daily  .  hydroxyurea  500 mg Oral TID  . LORazepam  1 mg Intravenous QHS  . piperacillin-tazobactam  3.375 g Intravenous Q8H  . vancomycin  1,000 mg Intravenous Q8H   Continuous Infusions: . sodium chloride 125 mL/hr at 03/28/13 0600

## 2013-03-29 NOTE — Progress Notes (Signed)
ANTICOAGULATION CONSULT NOTE - Follow Up Consult  Pharmacy Consult for warfarin Indication: IJ vein thrombosis diagnosed in 11/2012  Allergies  Allergen Reactions  . Morphine And Related Hives    Patient states can take Dilaudid with benadryl  . Adhesive [Tape] Rash  . 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 He is on chronic Coumadin for a history of internal jugular vein thrombosis identified in 11/2012. Pharmacy consult to manage Coumadin.  Pt states Coumadin managed [facility] medicine- he gave permission to contact them, but stated he is actually managed at [facility]. Pt's home dose is 6mg  daily  PTA INR Hx per [facility]: INR 1.3 on 9/9, continue 4mg  daily INR 1.2 on 9/16, increase to 6mg  daily, prior on 4mg  daily INR 2.4 on 9/26, continue 6mg  daily INR 3.2 on 10/10, told to hold Coumadin x1 day, then continue 6mg  daily INR 3.4 on 10/14, told to hold Coumadin dose last night, then continue 6mg  daily  Pt's HbSS had been managed through Dr. Myna Hidalgo at [facility] but has been discharged from [facility] and all [facility] services due to aggressive, inappropriate, drug-seeking behavior where he threatened several RNs and patients at Dr. Netty Starring office. [facility] would not accept him as a patient due to this behavior  He was originally refusing INR draws (and all labs) and so MD had ordered enoxaparin while in-patient. Discussed this with pt and he is willing to have labs draws to not have to receive lovenox shots. Holding Warfarin for removal of PAC, no Lovenox coverage until after PAC removed.  Warfarin 4mg  ordered 10/15 not given.   INR 10/16 was 1.84 after 2.5mg  Vit K po 10/15, repeat dose of Vit K 2.5mg  10/16  Lab unable to draw PT/INR, CBC, aPTT this am, attempted venipuncture x 4  Last CBC 10/15: Hgb 7.0 (baseline Hgb appears to be 8-9 per Epic records), Plt 526  No bleeding noted  Goal of Therapy:  INR 2-3   Plan:   Plan PAC removal for line infection, scheduled for today  Await decision with INR unknown this am.   Lovenox bridge to begin after PAC removal (may need to discuss with patient-did not want Lovenox when threatening to refuse PT/INR)  Daily PT/INR  Thank you for the consult.  Otho Bellows PharmD Pager [phone] 03/29/2013, 8:25 AM

## 2013-03-30 LAB — CBC
HCT: 20.5 % — ABNORMAL LOW (ref 39.0–52.0)
Hemoglobin: 7.5 g/dL — ABNORMAL LOW (ref 13.0–17.0)
MCH: 31.6 pg (ref 26.0–34.0)
MCV: 86.5 fL (ref 78.0–100.0)
Platelets: 543 10*3/uL — ABNORMAL HIGH (ref 150–400)
RBC: 2.37 MIL/uL — ABNORMAL LOW (ref 4.22–5.81)
WBC: 17.7 10*3/uL — ABNORMAL HIGH (ref 4.0–10.5)

## 2013-03-30 LAB — COMPREHENSIVE METABOLIC PANEL
Albumin: 3.3 g/dL — ABNORMAL LOW (ref 3.5–5.2)
BUN: 4 mg/dL — ABNORMAL LOW (ref 6–23)
CO2: 27 mEq/L (ref 19–32)
Calcium: 8.7 mg/dL (ref 8.4–10.5)
Chloride: 104 mEq/L (ref 96–112)
Creatinine, Ser: 0.79 mg/dL (ref 0.50–1.35)
GFR calc Af Amer: >90 mL/min (ref >90)
GFR calc non Af Amer: >90 mL/min (ref >90)
Glucose, Bld: 108 mg/dL — ABNORMAL HIGH (ref 70–99)
Total Bilirubin: 4.1 mg/dL — ABNORMAL HIGH (ref 0.3–1.2)

## 2013-03-30 LAB — PROTIME-INR: Prothrombin Time: 15.4 seconds — ABNORMAL HIGH (ref 11.6–15.2)

## 2013-03-30 MED ORDER — POTASSIUM CHLORIDE CRYS ER 20 MEQ PO TBCR
40.0000 meq | EXTENDED_RELEASE_TABLET | Freq: Once | ORAL | Status: AC
  Administered 2013-03-30: 40 meq via ORAL
  Filled 2013-03-30: qty 2

## 2013-03-30 MED ORDER — WARFARIN SODIUM 7.5 MG PO TABS
7.5000 mg | ORAL_TABLET | Freq: Once | ORAL | Status: AC
  Administered 2013-03-30: 7.5 mg via ORAL
  Filled 2013-03-30: qty 1

## 2013-03-30 MED ORDER — HYDROMORPHONE HCL PF 2 MG/ML IJ SOLN
4.0000 mg | INTRAMUSCULAR | Status: DC | PRN
  Administered 2013-03-30 (×3): 4 mg via INTRAVENOUS
  Filled 2013-03-30 (×3): qty 2

## 2013-03-30 MED ORDER — ENOXAPARIN SODIUM 40 MG/0.4ML ~~LOC~~ SOLN
40.0000 mg | SUBCUTANEOUS | Status: DC
  Administered 2013-03-30: 40 mg via SUBCUTANEOUS
  Filled 2013-03-30: qty 0.4

## 2013-03-30 MED ORDER — WARFARIN - PHARMACIST DOSING INPATIENT: Freq: Every day | Status: DC

## 2013-03-30 NOTE — Progress Notes (Addendum)
[facility] PROGRESS NOTE  Christopher Warren VHQ:[id number] DOB: 1976-05-13 DOA: 03/26/2013 PCP: [name], MD  Brief narrative: 37 y.o. 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 Patient reported having fevers at home, 102.54F. was no associated chest pain or shortness of breath.  In ED, patient was found to have blood pressure of 110/54, heart rate 56 and t max 98.4 F. white blood cell count was 19.2 and hemoglobin 7.3. BMP was unremarkable. Chest x-ray did not reveal acute cardiopulmonary findings. Port-a-cath was removed 03/29/13 and blood cultures so far are negative. Pt is till on IV antibiotics until final results of cath tip culture available. Plan to re-insert port-a-cath once we know cultures are all negative.   Assessment/Plan:   Principal Problem:  Fever in the setting of possible Port-A-Cath infection  - Patient started on Vancomycin and Zosyn empirically - Port-A-Cath removed 03/29/2013. Given vitamin K 2.5 mg 10/15 and 10/16 to reverse INR. F/U cath tip culture - Blood culture results - no growth.   Active Problems:  Sickle cell pain crisis  - Pain management: Continue Dilaudid 8 mg every 6 hours by mouth when necessary moderate pain, increase Dilaudid to 4 mg every 2 hours as needed IV for severe pain  - Adjuvant pain therapy: none  - Wean IV narcotics when pain rated less than 7/10. Today the patient's pain is rated at level 10/10.  - When able to tolerate oral therapy, convert at 50-75% of IV dose & give PRNs Q 2-3 hours.  - Monitor CBC. Transfused 1 unit PRBC 10/16.  Hgb stable at 7.5 - Monitor bilirubin/LDH Q 72 hours to monitor hemolysis. Current bilirubin: 4.1 (10/18). Ferritin 4,227. Continue desferal and follow up ferritin level today. LDH 397 on 10/18. - Reticulocyte count 16 on admission.  - Continue  Hydrea and folic acid.  - Continue IVF: decrease rate to 50 cc/hr  - K pad PRN.   Leukocytosis  - Likely related to central line infection  - Management as above with Zosyn and vancomycin  Internal jugular (IJ) vein thromboembolism, acute  - coumadin and Lovenox per pharmacy  Sickle cell anemia  - Transfused 1 unit PRBC 10/16  - hemoglobin stable at 7.5 for past 48 hours.  Code Status: Full code  Family Communication: No family at the bedside  Disposition Plan: Home when stable   [name], MD  [facility]  Pager [phone]   Consultants:  IR for port-a-cath removal  Procedures:  Port-a-cath removal 03/29/2013 Antibiotics:  Vancomycin 03/27/2013 -->  Zosyn 03/27/2013 -->   [name], MD  [facility] Pager [phone]  If 7PM-7AM, please contact night-coverage [email or web address] Password [email or web address] 03/30/2013, 7:10 AM   LOS: 4 days    HPI/Subjective: No overnight events.  Objective: Filed Vitals:   03/29/13 1421 03/29/13 1800 03/29/13 2030 03/30/13 0554  BP: 117/44 120/56 102/71 117/71  Pulse: 73 76 88 82  Temp: 97.7 F (36.5 C) 97.8 F (36.6 C) 99.3 F (37.4 C)   TempSrc: Oral Oral Oral   Resp: 16 16 16 16   Height:      Weight:      SpO2: 94% 94% 93% 94%    Intake/Output Summary (Last 24 hours) at 03/30/13 0710 Last data filed at 03/30/13 0600  Gross per 24 hour  Intake    960 ml  Output      0 ml  Net    960 ml    Exam:   General:  Pt is alert, follows commands appropriately, not in acute distress  Cardiovascular: Regular rate and rhythm, S1/S2, no murmurs, no rubs, no gallops  Respiratory: Clear to auscultation bilaterally, no wheezing, no crackles, no rhonchi  Abdomen: Soft, non tender, non distended, bowel sounds present, no guarding  Extremities: No edema, pulses DP and PT palpable bilaterally  Neuro: Grossly nonfocal  Data Reviewed: Basic Metabolic Panel:  Recent Labs Lab 03/24/13 1110 03/27/13 0100 03/28/13 0750  03/30/13 0620  NA 136 138 135 138  K 3.8 3.7 3.5 3.2*  CL 104 104 103 104  CO2 25 25 25 27   GLUCOSE 138* 99 114* 108*  BUN 6 7 4* 4*  CREATININE 0.60 0.53 0.69 0.79  CALCIUM 8.9 9.2 8.9 8.7   Liver Function Tests:  Recent Labs Lab 03/27/13 0100 03/30/13 0620  AST 54* 32  ALT 57* 29  ALKPHOS 121* 108  BILITOT 3.1* 4.1*  PROT 7.0 6.6  ALBUMIN 3.6 3.3*   No results found for this basename: LIPASE, AMYLASE,  in the last 168 hours No results found for this basename: AMMONIA,  in the last 168 hours CBC:  Recent Labs Lab 03/24/13 1110 03/27/13 0100 03/27/13 1135 03/29/13 1415 03/30/13 0620  WBC 14.2* 19.2* 19.3* 16.4* 17.7*  NEUTROABS 7.1 11.3* 12.2*  --   --   HGB 7.8* 7.3* 7.0* 7.5* 7.5*  HCT 21.1* 20.1* 19.3* 20.4* 20.5*  MCV 86.5 87.4 86.9 85.7 86.5  PLT 538* 522* 526* 576* 543*   Cardiac Enzymes: No results found for this basename: CKTOTAL, CKMB, CKMBINDEX, TROPONINI,  in the last 168 hours BNP: No components found with this basename: POCBNP,  CBG: No results found for this basename: GLUCAP,  in the last 168 hours  Recent Results (from the past 240 hour(s))  CULTURE, BLOOD (ROUTINE X 2)     Status: None   Collection Time    03/27/13  4:35 AM      Result Value Range Status   Specimen Description BLOOD LEFT ANTECUBITAL   Final   Special Requests BOTTLES DRAWN AEROBIC ONLY 1CC   Final   Culture  Setup Time     Final   Value: 03/27/2013 09:13     Performed at [facility]   Culture     Final   Value:        BLOOD CULTURE RECEIVED NO GROWTH TO DATE CULTURE WILL BE HELD FOR 5 DAYS BEFORE ISSUING A FINAL NEGATIVE REPORT     Performed at [facility]   Report Status PENDING   Incomplete  CULTURE, BLOOD (ROUTINE X 2)     Status: None   Collection Time    03/27/13  4:35 AM      Result Value Range Status   Specimen Description BLOOD RIGHT ARM   Final   Special Requests BOTTLES DRAWN AEROBIC ONLY 1CC   Final   Culture  Setup Time     Final    Value: 03/27/2013 09:13     Performed at [facility]   Culture     Final   Value:        BLOOD CULTURE RECEIVED NO GROWTH TO DATE CULTURE WILL BE HELD FOR 5 DAYS BEFORE ISSUING A FINAL NEGATIVE REPORT     Performed at [facility]   Report Status PENDING   Incomplete     Studies: No results found.  Scheduled Meds: .  deferoxamine (DESFERAL) IV  2,000 mg Intravenous QHS  . docusate sodium  100 mg Oral BID  . folic acid  1 mg Oral Daily  . hydroxyurea  500 mg Oral TID  . LORazepam  1 mg Intravenous QHS  . piperacillin-tazobactam  3.375 g Intravenous Q8H  . sodium chloride  10-40 mL Intracatheter Q12H  . vancomycin  1,000 mg Intravenous Q8H   Continuous Infusions: . sodium chloride 125 mL/hr at 03/30/13 0342

## 2013-03-30 NOTE — Progress Notes (Signed)
Pt. Was D/C'd due to abusive and aggressive behavior toward staff.  He was given his D/C paperwork but he refused to sign D/C papers. He was escorted of hospital property by [facility] and security officers.

## 2013-03-30 NOTE — Progress Notes (Signed)
Pt. Called out asking for pain medication. When this RN went to administer medication pt. Stated that he had been told by MD that he could have his pain medication and benadryl together.  RN stated that this is not hospital policy.  Pt. Became very agitated.  RN confirmed with MD and received a verbal order to administer dilaudid and benadryl and dilaudid and phenegran at the same time.  Will continue to monitor pt. Status.

## 2013-03-30 NOTE — Progress Notes (Signed)
ANTICOAGULATION CONSULT NOTE - Initial Consult  Pharmacy Consult for Lovenox and warfarin Indication: IJ vein thrombosis diagnosed in 11/2012  Allergies  Allergen Reactions  . Morphine And Related Hives    Patient states can take Dilaudid with benadryl  . Adhesive [Tape] Rash  . 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 He is on chronic Coumadin for a history of internal jugular vein thrombosis identified in 11/2012.  Pt's home dose was 6mg  daily. Anticoagulation was held/reversed for removal of PAC on 10/17. Now resuming anticoagulation with prophylactic-dose Lovenox and Coumadin per pharmacy. CBC is ok/stable. May be resistant to Coumadin d/t recent Vit. K doses.  Goal of Therapy:  INR 2-3   Plan:   Start Lovenox 40mg  SQ q24h. MD: consider increasing Lovenox to full-dose(60mg  q12h) since <6 months since dx of VTE.   Give Coumadin 7.5mg  PO x 1 today.  Daily PT/INR.  Charolotte Eke, PharmD, pager [phone]. 03/30/2013,1:02 PM.

## 2013-03-30 NOTE — Progress Notes (Signed)
In front of RN pt. Used explicit language towards the nurse tech.  RN reported behavior to the [facility]. AC came and assessed the situation.  Per his recommendations had this RN call MD so the pt. Can be D/C'd. Orders were placed for PICC line removal STAT.  AC and [employer] officers present during D/C process.  Will continue to monitor.

## 2013-03-30 NOTE — Discharge Summary (Addendum)
Physician Discharge Summary  Christopher Warren:[id number] DOB: Mar 13, 1976 DOA: 03/26/2013  PCP: Christopher Macho, MD  Admit date: 03/26/2013 Discharge date: 03/30/2013  Recommendations for Outpatient Follow-up:  1. Patient is being discharged due to aggressive behavior towards the nursing staff and staff on 3 east floor. Patient is using inappropriate language and is verbally abusive to everyone on 3 east to a point it is no longer safe to keep him in the hospital. He is threatening the staff and is violent. Patient is competent to make his decisions in regards to treatment and living arrangements. He will be stat discharged from the hospital. 2. Please continue taking coumadin 6 mg at bedtime as before and have your INR rechecked with PCP Monday 04/01/2013.  You were given Lovenox injection today 03/30/2013. 3. Port-a-cath can be placed on an outpatient basis per your PCP referral. 4. Patient has received 5 days of vancomycin and zosyn. Blood cultures were negative to date. Port-a-cath was removed 03/29/2013.  Discharge Diagnoses:  Principal Problem:   Fever Active Problems:   Central line infection   Hemoglobin S-S disease   Leukocytosis   Sickle cell crisis   Internal jugular (IJ) vein thromboembolism, acute   Sickle cell anemia    Discharge Condition: stat discharge due to violent behavior towards nursing staff  Diet recommendation: as tolerated  History of present illness:  37 y.o. 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 ED 03/26/2013 with pain and swelling around the Port-A-Cath site. Patient reported having fevers at home, 102.32F. was no associated chest pain or shortness of breath.  In ED, patient was found to have blood pressure of 110/54, heart rate 56 and t max 98.4 F. white blood cell count was 19.2 and hemoglobin 7.3. BMP was unremarkable. Chest x-ray did not reveal acute  cardiopulmonary findings. Port-a-cath was removed 03/29/13 and blood cultures so far are negative  Assessment/Plan:   Principal Problem:  Fever in the setting of possible Port-A-Cath infection  - Patient started on Vancomycin and Zosyn empirically. Antibiotics d/c'ed as blood cultures negative.  - Port-A-Cath removed 03/29/2013. Given vitamin K 2.5 mg 10/15 and 10/16 to reverse INR. F/U cath tip culture  - Blood culture results - no growth.  Active Problems:  Sickle cell pain crisis  - Pain management in hospital: Dilaudid 8 mg every 6 hours by mouth when necessary moderate pain, increase Dilaudid to 4 mg every 2 hours as needed IV for severe pain  - Adjuvant pain therapy: none  - Transfused 1 unit PRBC 10/16. Hgb stable at 7.5  - Current bilirubin: 4.1 (10/18). Ferritin 4,227. Continue desferal and follow up ferritin level today. LDH 397 on 10/18.  - Reticulocyte count 16 on admission.  - Continued Hydrea and folic acid.  - Continude IVF: decrease rate to 50 cc/hr  - K pad PRN.  Leukocytosis  - Likely related to central line infection  Internal jugular (IJ) vein thromboembolism, acute  - coumadin and Lovenox per pharmacy; since this is a stat discharge due to pt aggressive behavior he will continue taking coumadin 6 mg as before and he said he has enough meds at home. Sickle cell anemia  - Transfused 1 unit PRBC 10/16  - hemoglobin stable at 7.5 for past 48 hours.   Code Status: Full code  Family Communication: No family at the bedside   [name], MD  [facility]  Pager [phone]   Consultants:  IR  for port-a-cath removal  Procedures:  Port-a-cath removal 03/29/2013 Antibiotics:  Vancomycin 03/26/2013 --> 03/30/2013 Zosyn 03/26/2013 --> 03/30/2013   Signed:  [name], MD  [facility] 03/30/2013, 4:10 PM  Pager #: [phone]   Discharge Exam: Filed Vitals:   03/30/13 0950  BP: 114/67  Pulse: 76  Temp: 99.2 F (37.3 C)  Resp: 16    Filed Vitals:   03/29/13 1800 03/29/13 2030 03/30/13 0554 03/30/13 0950  BP: 120/56 102/71 117/71 114/67  Pulse: 76 88 82 76  Temp: 97.8 F (36.6 C) 99.3 F (37.4 C)  99.2 F (37.3 C)  TempSrc: Oral Oral  Oral  Resp: 16 16 16 16   Height:      Weight:      SpO2: 94% 93% 94% 92%    General: Pt is alert, follows commands appropriately, not in acute distress Cardiovascular: Regular rate and rhythm, S1/S2 +, no murmurs, no rubs, no gallops Respiratory: Clear to auscultation bilaterally, no wheezing, no crackles, no rhonchi Abdominal: Soft, non tender, non distended, bowel sounds +, no guarding Extremities: no edema, no cyanosis, pulses palpable bilaterally DP and PT Neuro: Grossly nonfocal  Discharge Instructions  Discharge Orders   Future Orders Complete By Expires   Call MD for:  persistant dizziness or light-headedness  As directed    Call MD for:  persistant nausea and vomiting  As directed    Diet - low sodium heart healthy  As directed    Discharge instructions  As directed    Comments:     1. Patient is being discharged due to aggressive behavior towards the nursing staff and staff on 3 east floor. Patient is using inappropriate language and is verbally abusive to everyone on 3 east to a point it is no longer safe to keep him in the hospital. He is threatening the staff and is violent. Patient is competent to make his decisions in regards to treatment and living arrangements. He will be stat discharged from the hospital. 2. Please continue taking coumadin 6 mg at bedtime as before and have your INR rechecked with PCP Monday 04/01/2013.  You were given Lovenox injection today 03/30/2013. 3. Port-a-cath can be placed on an outpatient basis per your PCP referral.   Increase activity slowly  As directed        Medication List         albuterol 108 (90 BASE) MCG/ACT inhaler  Commonly known as:  PROVENTIL HFA;VENTOLIN HFA  Inhale 2 puffs into the lungs every 6 (six) hours  as needed for wheezing.     deferasirox 500 MG disintegrating tablet  Commonly known as:  EXJADE  Take 4 tablets (2,000 mg total) by mouth daily.     diphenhydrAMINE 25 mg capsule  Commonly known as:  BENADRYL  Take 25 mg by mouth every 6 (six) hours as needed. Itching.     folic acid 1 MG tablet  Commonly known as:  FOLVITE  Take 2 pills a day.     HYDROmorphone HCl 16 MG T24a  Take 1 tablet (16 mg total) by mouth daily. Please fill on 5/29.     HYDROmorphone 8 MG tablet  Commonly known as:  DILAUDID  Take 1 tablet (8 mg total) by mouth every 6 (six) hours as needed.     hydroxyurea 500 MG capsule  Commonly known as:  HYDREA  Take 1 capsule (500 mg total) by mouth 3 (three) times daily.     promethazine 25 MG tablet  Commonly known  as:  PHENERGAN  Take 25 mg by mouth every 6 (six) hours as needed for nausea.     temazepam 15 MG capsule  Commonly known as:  RESTORIL  Take 1 capsule (15 mg total) by mouth at bedtime as needed for sleep.     warfarin 6 MG tablet  Commonly known as:  COUMADIN  Take 6 mg by mouth daily.           Follow-up Information   Follow up with Christopher Macho, MD On 04/01/2013. (recheck INR 04/01/2013 at 9 am)    Specialty:  Oncology   Contact information:   [address] [place] [address] [phone]        The results of significant diagnostics from this hospitalization (including imaging, microbiology, ancillary and laboratory) are listed below for reference.    Significant Diagnostic Studies: Dg Chest 2 View  03/27/2013   CLINICAL DATA:  Sickle cell disease. Port-A-Cath pain for 2 days.  EXAM: CHEST  2 VIEW  COMPARISON:  12/11/2012.  FINDINGS: Port-A-Cath appears unchanged from prior chest x-ray with right supraclavicular approach. Tip lies proximal right atrium. Chronic mild interstitial thickening of the lungs without active infiltrates, effusion, or pneumothorax. Enlarged heart. Sickle cell changes of the bones.   IMPRESSION: No active cardiopulmonary disease. Unremarkable appearing right Port-A-Cath.   Electronically Signed   By: Davonna Belling M.D.   On: 03/27/2013 00:02    Microbiology: Recent Results (from the past 240 hour(s))  CULTURE, BLOOD (ROUTINE X 2)     Status: None   Collection Time    03/27/13  4:35 AM      Result Value Range Status   Specimen Description BLOOD LEFT ANTECUBITAL   Final   Special Requests BOTTLES DRAWN AEROBIC ONLY 1CC   Final   Culture  Setup Time     Final   Value: 03/27/2013 09:13     Performed at [facility]   Culture     Final   Value:        BLOOD CULTURE RECEIVED NO GROWTH TO DATE CULTURE WILL BE HELD FOR 5 DAYS BEFORE ISSUING A FINAL NEGATIVE REPORT     Performed at [facility]   Report Status PENDING   Incomplete  CULTURE, BLOOD (ROUTINE X 2)     Status: None   Collection Time    03/27/13  4:35 AM      Result Value Range Status   Specimen Description BLOOD RIGHT ARM   Final   Special Requests BOTTLES DRAWN AEROBIC ONLY 1CC   Final   Culture  Setup Time     Final   Value: 03/27/2013 09:13     Performed at [facility]   Culture     Final   Value:        BLOOD CULTURE RECEIVED NO GROWTH TO DATE CULTURE WILL BE HELD FOR 5 DAYS BEFORE ISSUING A FINAL NEGATIVE REPORT     Performed at [facility]   Report Status PENDING   Incomplete  CATH TIP CULTURE     Status: None   Collection Time    03/29/13  5:04 PM      Result Value Range Status   Specimen Description CATH TIP   Final   Special Requests A   Final   Culture     Final   Value: Culture reincubated for better growth     Performed at [facility]   Report Status PENDING  Incomplete     Labs: Basic Metabolic Panel:  Recent Labs Lab 03/24/13 1110 03/27/13 0100 03/28/13 0750 03/30/13 0620  NA 136 138 135 138  K 3.8 3.7 3.5 3.2*  CL 104 104 103 104  CO2 25 25 25 27   GLUCOSE 138* 99 114* 108*  BUN 6 7 4* 4*  CREATININE 0.60 0.53 0.69 0.79   CALCIUM 8.9 9.2 8.9 8.7   Liver Function Tests:  Recent Labs Lab 03/27/13 0100 03/30/13 0620  AST 54* 32  ALT 57* 29  ALKPHOS 121* 108  BILITOT 3.1* 4.1*  PROT 7.0 6.6  ALBUMIN 3.6 3.3*   No results found for this basename: LIPASE, AMYLASE,  in the last 168 hours No results found for this basename: AMMONIA,  in the last 168 hours CBC:  Recent Labs Lab 03/24/13 1110 03/27/13 0100 03/27/13 1135 03/29/13 1415 03/30/13 0620  WBC 14.2* 19.2* 19.3* 16.4* 17.7*  NEUTROABS 7.1 11.3* 12.2*  --   --   HGB 7.8* 7.3* 7.0* 7.5* 7.5*  HCT 21.1* 20.1* 19.3* 20.4* 20.5*  MCV 86.5 87.4 86.9 85.7 86.5  PLT 538* 522* 526* 576* 543*   Cardiac Enzymes: No results found for this basename: CKTOTAL, CKMB, CKMBINDEX, TROPONINI,  in the last 168 hours BNP: BNP (last 3 results) No results found for this basename: PROBNP,  in the last 8760 hours CBG: No results found for this basename: GLUCAP,  in the last 168 hours  Time coordinating discharge: Over 30 minutes

## 2013-03-31 LAB — CATH TIP CULTURE: Culture: >100

## 2013-04-01 NOTE — ED Provider Notes (Signed)
Medical screening examination/treatment/procedure(s) were performed by non-physician practitioner and as supervising physician I was immediately available for consultation/collaboration.   Gwyneth Sprout, MD 04/01/13 [phone]

## 2013-04-02 LAB — CULTURE, BLOOD (ROUTINE X 2): Culture: NO GROWTH

## 2013-04-18 ENCOUNTER — Other Ambulatory Visit: Payer: Self-pay

## 2013-06-09 IMAGING — CR DG CHEST 2V
2 series · 2 of 2 positions shown · non-contrast
Comparison: 10/03/2011.

CLINICAL DATA: Views chest pain.  Sickle cell anemia.  Smoker.

CHEST - 2 VIEW

[w chest pa]
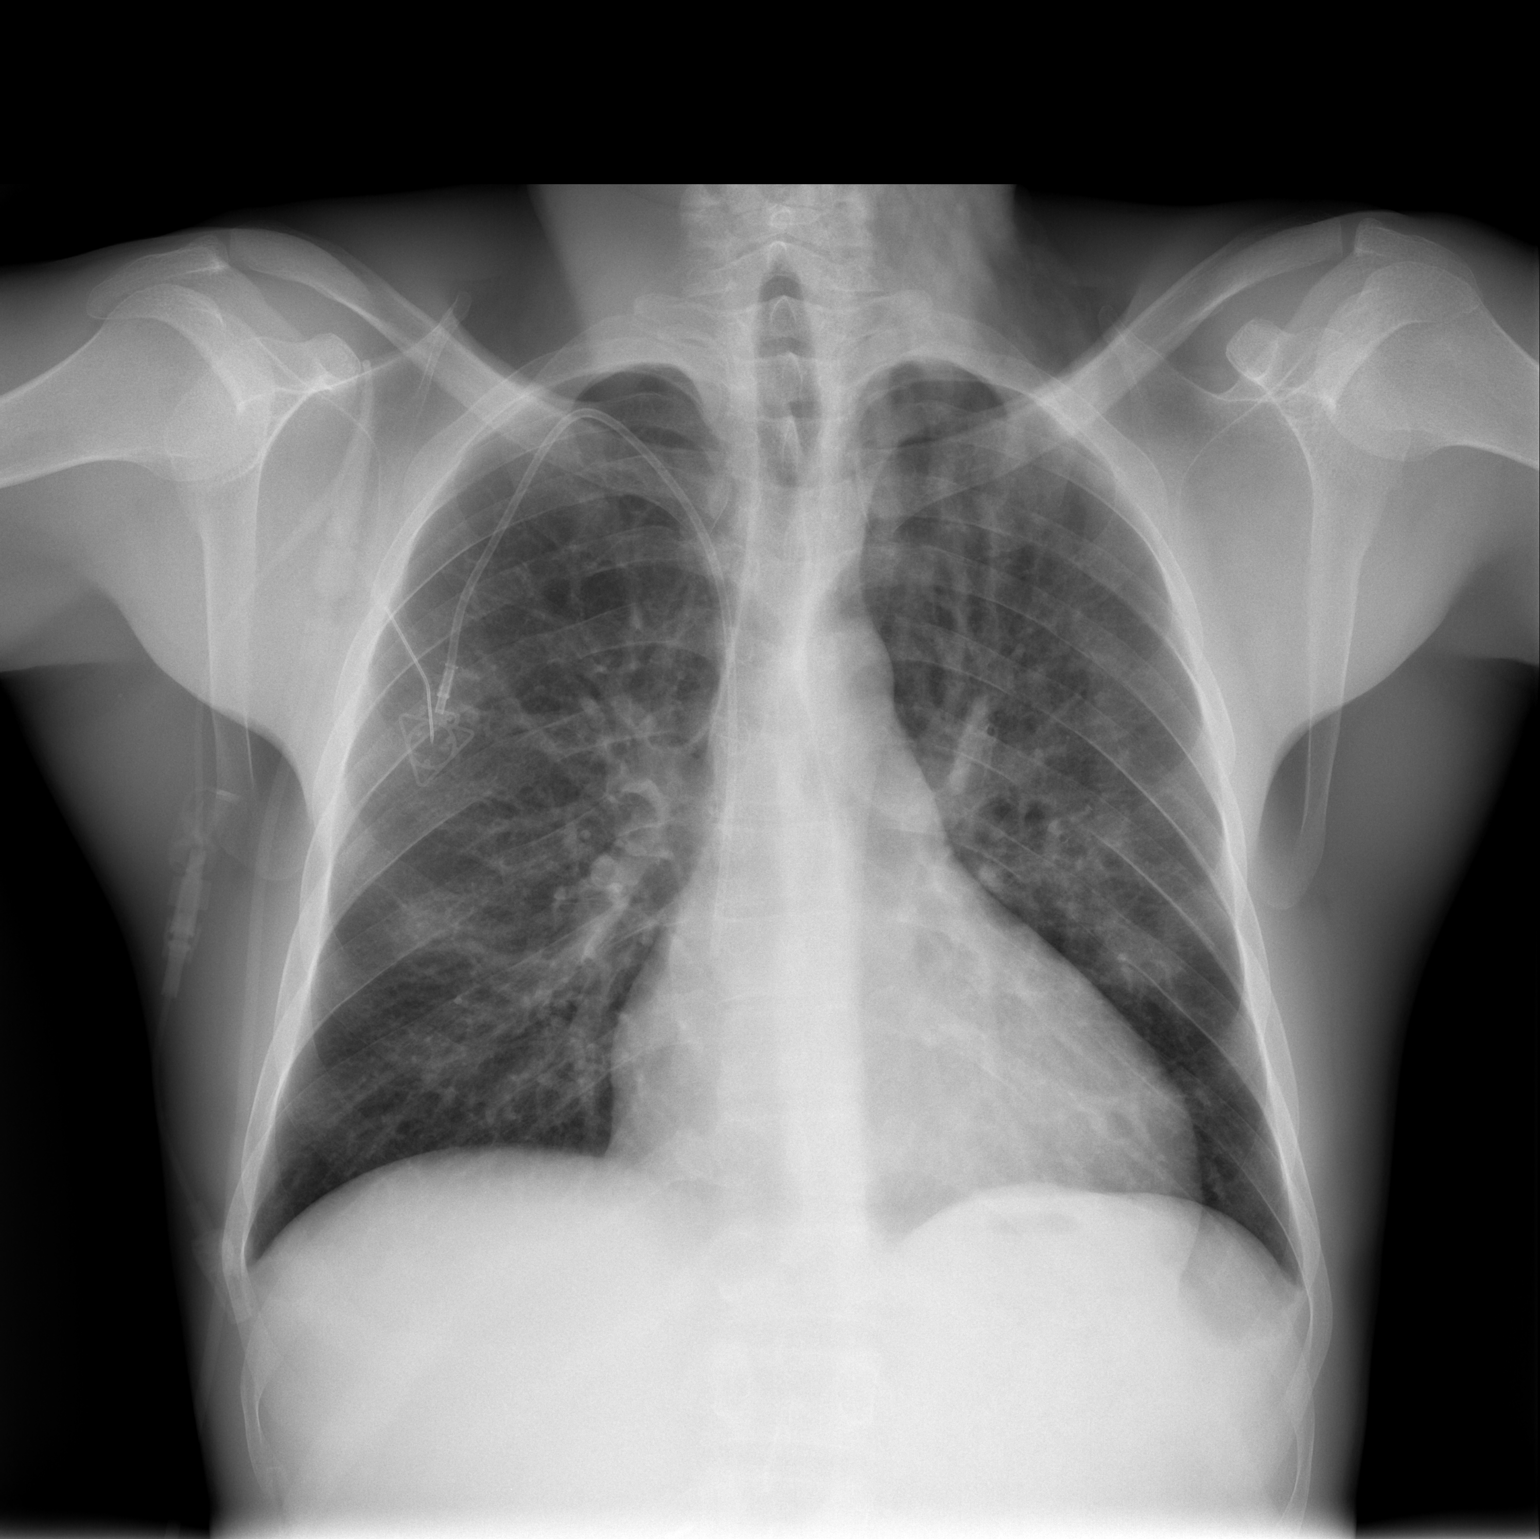

[w chest lat]
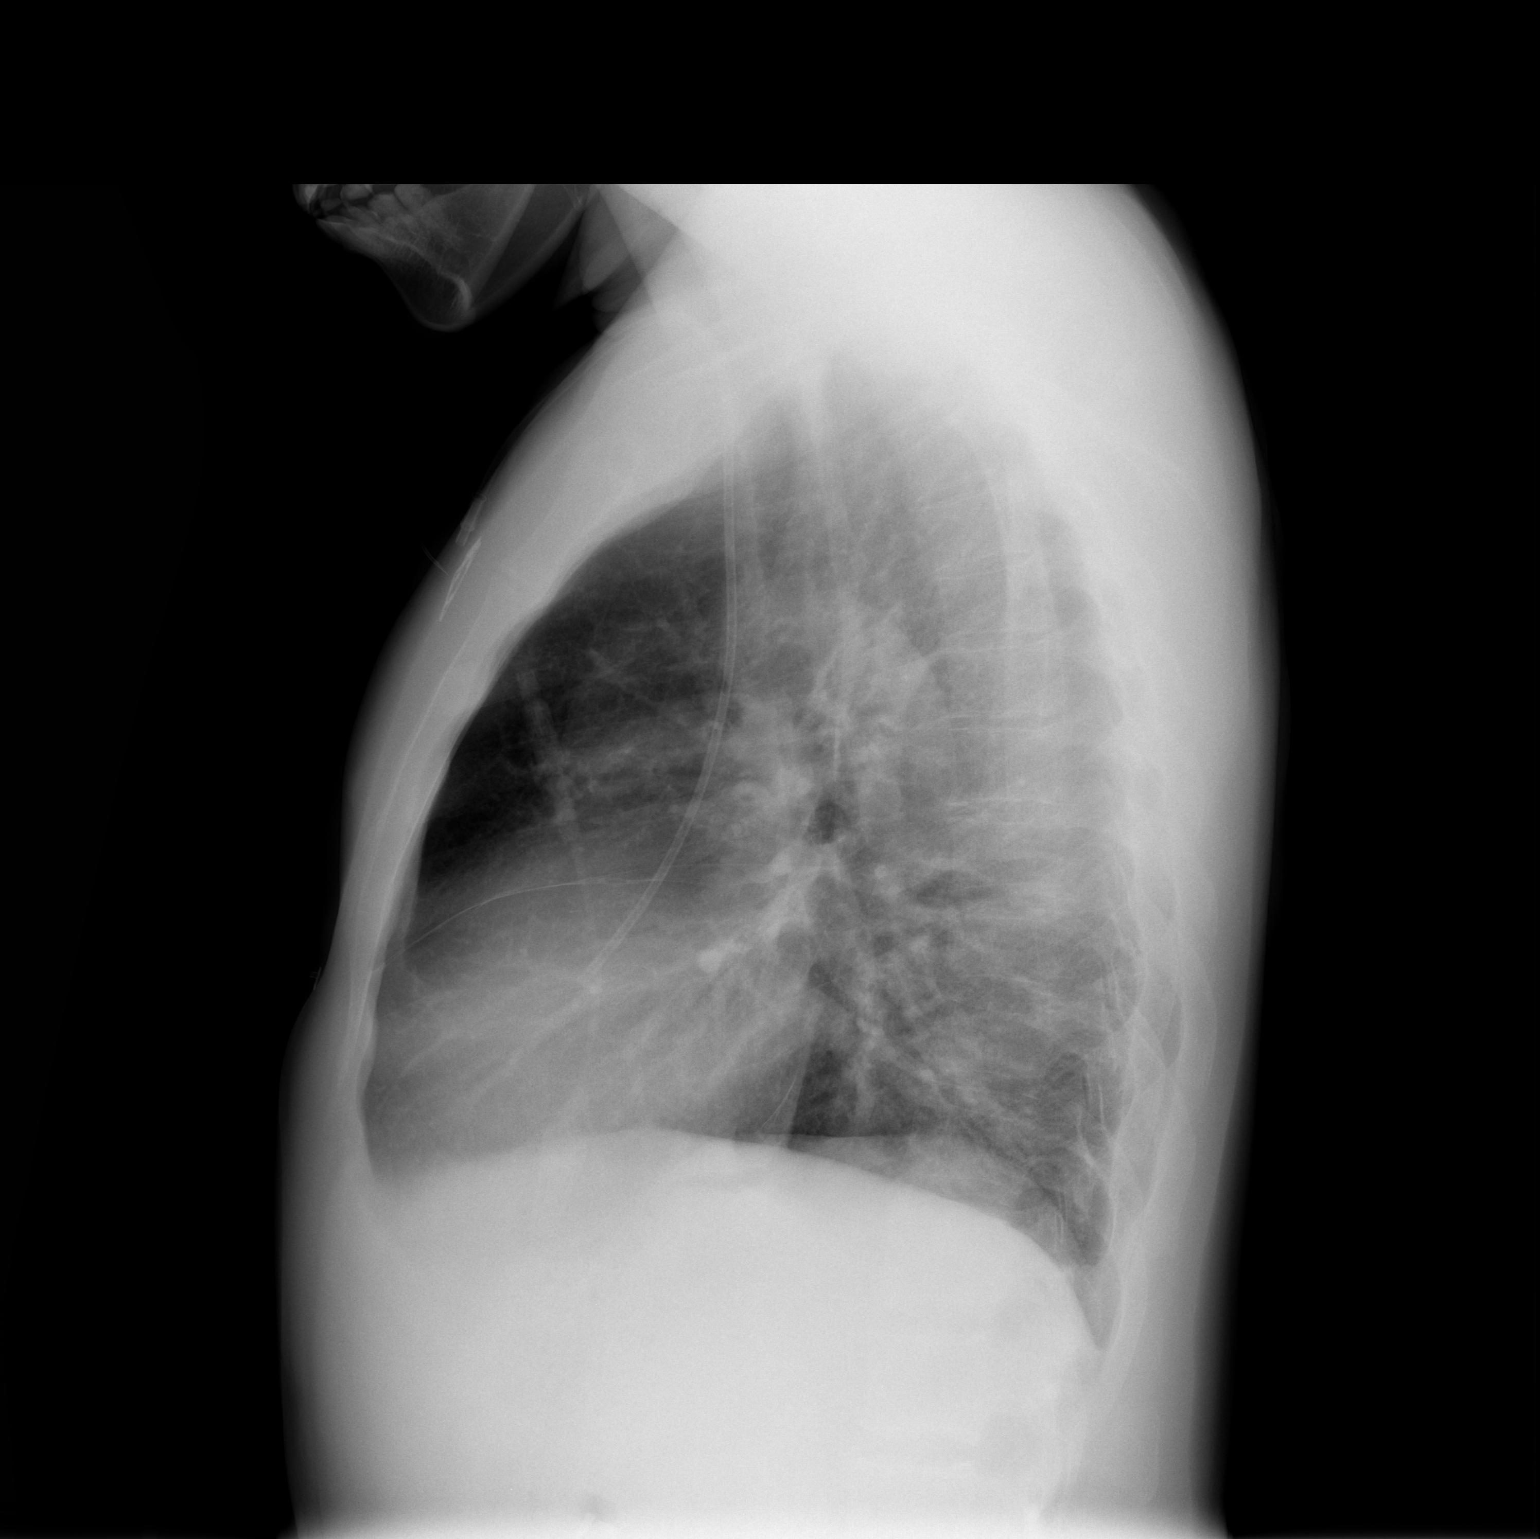

[2 of 2 positions shown; findings below may reference images not displayed]

FINDINGS: Stable enlarged cardiac silhouette and right jugular
porta-catheter.  The lungs remain mildly hyperexpanded with mild
diffuse peribronchial thickening.  Unremarkable bones.
IMPRESSION: 1.  No acute abnormality.
2.  Stable cardiomegaly and mild changes of COPD and chronic
bronchitis.

## 2014-02-02 ENCOUNTER — Encounter: Payer: Self-pay | Admitting: Internal Medicine

## 2014-07-24 ENCOUNTER — Inpatient Hospital Stay (HOSPITAL_COMMUNITY)
Admission: EM | Admit: 2014-07-24 | Discharge: 2014-07-25 | DRG: 811 | Discharge status: Against Medical Advice | Payer: Medicaid Other | Attending: Internal Medicine | Admitting: Internal Medicine

## 2014-07-24 ENCOUNTER — Encounter (HOSPITAL_COMMUNITY): Payer: Self-pay | Admitting: Emergency Medicine

## 2014-07-24 ENCOUNTER — Emergency Department (HOSPITAL_COMMUNITY): Payer: Medicaid Other

## 2014-07-24 DIAGNOSIS — D5701 Hb-SS disease with acute chest syndrome: Principal | ICD-10-CM | POA: Diagnosis present

## 2014-07-24 DIAGNOSIS — J189 Pneumonia, unspecified organism: Secondary | ICD-10-CM | POA: Diagnosis present

## 2014-07-24 DIAGNOSIS — R509 Fever, unspecified: Secondary | ICD-10-CM

## 2014-07-24 DIAGNOSIS — Z720 Tobacco use: Secondary | ICD-10-CM

## 2014-07-24 DIAGNOSIS — J45909 Unspecified asthma, uncomplicated: Secondary | ICD-10-CM | POA: Diagnosis present

## 2014-07-24 DIAGNOSIS — Z885 Allergy status to narcotic agent status: Secondary | ICD-10-CM

## 2014-07-24 DIAGNOSIS — J99 Respiratory disorders in diseases classified elsewhere: Secondary | ICD-10-CM | POA: Diagnosis present

## 2014-07-24 DIAGNOSIS — D571 Sickle-cell disease without crisis: Secondary | ICD-10-CM | POA: Diagnosis present

## 2014-07-24 DIAGNOSIS — D5700 Hb-SS disease with crisis, unspecified: Secondary | ICD-10-CM

## 2014-07-24 DIAGNOSIS — F17210 Nicotine dependence, cigarettes, uncomplicated: Secondary | ICD-10-CM | POA: Diagnosis present

## 2014-07-24 LAB — BLOOD GAS, ARTERIAL
Acid-base deficit: 2.4 mmol/L — ABNORMAL HIGH (ref 0.0–2.0)
BICARBONATE: 22.7 meq/L (ref 20.0–24.0)
Drawn by: 232811
O2 Content: 3 L/min
O2 Saturation: 99 %
PATIENT TEMPERATURE: 98.1
PO2 ART: 106 mmHg — AB (ref 80.0–100.0)
TCO2: 22.8 mmol/L (ref 0–100)
pCO2 arterial: 44.7 mmHg (ref 35.0–45.0)
pH, Arterial: 7.325 — ABNORMAL LOW (ref 7.350–7.450)

## 2014-07-24 LAB — COMPREHENSIVE METABOLIC PANEL
ALT: 26 U/L (ref 0–53)
AST: 47 U/L — ABNORMAL HIGH (ref 0–37)
Albumin: 3.7 g/dL (ref 3.5–5.2)
Alkaline Phosphatase: 96 U/L (ref 39–117)
Anion gap: 7 (ref 5–15)
BUN: 17 mg/dL (ref 6–23)
CO2: 21 mmol/L (ref 19–32)
Calcium: 8.1 mg/dL — ABNORMAL LOW (ref 8.4–10.5)
Chloride: 107 mmol/L (ref 96–112)
Creatinine, Ser: 0.87 mg/dL (ref 0.50–1.35)
GFR calc Af Amer: >90 mL/min (ref >90)
GLUCOSE: 107 mg/dL — AB (ref 70–99)
Potassium: 3.9 mmol/L (ref 3.5–5.1)
Sodium: 135 mmol/L (ref 135–145)
Total Bilirubin: 6.9 mg/dL — ABNORMAL HIGH (ref 0.3–1.2)
Total Protein: 7.3 g/dL (ref 6.0–8.3)

## 2014-07-24 LAB — URINALYSIS, ROUTINE W REFLEX MICROSCOPIC
Bilirubin Urine: NEGATIVE
Glucose, UA: NEGATIVE mg/dL
Ketones, ur: NEGATIVE mg/dL
Leukocytes, UA: NEGATIVE
Nitrite: NEGATIVE
PH: 5.5 (ref 5.0–8.0)
PROTEIN: 30 mg/dL — AB
Specific Gravity, Urine: 1.008 (ref 1.005–1.030)
Urobilinogen, UA: 1 mg/dL (ref 0.0–1.0)

## 2014-07-24 LAB — CBC WITH DIFFERENTIAL/PLATELET
BASOS ABS: 0 10*3/uL (ref 0.0–0.1)
Basophils Relative: 0 % (ref 0–1)
EOS ABS: 0 10*3/uL (ref 0.0–0.7)
Eosinophils Relative: 0 % (ref 0–5)
HEMATOCRIT: 15.2 % — AB (ref 39.0–52.0)
Hemoglobin: 5.5 g/dL — CL (ref 13.0–17.0)
LYMPHS ABS: 3.8 10*3/uL (ref 0.7–4.0)
Lymphocytes Relative: 18 % (ref 12–46)
MCH: 35.3 pg — ABNORMAL HIGH (ref 26.0–34.0)
MCHC: 36.2 g/dL — ABNORMAL HIGH (ref 30.0–36.0)
MCV: 97.4 fL (ref 78.0–100.0)
MONOS PCT: 13 % — AB (ref 3–12)
Monocytes Absolute: 2.7 10*3/uL — ABNORMAL HIGH (ref 0.1–1.0)
NEUTROS ABS: 14.5 10*3/uL — AB (ref 1.7–7.7)
Neutrophils Relative %: 69 % (ref 43–77)
Platelets: 293 10*3/uL (ref 150–400)
RBC: 1.56 MIL/uL — ABNORMAL LOW (ref 4.22–5.81)
RDW: 21.5 % — AB (ref 11.5–15.5)
WBC: 21 10*3/uL — ABNORMAL HIGH (ref 4.0–10.5)

## 2014-07-24 LAB — RETICULOCYTES: RBC.: 1.56 MIL/uL — ABNORMAL LOW (ref 4.22–5.81)

## 2014-07-24 LAB — URINE MICROSCOPIC-ADD ON

## 2014-07-24 LAB — LACTATE DEHYDROGENASE: LDH: 682 U/L — AB (ref 94–250)

## 2014-07-24 LAB — I-STAT TROPONIN, ED: Troponin i, poc: 0.01 ng/mL (ref 0.00–0.08)

## 2014-07-24 LAB — PREPARE RBC (CROSSMATCH)

## 2014-07-24 LAB — I-STAT CG4 LACTIC ACID, ED: LACTIC ACID, VENOUS: 0.89 mmol/L (ref 0.5–2.0)

## 2014-07-24 MED ORDER — ONDANSETRON HCL 4 MG/2ML IJ SOLN
4.0000 mg | Freq: Once | INTRAMUSCULAR | Status: AC
  Administered 2014-07-24: 4 mg via INTRAVENOUS
  Filled 2014-07-24: qty 2

## 2014-07-24 MED ORDER — DEXTROSE-NACL 5-0.45 % IV SOLN
INTRAVENOUS | Status: DC
  Administered 2014-07-25: via INTRAVENOUS

## 2014-07-24 MED ORDER — DEXTROSE 5 % IV SOLN: 500.0000 mg | INTRAVENOUS | Status: DC

## 2014-07-24 MED ORDER — SODIUM CHLORIDE 0.9 % IV SOLN
INTRAVENOUS | Status: DC
  Administered 2014-07-24: via INTRAVENOUS

## 2014-07-24 MED ORDER — HYDROMORPHONE HCL 1 MG/ML IJ SOLN
1.0000 mg | INTRAMUSCULAR | Status: DC | PRN
  Administered 2014-07-25 (×2): 1 mg via INTRAVENOUS
  Filled 2014-07-24 (×2): qty 1

## 2014-07-24 MED ORDER — ONDANSETRON HCL 4 MG/2ML IJ SOLN
4.0000 mg | Freq: Three times a day (TID) | INTRAMUSCULAR | Status: DC | PRN
  Administered 2014-07-25: 4 mg via INTRAVENOUS
  Filled 2014-07-24: qty 2

## 2014-07-24 MED ORDER — DIPHENHYDRAMINE HCL 50 MG/ML IJ SOLN
25.0000 mg | Freq: Once | INTRAMUSCULAR | Status: AC
  Administered 2014-07-24: 25 mg via INTRAVENOUS
  Filled 2014-07-24: qty 1

## 2014-07-24 MED ORDER — FOLIC ACID 1 MG PO TABS
2.0000 mg | ORAL_TABLET | Freq: Every day | ORAL | Status: DC
  Filled 2014-07-24: qty 2

## 2014-07-24 MED ORDER — IBUPROFEN 200 MG PO TABS: 400.0000 mg | ORAL_TABLET | Freq: Four times a day (QID) | ORAL | Status: DC | PRN

## 2014-07-24 MED ORDER — DEXTROSE 5 % IV SOLN
500.0000 mg | Freq: Once | INTRAVENOUS | Status: AC
  Administered 2014-07-24: 500 mg via INTRAVENOUS
  Filled 2014-07-24: qty 500

## 2014-07-24 MED ORDER — DEXTROSE 5 % IV SOLN
1.0000 g | Freq: Once | INTRAVENOUS | Status: AC
  Administered 2014-07-24: 1 g via INTRAVENOUS
  Filled 2014-07-24: qty 10

## 2014-07-24 MED ORDER — HYDROXYUREA 500 MG PO CAPS: 1500.0000 mg | ORAL_CAPSULE | Freq: Every day | ORAL | Status: DC

## 2014-07-24 MED ORDER — SODIUM CHLORIDE 0.9 % IV SOLN
1250.0000 mg | Freq: Two times a day (BID) | INTRAVENOUS | Status: DC
  Administered 2014-07-24: 1250 mg via INTRAVENOUS
  Filled 2014-07-24 (×2): qty 1250

## 2014-07-24 MED ORDER — SODIUM CHLORIDE 0.9 % IV SOLN
10.0000 mL/h | Freq: Once | INTRAVENOUS | Status: AC
  Administered 2014-07-24: 10 mL/h via INTRAVENOUS

## 2014-07-24 MED ORDER — DEXTROSE 5 % IV SOLN
1.0000 g | Freq: Three times a day (TID) | INTRAVENOUS | Status: DC
  Administered 2014-07-25 (×2): 1 g via INTRAVENOUS
  Filled 2014-07-24 (×3): qty 1

## 2014-07-24 MED ORDER — SODIUM CHLORIDE 0.9 % IV BOLUS (SEPSIS)
1000.0000 mL | Freq: Once | INTRAVENOUS | Status: AC
  Administered 2014-07-24: 1000 mL via INTRAVENOUS

## 2014-07-24 MED ORDER — PROMETHAZINE-PHENYLEPHRINE 6.25-5 MG/5ML PO SYRP
5.0000 mL | ORAL_SOLUTION | ORAL | Status: DC | PRN
  Filled 2014-07-24: qty 5

## 2014-07-24 MED ORDER — DEXTROSE 5 % IV SOLN: 1.0000 g | INTRAVENOUS | Status: DC

## 2014-07-24 MED ORDER — NICOTINE 14 MG/24HR TD PT24
14.0000 mg | MEDICATED_PATCH | Freq: Every day | TRANSDERMAL | Status: DC
  Filled 2014-07-24: qty 1

## 2014-07-24 MED ORDER — IOHEXOL 350 MG/ML SOLN
100.0000 mL | Freq: Once | INTRAVENOUS | Status: AC | PRN
  Administered 2014-07-24: 100 mL via INTRAVENOUS

## 2014-07-24 MED ORDER — HYDROMORPHONE HCL 2 MG/ML IJ SOLN
2.0000 mg | INTRAMUSCULAR | Status: DC | PRN
  Administered 2014-07-24 (×2): 2 mg via INTRAVENOUS
  Filled 2014-07-24 (×2): qty 1

## 2014-07-24 MED ORDER — SODIUM CHLORIDE 0.9 % IV BOLUS (SEPSIS)
1000.0000 mL | INTRAVENOUS | Status: AC
  Administered 2014-07-24 (×2): 1000 mL via INTRAVENOUS

## 2014-07-24 MED ORDER — SODIUM CHLORIDE 0.9 % IV BOLUS (SEPSIS): 1000.0000 mL | INTRAVENOUS | Status: AC

## 2014-07-24 MED ORDER — ALBUTEROL SULFATE (2.5 MG/3ML) 0.083% IN NEBU: 3.0000 mL | INHALATION_SOLUTION | Freq: Four times a day (QID) | RESPIRATORY_TRACT | Status: DC | PRN

## 2014-07-24 MED ORDER — HEPARIN SODIUM (PORCINE) 5000 UNIT/ML IJ SOLN
5000.0000 [IU] | Freq: Three times a day (TID) | INTRAMUSCULAR | Status: DC
  Filled 2014-07-24: qty 1

## 2014-07-24 MED ORDER — TEMAZEPAM 15 MG PO CAPS
15.0000 mg | ORAL_CAPSULE | Freq: Every evening | ORAL | Status: DC | PRN
  Administered 2014-07-25: 15 mg via ORAL
  Filled 2014-07-24: qty 1

## 2014-07-24 MED ORDER — PROMETHAZINE HCL 25 MG/ML IJ SOLN
25.0000 mg | Freq: Once | INTRAMUSCULAR | Status: AC
  Administered 2014-07-24: 25 mg via INTRAVENOUS
  Filled 2014-07-24: qty 1

## 2014-07-24 MED ORDER — DIPHENHYDRAMINE HCL 25 MG PO CAPS
25.0000 mg | ORAL_CAPSULE | Freq: Four times a day (QID) | ORAL | Status: DC | PRN
  Administered 2014-07-25: 25 mg via ORAL
  Filled 2014-07-24: qty 1

## 2014-07-24 NOTE — Progress Notes (Signed)
ANTIBIOTIC CONSULT NOTE - INITIAL  Pharmacy Consult for Ceftriaxone, Azithromycin Indication: CAP  Allergies  Allergen Reactions  . Morphine And Related Hives    Patient states can take Dilaudid with benadryl  . Adhesive [Tape] Rash  . Latex Hives and Rash    Patient Measurements:   Last documented weight 58.5 kg (03/27/13)  Vital Signs: Temp: 100 F (37.8 C) (02/11 1737) Temp Source: Oral (02/11 1737) BP: 129/54 mmHg (02/11 1737) Pulse Rate: 116 (02/11 1737) Intake/Output from previous day:   Intake/Output from this shift:    Labs: No results for input(s): WBC, HGB, PLT, LABCREA, CREATININE in the last 72 hours. CrCl cannot be calculated (Unknown ideal weight.). No results for input(s): VANCOTROUGH, VANCOPEAK, VANCORANDOM, GENTTROUGH, GENTPEAK, GENTRANDOM, TOBRATROUGH, TOBRAPEAK, TOBRARND, AMIKACINPEAK, AMIKACINTROU, AMIKACIN in the last 72 hours.   Microbiology: No results found for this or any previous visit (from the past 720 hour(s)).  Medical History: Past Medical History  Diagnosis Date  . Sickle cell anemia   . Asthma   . 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 Heart murmur      Assessment: [age] yoM presented to ED on 2/11 with sickle cell crisis pain and fever.  Code sepsis called in ED with concern for possible acute chest.  Pharmacy is consulted to dose ceftriaxone and azithromycin for CAP.  2/11 >> Ceftriaxone >> 2/11 >> Azithromycin >>    Today, 07/24/2014:   Tmax: 100  WBCs: pending  Renal: SCr 0.87  Lactic acid: 0.89  Goal of Therapy:  Appropriate abx dosing, eradication of infection.   Plan:   Ceftriaxone 1g IV q24h  Azithromycin 500mg  IV q24h  Pharmacy to follow peripherally for cultures and clinical condition.  Lynann Beaverhristine Victoriah Wilds PharmD, BCPS Pager [phone] 07/24/2014 7:19 PM

## 2014-07-24 NOTE — ED Provider Notes (Signed)
CSN: [id number]     Arrival date & time 07/24/14  1733 History   First MD Initiated Contact with Patient 07/24/14 1734     Chief Complaint  Patient presents with  . Sickle Cell Pain Crisis     (Consider location/radiation/quality/duration/timing/severity/associated sxs/prior Treatment) HPI   39 year old male with history of hemoglobin SS disease, asthma, who presents with complaints of sickle cell related pain. Patient states for the past 2 days he has had generalized body aches, with worsening lower back pain, has fever as high as 103.4, having occasional nonproductive cough but with increased shortness of breath. Symptoms felt similar to prior crisis. He has been taking his medication at home and staying active however it has not helped. He usually follow[facility]up at [facility]. He has prior internal jugular thrombosis, on Coumadin but his doctor subsequently discontinue Coumadin more than 6 months ago. He denies any other URI symptoms. Denies nausea vomiting diarrhea, dysuria, or rash. No complaint of headache and neck stiffness. Patient states that his sickle cell is usually well controlled.  Past Medical History  Diagnosis Date  . Sickle cell anemia   . Asthma   . 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 Heart murmur    Past Surgical History  Procedure Laterality Date  . Cholecystectomy    . 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 Sickle cell anemia Mother   . Sickle cell anemia Son    History  Substance Use Topics  . Smoking status: Current Every Day Smoker -- 0.50 packs/day for 5 years    Types: Cigarettes  . Smokeless tobacco: Never Used  . Alcohol Use: No    Review of Systems  All other systems reviewed and are negative.     Allergies  Morphine and related; Adhesive; and Latex  Home Medications    Prior to Admission medications   Medication Sig Start Date End Date Taking? Authorizing Provider  albuterol (PROVENTIL HFA;VENTOLIN HFA) 108 (90 BASE) MCG/ACT inhaler Inhale 2 puffs into the lungs every 6 (six) hours as needed for wheezing. 02/14/12 03/24/13  Josph MachoPeter R Ennever, MD  deferasirox (EXJADE) 500 MG disintegrating tablet Take 4 tablets (2,000 mg total) by mouth daily. 12/17/12   Laveda Normanhris N Oti, MD  diphenhydrAMINE (BENADRYL) 25 mg capsule Take 25 mg by mouth every 6 (six) hours as needed. Itching.    Historical Provider, MD  folic acid (FOLVITE) 1 MG tablet Take 2 pills a day. 12/17/12   Laveda Normanhris N Oti, MD  HYDROmorphone (DILAUDID) 8 MG tablet Take 1 tablet (8 mg total) by mouth every 6 (six) hours as needed. 03/24/13   Antony MaduraKelly Humes, PA-C  HYDROmorphone HCl 16 MG T24A Take 1 tablet (16 mg total) by mouth daily. Please fill on 5/29. 12/17/12   Laveda Normanhris N Oti, MD  hydroxyurea (HYDREA) 500 MG capsule Take 1 capsule (500 mg total) by mouth 3 (three) times daily. 12/17/12   Laveda Normanhris N Oti, MD  promethazine (PHENERGAN) 25 MG tablet Take 25 mg by mouth every 6 (six) hours as needed for nausea.    Historical Provider, MD  temazepam (RESTORIL) 15 MG capsule Take 1 capsule (15 mg total) by mouth at bedtime as needed for sleep. 09/10/12   Josph MachoPeter R Ennever, MD  warfarin (COUMADIN) 6 MG tablet Take 6 mg by mouth daily.  Historical Provider, MD   There were no vitals taken for this visit. Physical Exam  Constitutional: He appears well-developed and well-nourished. No distress.  African-American male appears uncomfortable and jaundice.  HENT:  Head: Atraumatic.  Mouth/Throat: Oropharynx is clear and moist.  Eyes: Conjunctivae are normal. Scleral icterus is present.  Neck: Normal range of motion. Neck supple.  No nuchal rigidity  Cardiovascular: Intact distal pulses.   Tachycardia with faint systolic murmurs, no rubs or gallops  Pulmonary/Chest:  Poor respiratory effort however no obvious rales or rhonchi or wheezes  heard  Left chest Port-A-Cath noted, no signs of infection.  Abdominal: Soft. There is no tenderness.  Musculoskeletal: He exhibits no edema.  No significant midline spine tenderness crepitus or step-off. Paralumbar spinal muscle tenderness on palpation with normal L-spine flexion-extension rotation.  Neurological: He is alert.  Skin: No rash noted.  Psychiatric: He has a normal mood and affect.    ED Course  Procedures (including critical care time)  Patient with history of sickle cell disease presents with fever, body aches, who is tachycardic and hypoxic concerning for acute chest. Workup initiated. Patient meets SIRS criteria with possible pulmonary source. Code Sepsis initiated, early antibiotic intervention initiated. Given his prior history of thromboembolism, and patient presents with hypoxia and tachycardia, chest CTA ordered to rule out PE or infectious source.  CAre discussed with DR. Wofford.  8:02 PM Patient has a hemoglobin of 5.5, his baseline is 7.5. He will receive blood transfusion. His reticulocyte count percentage is greater than 23.  8:40 PM Patient is somnolent, snoring loudly but protecting his airway. No obvious hypoxia on monitor. He has pinpoint pupil. He has received a total of 2 mg of Dilaudid IV. His girlfriend is by his bedside and states that he usually gets stronger pain medication than this and normally mentating appropriately. I discussed with Dr. Loretha Stapler, plan to get CT scan then may consider using narcain.  Nurse will closely monitor pt.    9:29 PM Chest CTA is without evidence of PE however patient does have consolidation to both lung bases consistence with acute chest. Patient has received antibiotic, will consult for admission. Patient able to protect his airway however without supplement oxygenation his sats drops down to the 80s.  Will check ABG.    9:44 PM i have consulted with [facility] Dr. Clyde Lundborg who agrees to admit pt to step down, holding  order placed.  Will transfuse 2 unit of blood product.    CRITICAL CARE Performed by: Fayrene Helper Total critical care time: 1 hr Critical care time was exclusive of separately billable procedures and treating other patients. Critical care was necessary to treat or prevent imminent or life-threatening deterioration. Critical care was time spent personally by me on the following activities: development of treatment plan with patient and/or surrogate as well as nursing, discussions with consultants, evaluation of patient's response to treatment, examination of patient, obtaining history from patient or surrogate, ordering and performing treatments and interventions, ordering and review of laboratory studies, ordering and review of radiographic studies, pulse oximetry and re-evaluation of patient's condition.   Labs Review Labs Reviewed  CBC WITH DIFFERENTIAL/PLATELET - Abnormal; Notable for the following:    RBC 1.56 (*)    Hemoglobin 5.5 (*)    HCT 15.2 (*)    MCH 35.3 (*)    MCHC 36.2 (*)    RDW 21.5 (*)    All other components within normal limits  COMPREHENSIVE METABOLIC PANEL - Abnormal; Notable for the following:  Glucose, Bld 107 (*)    Calcium 8.1 (*)    AST 47 (*)    Total Bilirubin 6.9 (*)    All other components within normal limits  RETICULOCYTES - Abnormal; Notable for the following:    Retic Ct Pct >23.0 (*)    RBC. 1.56 (*)    All other components within normal limits  URINALYSIS, ROUTINE W REFLEX MICROSCOPIC - Abnormal; Notable for the following:    Color, Urine AMBER (*)    APPearance CLOUDY (*)    Hgb urine dipstick TRACE (*)    Protein, ur 30 (*)    All other components within normal limits  URINE MICROSCOPIC-ADD ON - Abnormal; Notable for the following:    Casts GRANULAR CAST (*)    All other components within normal limits  CULTURE, BLOOD (ROUTINE X 2)  CULTURE, BLOOD (ROUTINE X 2)  URINE CULTURE  BLOOD GAS, ARTERIAL  I-STAT CG4 LACTIC ACID, ED  I-STAT  TROPOININ, ED  TYPE AND SCREEN  PREPARE RBC (CROSSMATCH)    Imaging Review Ct Angio Chest Pe W/cm &/or Wo Cm  07/24/2014   CLINICAL DATA:  Sickle cell patient with chest pain and fever. Patient was sleeping during the examination.  EXAM: CT ANGIOGRAPHY CHEST WITH CONTRAST  TECHNIQUE: Multidetector CT imaging of the chest was performed using the standard protocol during bolus administration of intravenous contrast. Multiplanar CT image reconstructions and MIPs were obtained to evaluate the vascular anatomy.  CONTRAST:  OMNIPAQUE IOHEXOL 350 MG/ML SOLN  COMPARISON:  None.  FINDINGS: Left Infuse-A-Port with tip in the SVC.  Technically adequate study with moderately good opacification of the central and segmental pulmonary arteries. No focal filling defects are demonstrated. No evidence of significant pulmonary embolus.  Mild cardiac enlargement. Small pericardial effusion. Normal caliber thoracic aorta. No evidence of aortic dissection. Great vessel origins are patent. Esophagus is decompressed. No significant lymphadenopathy in the chest.  Consolidation demonstrated in both lung bases. No pleural effusions. No pneumothorax. Airways are patent.  Marked splenic atrophy, likely related to sickle cell. Included portions of the upper abdominal organs are otherwise grossly unremarkable. No destructive bone lesions.  Review of the MIP images confirms the above findings.  IMPRESSION: No evidence of significant pulmonary embolus. Cardiac enlargement with small pericardial effusion. Consolidation in both lung bases. Diffuse splenic atrophy likely related to sickle cell.   Electronically Signed   By: Burman Nieves M.D.   On: 07/24/2014 21:17     EKG Interpretation   Date/Time:  Thursday July 24 2014 17:57:15 EST Ventricular Rate:  111 PR Interval:  130 QRS Duration: 82 QT Interval:  306 QTC Calculation: 416 R Axis:   76 Text Interpretation:  Sinus tachycardia Left ventricular hypertrophy   Borderline T abnormalities, lateral leads No old tracing to compare  Confirmed by [facility]  MD, TREY ([id number]) on 07/24/2014 6:05:10 PM      MDM   Final diagnoses:  Fever  Sickle cell crisis acute chest syndrome  Sickle cell anemia with crisis  CAP (community acquired pneumonia)    BP 106/44 mmHg  Pulse 82  Temp(Src) 99.3 F (37.4 C) (Rectal)  Resp 13  Ht  (1.549 m)  Wt 136 lb (61.689 kg)  BMI 25.71 kg/m2  SpO2 100%  I have reviewed nursing notes and vital signs. I personally reviewed the imaging tests through PACS system  I reviewed available ER/hospitalization records thought the EMR     Fayrene Helper, PA-C 07/24/14 2145  Carlena Bjornstad [place]  III, MD 07/25/14 1096

## 2014-07-24 NOTE — Progress Notes (Signed)
ANTIBIOTIC CONSULT NOTE - INITIAL  Pharmacy Consult for Cefepime and Vancomycin Indication: HCAP  Allergies  Allergen Reactions  . Morphine And Related Hives    Patient states can take Dilaudid with benadryl  . Adhesive [Tape] Rash  . Latex Hives and Rash    Patient Measurements: Height: 5\' 1"  (154.9 cm) Weight: 136 lb (61.689 kg) IBW/kg (Calculated) : 52.3 Last documented weight 58.5 kg (03/27/13)  Vital Signs: Temp: 99.3 F (37.4 C) (02/11 1925) Temp Source: Rectal (02/11 1925) BP: 108/47 mmHg (02/11 2200) Pulse Rate: 82 (02/11 2130) Intake/Output from previous day:   Intake/Output from this shift: Total I/O In: -  Out: 600 [Urine:600]  Labs:  Recent Labs  07/24/14 1805  WBC 21.0*  HGB 5.5*  PLT 293  CREATININE 0.87   Estimated Creatinine Clearance: 86.8 mL/min (by C-G formula based on Cr of 0.87). No results for input(s): VANCOTROUGH, VANCOPEAK, VANCORANDOM, GENTTROUGH, GENTPEAK, GENTRANDOM, TOBRATROUGH, TOBRAPEAK, TOBRARND, AMIKACINPEAK, AMIKACINTROU, AMIKACIN in the last 72 hours.   Microbiology: No results found for this or any previous visit (from the past 720 hour(s)).  Medical History: Past Medical History  Diagnosis Date  . Sickle cell anemia   . Asthma   . 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 Heart murmur      Assessment: [age] yoM presented to ED on 2/11 with sickle cell crisis pain and fever.  Code sepsis called in ED with concern for possible acute chest.  Cefepime and Vancomycin per Rx for HCAP.   2/11 >> Ceftriaxone >> 2/11 2/11 >> Azithromycin >>  2/11 2/11 >> Cefepime >> 2/11 >> Vancomycin >>  Today, 07/24/2014:   Tmax: 100  WBCs: pending  Renal: SCr 0.87  Lactic acid: 0.89  Goal of Therapy:  Appropriate abx dosing, eradication of infection.   Plan:   Cefepime 1g IV q8h  Vancomycin 1250mg  IV q12h  F/u SCr/levels/cultures   Lorenza EvangelistGreen, Wynelle Dreier R 07/24/2014 11:21 PM

## 2014-07-24 NOTE — H&P (Signed)
[facility] History and Physical  Christopher Warren:[id number] DOB: 1976-02-20 DOA: 07/24/2014  Referring physician: ED physician PCP: [name], MD  Specialists:   Chief Complaint: Fever, shortness of breath, mild dry cough, mild chest pain and back pain  HPI: Christopher Warren is a 39 y.o. male with past medical history of asthma, sickle cell disease, history of jugular vein thrombosis (completed Coumadin treatment), who presents with fever, shortness of breath, mild dry cough, mild chest pain and severe back pain.  Patient reports that he started having fever yesterday morning with temperature 103.4. He also has mild shortness of breath, mild chest pain, mild cough without sputum production. Per his girlfriend, patient has been mildly drowsy, and has been sleeping more than usural. He also has severe back pain, but no significant worsening pain in his joints.   Patient denies abdominal pain, diarrhea, constipation, dysuria, urgency, frequency, hematuria, skin rashes or leg swelling. No unilateral weakness, numbness or tingling sensations. No vision change or hearing loss.  In ED, patient was found to have WBC 21, decreased hemoglobin from 7.5 to 5.5, negative troponin, lactate 0.89, electrolytes okay. CT angiogram is negative for pulmonary embolism, but showed bilateral infiltration or pneumonia. Temperature 100 in emergency room. ABG showed pH 7.325, PCO2 44.7, PaO2 106. Patient is admitted to inpatient for further evaluation and treatment.  Review of Systems: As presented in the history of presenting illness, rest negative.  Where does patient live?  At home Can patient participate in ADLs? little  Allergy:  Allergies  Allergen Reactions  . Morphine And Related Hives    Patient states can take Dilaudid with benadryl  . Adhesive [Tape] Rash  . Latex Hives and Rash    Past Medical History  Diagnosis Date  . Sickle cell anemia   . Asthma   . Hemoglobin S-S  disease 05/10/2011  . Heart murmur     Past Surgical History  Procedure Laterality Date  . Cholecystectomy    . 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  He has a 2.5 pack-year smoking history. He has never used smokeless tobacco. He reports that he does not drink alcohol or use illicit drugs.  Family History:  Family History  Problem Relation Age of Onset  . Sickle cell anemia Mother   . Sickle cell anemia Son      Prior to Admission medications   Medication Sig Start Date End Date Taking? Authorizing Provider  albuterol (PROVENTIL HFA;VENTOLIN HFA) 108 (90 BASE) MCG/ACT inhaler Inhale 2 puffs into the lungs every 6 (six) hours as needed for wheezing or shortness of breath (wheezing and shortness of breath).   Yes Historical Provider, MD  folic acid (FOLVITE) 1 MG tablet Take 2 pills a day. Patient taking differently: Take 2 mg by mouth daily. Take 2 tablets (2 mg) by mouth daily. 12/17/12  Yes Laveda Norman, MD  hydroxyurea (HYDREA) 500 MG capsule Take 1 capsule (500 mg total) by mouth 3 (three) times daily. Patient taking differently: Take 1,500 mg by mouth daily.  12/17/12  Yes Laveda Norman, MD  promethazine-phenylephrine (PROMETHAZINE VC) 6.25-5 MG/5ML SYRP Take 5 mLs by mouth every 4 (four) hours as needed for congestion (congestion).   Yes Historical Provider, MD  albuterol (PROVENTIL HFA;VENTOLIN HFA) 108 (90 BASE) MCG/ACT inhaler Inhale 2 puffs  into the lungs every 6 (six) hours as needed for wheezing. 02/14/12 03/24/13  Josph MachoPeter R Ennever, MD  deferasirox (EXJADE) 500 MG disintegrating tablet Take 4 tablets (2,000 mg total) by mouth daily. Patient not taking: Reported on 07/24/2014 12/17/12   Laveda Normanhris N Oti, MD  diphenhydrAMINE (BENADRYL) 25 mg capsule Take 25 mg by mouth every 6 (six) hours as needed. Itching.    Historical  Provider, MD  HYDROmorphone (DILAUDID) 8 MG tablet Take 1 tablet (8 mg total) by mouth every 6 (six) hours as needed. 03/24/13   Antony MaduraKelly Humes, PA-C  HYDROmorphone HCl 16 MG T24A Take 1 tablet (16 mg total) by mouth daily. Please fill on 5/29. Patient not taking: Reported on 07/24/2014 12/17/12   Laveda Normanhris N Oti, MD  temazepam (RESTORIL) 15 MG capsule Take 1 capsule (15 mg total) by mouth at bedtime as needed for sleep. 09/10/12   Josph MachoPeter R Ennever, MD    Physical Exam: Filed Vitals:   07/24/14 2127 07/24/14 2128 07/24/14 2130 07/24/14 2200  BP:   106/44 108/47  Pulse:   82   Temp:      TempSrc:      Resp:   13 12  Height:      Weight:      SpO2: 83% 84% 100% 100%   General: mild distress, drowsy.  HEENT:       Eyes: PERRL, EOMI, no scleral icterus       ENT: No discharge from the ears and nose, no pharynx injection, no tonsillar enlargement.        Neck: No JVD, no bruit, no mass felt. Cardiac: S1/S2, RRR, 1/6 systolic murmurs, No gallops or rubs Pulm: rhonchi and rales bilaterally Abd: Soft, nondistended, nontender, no rebound pain, no organomegaly, BS present Ext: No edema bilaterally. 2+DP/PT pulse bilaterally Musculoskeletal: tender over upper back Skin: No rashes.  Neuro: drowsy, but arousible , and oriented X3, cranial nerves II-XII grossly intact, muscle strength 5/5 in all extremeties, sensation to light touch intact. Brachial reflex 1+ bilaterally. Knee reflex 1+ bilaterally. Negative Babinski's sign. Psych: Patient is not psychotic, no suicidal or hemocidal ideation.  Labs on Admission:  Basic Metabolic Panel:  Recent Labs Lab 07/24/14 1805  NA 135  K 3.9  CL 107  CO2 21  GLUCOSE 107*  BUN 17  CREATININE 0.87  CALCIUM 8.1*   Liver Function Tests:  Recent Labs Lab 07/24/14 1805  AST 47*  ALT 26  ALKPHOS 96  BILITOT 6.9*  PROT 7.3  ALBUMIN 3.7   No results for input(s): LIPASE, AMYLASE in the last 168 hours. No results for input(s): AMMONIA in the last 168  hours. CBC:  Recent Labs Lab 07/24/14 1805  WBC 21.0*  NEUTROABS 14.5*  HGB 5.5*  HCT 15.2*  MCV 97.4  PLT 293   Cardiac Enzymes: No results for input(s): CKTOTAL, CKMB, CKMBINDEX, TROPONINI in the last 168 hours.  BNP (last 3 results) No results for input(s): BNP in the last 8760 hours.  ProBNP (last 3 results) No results for input(s): PROBNP in the last 8760 hours.  CBG: No results for input(s): GLUCAP in the last 168 hours.  Radiological Exams on Admission: Ct Angio Chest Pe W/cm &/or Wo Cm  07/24/2014   CLINICAL DATA:  Sickle cell patient with chest pain and fever. Patient was sleeping during the examination.  EXAM: CT ANGIOGRAPHY CHEST WITH CONTRAST  TECHNIQUE: Multidetector CT imaging of the chest was performed using the standard protocol during bolus administration of intravenous contrast.  Multiplanar CT image reconstructions and MIPs were obtained to evaluate the vascular anatomy.  CONTRAST:  OMNIPAQUE IOHEXOL 350 MG/ML SOLN  COMPARISON:  None.  FINDINGS: Left Infuse-A-Port with tip in the SVC.  Technically adequate study with moderately good opacification of the central and segmental pulmonary arteries. No focal filling defects are demonstrated. No evidence of significant pulmonary embolus.  Mild cardiac enlargement. Small pericardial effusion. Normal caliber thoracic aorta. No evidence of aortic dissection. Great vessel origins are patent. Esophagus is decompressed. No significant lymphadenopathy in the chest.  Consolidation demonstrated in both lung bases. No pleural effusions. No pneumothorax. Airways are patent.  Marked splenic atrophy, likely related to sickle cell. Included portions of the upper abdominal organs are otherwise grossly unremarkable. No destructive bone lesions.  Review of the MIP images confirms the above findings.  IMPRESSION: No evidence of significant pulmonary embolus. Cardiac enlargement with small pericardial effusion. Consolidation in both lung  bases. Diffuse splenic atrophy likely related to sickle cell.   Electronically Signed   By: Burman Nieves M.D.   On: 07/24/2014 21:17    EKG: Independently reviewed. T-wave inversion in V5 to V6, PR interval 130  Assessment/Plan Principal Problem:   Sickle cell crisis acute chest syndrome Active Problems:   Sickle cell anemia   Tobacco abuse   Acute chest syndrome  Sickle cell crisis acute chest syndrome and sickle cell anemia: Patient's presentations plus and CT angiogram findings for infiltration of consistency with the acute chest syndrome. Patient is septic on admission. Hemodynamically stable.  -Admit to tele bed given tachycardia. -IV prn dilaudid for pain (patient is drowsy, can not do PCA) -transfuse with 2 U of blood which were ordered by ED -Benadryl for itch -IVF: received 3 L of NS, followed by 125 cc/h -Zofran for nausea -start Vancomycin and Cefepime given his autosplenectomy -get procalcitonin and trend lactic acid level -trop x 3 -EKG in AM  Sickle anemia: patient's hemoglobin dropped to 7.5 to 5.5. Reticulocyte >23%, indicating not from aplastic anemia, more likely due to hemolysis.  - transfuse with 2 U of blood which were ordered by ED - check LDH and hatoglobin -will not check B19 virus -continue folic acid -continue hydroxyurea -Patient is not taking Deferasirox at home  Tobacco abuse: Smoked half pack a day for more than 15 years. -Nicotine patch  DVT ppx: SQ Heparin        Code Status: Full code Family Communication: Yes, patient's  girlfriend     at bed side Disposition Plan: Admit to inpatient   Date of Service 07/24/2014    Lorretta Harp [facility] Pager [phone]  If 7PM-7AM, please contact night-coverage [email or web address] Password [facility] 07/24/2014, 11:43 PM

## 2014-07-24 NOTE — ED Notes (Signed)
PA at bedside, unable to obtain EKG.

## 2014-07-24 NOTE — ED Notes (Signed)
Below orders not completed by EW. 

## 2014-07-24 NOTE — ED Notes (Signed)
Pt unable to void at this time. 

## 2014-07-24 NOTE — ED Notes (Signed)
Pt is unable to sign blood consent at this time. Pt responding only to pain. PA notified.

## 2014-07-24 NOTE — ED Notes (Signed)
Spoke with Tasha in the lab. They will add on the labs.

## 2014-07-24 NOTE — ED Notes (Signed)
RT notified of ABG order

## 2014-07-24 NOTE — ED Notes (Signed)
Report received from previous RN, Troshia. 

## 2014-07-24 NOTE — ED Notes (Signed)
Pt with Hx of sickle cell c/o fever 103 since yesterday.

## 2014-07-24 NOTE — ED Notes (Signed)
Pt is aware that a urine sample is needed.  

## 2014-07-24 NOTE — ED Notes (Addendum)
Pt trialed off O2 per PA.     07/24/14 2124 07/24/14 2125 07/24/14 2126  Oxygen Therapy  SpO2 95 % (!) 89 % (!) 85 %  O2 Device Room [employer] Room Air Room Air     07/24/14 2127 07/24/14 2128  Oxygen Therapy  SpO2 (!) 83 % (!) 84 %  O2 Device Room [employer] Room Air

## 2014-07-24 NOTE — Progress Notes (Signed)
Hydroxyurea (Hydrea) hold criteria:  ANC < 2  Pltc < 80K in sickle-cell patients; < 100K in other patients  Hgb <= 6 in sickle-cell patients; < 8 in other patients  Reticulocytes < 80K when Hgb < 9  Pt with Hgb=5.5 did not meet criteria for continuation of Hydrea at this time, pharmacy has d/c'd this medication.  Please f/u labs for restarting.   Thanks! Lorenza EvangelistGreen, Malaiya Paczkowski R 07/24/2014 11:10 PM

## 2014-07-24 NOTE — ED Notes (Signed)
Pt still unable to sign consent. Per PA, Fayrene HelperBowie Tran, hold blood transfusion for now.

## 2014-07-25 ENCOUNTER — Encounter (HOSPITAL_COMMUNITY): Payer: Self-pay | Admitting: *Deleted

## 2014-07-25 DIAGNOSIS — D5700 Hb-SS disease with crisis, unspecified: Secondary | ICD-10-CM

## 2014-07-25 DIAGNOSIS — E872 Acidosis: Secondary | ICD-10-CM

## 2014-07-25 DIAGNOSIS — J189 Pneumonia, unspecified organism: Secondary | ICD-10-CM

## 2014-07-25 DIAGNOSIS — D72829 Elevated white blood cell count, unspecified: Secondary | ICD-10-CM

## 2014-07-25 LAB — MRSA PCR SCREENING: MRSA by PCR: POSITIVE — AB

## 2014-07-25 LAB — PROTIME-INR
INR: 1.25 (ref 0.00–1.49)
PROTHROMBIN TIME: 15.8 s — AB (ref 11.6–15.2)

## 2014-07-25 LAB — PROCALCITONIN: Procalcitonin: 1.65 ng/mL

## 2014-07-25 LAB — APTT: APTT: 46 s — AB (ref 24–37)

## 2014-07-25 LAB — LACTIC ACID, PLASMA: LACTIC ACID, VENOUS: 0.6 mmol/L (ref 0.5–2.0)

## 2014-07-25 LAB — STREP PNEUMONIAE URINARY ANTIGEN: Strep Pneumo Urinary Antigen: NEGATIVE

## 2014-07-25 LAB — TROPONIN I: Troponin I: <0.03 ng/mL (ref <0.031)

## 2014-07-25 MED ORDER — HEPARIN SOD (PORK) LOCK FLUSH 100 UNIT/ML IV SOLN
500.0000 [IU] | INTRAVENOUS | Status: AC | PRN
  Administered 2014-07-25: 500 [IU]

## 2014-07-25 MED ORDER — HYDROXYUREA 500 MG PO CAPS: 1500.0000 mg | ORAL_CAPSULE | Freq: Every day | ORAL | Status: DC

## 2014-07-25 MED ORDER — CHLORHEXIDINE GLUCONATE CLOTH 2 % EX PADS: 6.0000 | MEDICATED_PAD | Freq: Every day | CUTANEOUS | Status: DC

## 2014-07-25 MED ORDER — HYDROMORPHONE HCL 2 MG/ML IJ SOLN
2.0000 mg | INTRAMUSCULAR | Status: DC | PRN
  Administered 2014-07-25: 2 mg via INTRAVENOUS
  Filled 2014-07-25: qty 1

## 2014-07-25 MED ORDER — SODIUM CHLORIDE 0.9 % IJ SOLN
10.0000 mL | INTRAMUSCULAR | Status: DC | PRN
  Administered 2014-07-25: 10 mL
  Filled 2014-07-25: qty 40

## 2014-07-25 MED ORDER — KETOROLAC TROMETHAMINE 30 MG/ML IJ SOLN: 30.0000 mg | Freq: Once | INTRAMUSCULAR | Status: DC

## 2014-07-25 MED ORDER — ALTEPLASE 2 MG IJ SOLR
2.0000 mg | Freq: Once | INTRAMUSCULAR | Status: AC
  Administered 2014-07-25: 2 mg
  Filled 2014-07-25: qty 2

## 2014-07-25 MED ORDER — MUPIROCIN 2 % EX OINT: 1.0000 "application " | TOPICAL_OINTMENT | Freq: Two times a day (BID) | CUTANEOUS | Status: DC

## 2014-07-25 MED ORDER — SODIUM CHLORIDE 0.9 % IJ SOLN
10.0000 mL | Freq: Two times a day (BID) | INTRAMUSCULAR | Status: DC
  Administered 2014-07-25: 20 mL

## 2014-07-25 NOTE — Progress Notes (Signed)
07/25/14 1000  MD in room to evaluate patient. Patient states he has a headache, not feeling good, and request temp to be taken cause he feels his fever may have come back. Temp taken 98.4 oral. MD ask the patient what brought him in to the ED. Pt responding with fever x 2days, pain, and not feeling good. MD advise pt that [facility] for [facility] we use PCA pump to help with controlling of pain. Pt states PCA does not work for him. MD tries to reassure patient, but pt began to get agitated stating that the MD was not listening to him. Then pt started asking about lab results, but the labs weren't due . I was in with the tubes to draw the labs, because they were due at 1000. Patient got out of bed while MD was trying to speak with him to get his shirt to put on.  Pt states he's leaving. Rt hand IV removed. IV team to deaccess port. Pt states he's going to [facility].

## 2014-07-25 NOTE — Progress Notes (Signed)
Pt c/o increased pain. Pt is in bed intermittently sleeping and very lethargic. Called midlevel awaiting call back.

## 2014-07-25 NOTE — Progress Notes (Signed)
CARE MANAGEMENT NOTE 07/25/2014  Patient:  Christopher Warren, Christopher Warren   Account Number:  [id number]  Date Initiated:  07/25/2014  Documentation initiated by:  Seanpatrick Maisano  Subjective/Objective Assessment:   sickle cell crisis     Action/Plan:   home when pain managment met.   Anticipated DC Date:  07/28/2014   Anticipated DC Plan:  HOME/SELF CARE  In-house referral  NA      DC Planning Services  CM consult      Choice offered to / List presented to:             Status of service:  In process, will continue to follow Medicare Important Message given?   (If response is "NO", the following Medicare IM given date fields will be blank) Date Medicare IM given:   Medicare IM given by:   Date Additional Medicare IM given:   Additional Medicare IM given by:    Discharge Disposition:    Per UR Regulation:  Reviewed for med. necessity/level of care/duration of stay  If discussed at [place] of Stay Meetings, dates discussed:    Comments:  Feb. 12 2016/Alexya Mcdaris L. Rosana Hoes, RN, BSN, CCM/Case Management [place] [phone] No discharge needs present of time of review.

## 2014-07-25 NOTE — Progress Notes (Signed)
07/25/14  1030  Patient left AMA. Pt refused to sign AMA form. Pt refused wheelchair. Pt and his girlfriend was walked out to elevators. Pt alert/oriented/steady on feet.

## 2014-07-25 NOTE — Progress Notes (Signed)
SICKLE CELL SERVICE PROGRESS NOTE  Christopher Warren ZOX:[id number] DOB: 02-Dec-1975 DOA: 07/24/2014 PCP: Billee Cashing, MD  Assessment/Plan: Principal Problem:   Sickle cell crisis acute chest syndrome Active Problems:   Sickle cell anemia   Tobacco abuse   Acute chest syndrome  1. Acute Chest Syndrome: Pt here with BLL Pneumonia. He is currently on Vancomycin, Azithromycin and Rocephin. He is hypoxic and this in a patient with Hb SS creates a high severity of illness. The goal is to get Hb between 8-10 g/dl. I will recheck his Hb/HCT. Obtain a Hb electrophoresis for % of Hb S. Continue current antibiotics and supportive oxygen therapy.  2. Mild Metabolic Acidosis: I suspect that this is secondary to the infection producing some lactic acid.I expect that with hydration and continued antibiotics this will improve.  3. Hb SS with crisis: Pt is opiate tolerant and has a history of becoming somnolent with as little as 2 mg of IV Dilaudid. He had the same response last night when given 2 mg of Dilaudid. I have discussed starting the PCA and patient became verbally offensive. I tried to explain that this was safest particularly in light of his hypoxia. I will change to Dilaudid PCA with up to 1 mg every 3 hours for breakthrough pain. Continue Toradol. 4. Hypoxemia: Secondary to BLL pneumonia. Pt non-adherent to supplemental oxygen, Continue to encourage use. 5. Leukocytosis: Secondary to Pneumonia. 6. Anemia: A review of his records shows a baseline Hb of 6.7. He is on hydrea and this could contribute to the low Hb. Hydrea on hold for now. He has been transfused 2 units PRBC and we will repeat his labs this morning and also include electrophoresis in the event that he progresses to the need for red cell apheresis.                 7. Chronic pain: Pt is chronically on Dilaudid 8 mg q 4 hours PRN. IT is unclear how much he utilizes Dilaudid. He currently does not take a long acting  medication. 8. Psychosocial: Pt has a very volatile attitude and is easily aggravated. He is verbally abusive and  is threatening to leave AMA. The patient is alert and oriented x 3 and I have discussed with him that he is very ill and that it is ill advised for him to leave with this severity of illness. Pt's significant other present in the room and also trying to influence him against leaving.   Code Status: Full Code Family Communication: Significant other at bedside during entire visit. Disposition Plan: Not yet ready for discharge  Deitrich Steve A.  Pager [phone]. If 7PM-7AM, please contact night-coverage.  07/25/2014, 10:03 AM  LOS: 1 day   Admitting History: Pt states that he went to his PMD because of fever Tm 103. He was seen by his PMD and advised to come to the ED for further evaluation. He was found to have a BLL pneumonia and admitted with Acute Chest Syndrome. He also reports that he has been feeling achy all over that is typical of pain associated with crisis.  Consultants:  None  Procedures:  None  Antibiotics:  Vancomycin 2/11>>  Rocephin 2/11 >>  Azithromycin 2/11 >>  HPI/Subjective: Pt began the conversation by demanding to know why his Hb was no re-drawn since he received a transfusion. He then pulled his Oxygen off and stated that he would wear it when he wanted to. I explained to the patient that I understood his concern  and that he was correct in his expectation of having a repeat Hb after a transfusion and that his labs would all be taken at the same time so as to minimize phlebotomy. He then expressed that he did not feel listened to or heard and wanted to know if I was the Physician from last night. I repeated that I was taking over his care today. I informed him that he was very ill with a BLL pneumonia and that he was receiving antibiotics. I explained that he would likely continue to feel ill for a few days until the antibiotics had a chance to exert  it's effect. He then stated that he does not use the PCA and that he knew what was best for him. He became verbally abusive using profanity and stating "We all black people and you should understand". I explained to the patient that my ethnicity would not influence my therapeutic plan and his treatment plan would be based on his condition. He then became even more verbally abusive and refused the examination.   Objective: Filed Vitals:   07/25/14 0700 07/25/14 0715 07/25/14 0730 07/25/14 0951  BP: 108/48 106/44 108/46   Pulse: 80 79 73 76  Temp:   97.6 F (36.4 C) 98.4 F (36.9 C)  TempSrc:   Oral Oral  Resp: Height:      Weight:      SpO2: 84% 94% 93% 100%   Weight change:   Intake/Output Summary (Last 24 hours) at 07/25/14 1003 Last data filed at 07/25/14 0730  Gross per 24 hour  Intake   3585 ml  Output   1400 ml  Net   2185 ml    General: Alert, awake, oriented x3, in no acute distress with mild increase in WOB. HEENT: [place]/AT, EOMI, anicteric Heart: Regular rate and rhythm, without murmurs, rubs, gallops, PMI non-displaced, no heaves or thrills on palpation.  Lungs: Refused Abdomen: Refused Neuro: Pt observed moving all extremities against gravity and jumping out of bed and walking around room.  Musculoskeletal: Refused Psychiatric: Patient alert and oriented x3, poor insight but normal cognition, good recent to remote recall.    Data Reviewed: Basic Metabolic Panel:  Recent Labs Lab 07/24/14 1805  NA 135  K 3.9  CL 107  CO2 21  GLUCOSE 107*  BUN 17  CREATININE 0.87  CALCIUM 8.1*   Liver Function Tests:  Recent Labs Lab 07/24/14 1805  AST 47*  ALT 26  ALKPHOS 96  BILITOT 6.9*  PROT 7.3  ALBUMIN 3.7   No results for input(s): LIPASE, AMYLASE in the last 168 hours. No results for input(s): AMMONIA in the last 168 hours. CBC:  Recent Labs Lab 07/24/14 1805  WBC 21.0*  NEUTROABS 14.5*  HGB 5.5*  HCT 15.2*  MCV 97.4  PLT 293    Cardiac Enzymes:  Recent Labs Lab 07/24/14 2259  TROPONINI <0.03   BNP (last 3 results) No results for input(s): BNP in the last 8760 hours.  ProBNP (last 3 results) No results for input(s): PROBNP in the last 8760 hours.  CBG: No results for input(s): GLUCAP in the last 168 hours.  Recent Results (from the past 240 hour(s))  MRSA PCR Screening     Status: Abnormal   Collection Time: 07/24/14 10:58 PM  Result Value Ref Range Status   MRSA by PCR POSITIVE (A) NEGATIVE Final    Comment:        The GeneXpert MRSA Assay (FDA approved  for NASAL specimens only), is one component of a comprehensive MRSA colonization surveillance program. It is not intended to diagnose MRSA infection nor to guide or monitor treatment for MRSA infections. RESULT CALLED TO, READ BACK BY AND VERIFIED WITH: C. DENNY RN AT 0510 ON 02.12.16 BY SHUEA      Studies: Ct Angio Chest Pe W/cm &/or Wo Cm  07/24/2014   CLINICAL DATA:  Sickle cell patient with chest pain and fever. Patient was sleeping during the examination.  EXAM: CT ANGIOGRAPHY CHEST WITH CONTRAST  TECHNIQUE: Multidetector CT imaging of the chest was performed using the standard protocol during bolus administration of intravenous contrast. Multiplanar CT image reconstructions and MIPs were obtained to evaluate the vascular anatomy.  CONTRAST:  100mL OMNIPAQUE IOHEXOL 350 MG/ML SOLN  COMPARISON:  None.  FINDINGS: Left Infuse-A-Port with tip in the SVC.  Technically adequate study with moderately good opacification of the central and segmental pulmonary arteries. No focal filling defects are demonstrated. No evidence of significant pulmonary embolus.  Mild cardiac enlargement. Small pericardial effusion. Normal caliber thoracic aorta. No evidence of aortic dissection. Great vessel origins are patent. Esophagus is decompressed. No significant lymphadenopathy in the chest.  Consolidation demonstrated in both lung bases. No pleural effusions. No  pneumothorax. Airways are patent.  Marked splenic atrophy, likely related to sickle cell. Included portions of the upper abdominal organs are otherwise grossly unremarkable. No destructive bone lesions.  Review of the MIP images confirms the above findings.  IMPRESSION: No evidence of significant pulmonary embolus. Cardiac enlargement with small pericardial effusion. Consolidation in both lung bases. Diffuse splenic atrophy likely related to sickle cell.   Electronically Signed   By: Burman NievesWilliam  Stevens M.D.   On: 07/24/2014 21:17    Scheduled Meds: . ceFEPime (MAXIPIME) IV  1 g Intravenous 3 times per day  . Chlorhexidine Gluconate Cloth  6 each Topical Q0600  . folic acid  2 mg Oral Daily  . heparin  5,000 Units Subcutaneous 3 times per day  . hydroxyurea  1,500 mg Oral Daily  . ketorolac  30 mg Intravenous Once  . mupirocin ointment  1 application Nasal BID  . nicotine  14 mg Transdermal Daily  . sodium chloride  10-40 mL Intracatheter Q12H  . vancomycin  1,250 mg Intravenous Q12H   Continuous Infusions: . dextrose 5 % and 0.45% NaCl 125 mL/hr at 07/25/14 0015    Principal Problem:   Sickle cell crisis acute chest syndrome Active Problems:   Sickle cell anemia   Tobacco abuse   Acute chest syndrome

## 2014-07-26 LAB — URINE CULTURE
Colony Count: NO GROWTH
Culture: NO GROWTH

## 2014-07-26 LAB — HAPTOGLOBIN: Haptoglobin: <10 mg/dL — ABNORMAL LOW (ref 34–200)

## 2014-07-27 LAB — TYPE AND SCREEN
ABO/RH(D): O POS
ANTIBODY SCREEN: NEGATIVE
UNIT DIVISION: 0
Unit division: 0

## 2014-07-28 LAB — LEGIONELLA ANTIGEN, URINE

## 2014-07-28 NOTE — Discharge Summary (Signed)
Christopher Warren MRN: [id number] DOB/AGE: 37-Jan-1977 36 y.o.  Admit date: 07/24/2014 Discharge date: 07/28/2014  Primary Care Physician:  Billee Cashing, MD   Discharge Diagnoses:   Patient Active Problem List   Diagnosis Date Noted  . CAP (community acquired pneumonia) 07/24/2014  . Tobacco abuse 07/24/2014  . Sickle cell crisis acute chest syndrome 07/24/2014  . Acute chest syndrome 07/24/2014  . 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 Sickle cell anemia 03/27/2013  . Internal jugular (IJ) vein thromboembolism, acute 12/11/2012  . Sickle cell crisis 01/27/2012  . Fever 11/12/2011  . Leukocytosis 10/04/2011  . 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     albuterol 108 (90 BASE) MCG/ACT inhaler  Commonly known as:  PROVENTIL HFA;VENTOLIN HFA  Inhale 2 puffs into the lungs every 6 (six) hours as needed for wheezing.     deferasirox 500 MG disintegrating tablet  Commonly known as:  EXJADE  Take 4 tablets (2,000 mg total) by mouth daily.     diphenhydrAMINE 25 mg capsule  Commonly known as:  BENADRYL  Take 25 mg by mouth every 6 (six) hours as needed. Itching.     folic acid 1 MG tablet  Commonly known as:  FOLVITE  Take 2 pills a day.     HYDROmorphone HCl 16 MG T24a  Take 1 tablet (16 mg total) by mouth daily. Please fill on 5/29.     HYDROmorphone 8 MG tablet  Commonly known as:  DILAUDID  Take 1 tablet (8 mg total) by mouth every 6 (six) hours as needed.     hydroxyurea 500 MG capsule  Commonly known as:  HYDREA  Take 1 capsule (500 mg total) by mouth 3 (three) times daily.     promethazine-phenylephrine 6.25-5 MG/5ML Syrp  Commonly known as:   PROMETHAZINE VC  Take 5 mLs by mouth every 4 (four) hours as needed for congestion (congestion).     temazepam 15 MG capsule  Commonly known as:  RESTORIL  Take 1 capsule (15 mg total) by mouth at bedtime as needed for sleep.          Consults:     SIGNIFICANT DIAGNOSTIC STUDIES:  Ct Angio Chest Pe W/cm &/or Wo Cm  07/24/2014   CLINICAL DATA:  Sickle cell patient with chest pain and fever. Patient was sleeping during the examination.  EXAM: CT ANGIOGRAPHY CHEST WITH CONTRAST  TECHNIQUE: Multidetector CT imaging of the chest was performed using the standard protocol during bolus administration of intravenous contrast. Multiplanar CT image reconstructions and MIPs were obtained to evaluate the vascular anatomy.  CONTRAST:  OMNIPAQUE IOHEXOL 350 MG/ML SOLN  COMPARISON:  None.  FINDINGS: Left Infuse-A-Port with tip in the SVC.  Technically adequate study with moderately good opacification of the central and segmental pulmonary arteries. No focal filling defects are demonstrated. No evidence of significant pulmonary embolus.  Mild cardiac enlargement. Small pericardial effusion. Normal caliber thoracic aorta. No evidence of aortic dissection. Great vessel origins are patent. Esophagus is decompressed. No significant lymphadenopathy in the chest.  Consolidation demonstrated in both lung bases. No pleural effusions. No pneumothorax. Airways are patent.  Marked splenic atrophy,  likely related to sickle cell. Included portions of the upper abdominal organs are otherwise grossly unremarkable. No destructive bone lesions.  Review of the MIP images confirms the above findings.  IMPRESSION: No evidence of significant pulmonary embolus. Cardiac enlargement with small pericardial effusion. Consolidation in both lung bases. Diffuse splenic atrophy likely related to sickle cell.   Electronically Signed   By: Burman NievesWilliam  Stevens M.D.   On: 07/24/2014 21:17       Recent Results (from the past 240 hour(s))   Blood Culture (routine x 2)     Status: None (Preliminary result)   Collection Time: 07/24/14  6:08 PM  Result Value Ref Range Status   Specimen Description LEFT ANTECUBITAL  Final   Special Requests BOTTLES DRAWN AEROBIC AND ANAEROBIC 5CC  Final   Culture   Final           BLOOD CULTURE RECEIVED NO GROWTH TO DATE CULTURE WILL BE HELD FOR 5 DAYS BEFORE ISSUING A FINAL NEGATIVE REPORT [facility]    Report Status PENDING  Incomplete  Blood Culture (routine x 2)     Status: None (Preliminary result)   Collection Time: 07/24/14  6:08 PM  Result Value Ref Range Status   Specimen Description BLOOD RIGHT ARM  Final   Special Requests BOTTLES DRAWN AEROBIC AND ANAEROBIC 3CC  Final   Culture   Final           BLOOD CULTURE RECEIVED NO GROWTH TO DATE CULTURE WILL BE HELD FOR 5 DAYS BEFORE ISSUING A FINAL NEGATIVE REPORT Performed at [facility]    Report Status PENDING  Incomplete  Urine culture     Status: None   Collection Time: 07/24/14  8:12 PM  Result Value Ref Range Status   Specimen Description URINE, CATHETERIZED  Final   Special Requests NONE  Final   Colony Count NO GROWTH Performed at [facility]   Final   Culture NO GROWTH Performed at [facility]   Final   Report Status 07/26/2014 FINAL  Final  MRSA PCR Screening     Status: Abnormal   Collection Time: 07/24/14 10:58 PM  Result Value Ref Range Status   MRSA by PCR POSITIVE (A) NEGATIVE Final    Comment:        The GeneXpert MRSA Assay (FDA approved for NASAL specimens only), is one component of a comprehensive MRSA colonization surveillance program. It is not intended to diagnose MRSA infection nor to guide or monitor treatment for MRSA infections. RESULT CALLED TO, READ BACK BY AND VERIFIED WITH: Katherina Right. DENNY RN AT 0510 ON 02.12.16 BY SHUEA     BRIEF ADMITTING H & P:  Christopher Warren is a 39 y.o. male with past medical history of asthma, sickle cell disease,  history of jugular vein thrombosis (completed Coumadin treatment), who presents with fever, shortness of breath, mild dry cough, mild chest pain and severe back pain.  Patient reports that he started having fever yesterday morning with temperature 103.4. He also has mild shortness of breath, mild chest pain, mild cough without sputum production. Per his girlfriend, patient has been mildly drowsy, and has been sleeping more than usual. He also has severe back pain, but no significant worsening pain in his joints.   Patient denies abdominal pain, diarrhea, constipation, dysuria, urgency, frequency, hematuria, skin rashes or leg swelling. No unilateral weakness, numbness or tingling sensations. No vision change or hearing loss.  In ED, patient was found to  have WBC 21, decreased hemoglobin from 7.5 to 5.5, negative troponin, lactate 0.89, electrolytes okay. CT angiogram is negative for pulmonary embolism, but showed bilateral infiltration or pneumonia. Temperature 100 in emergency room. ABG showed pH 7.325, PCO2 44.7, PaO2 106. Patient is admitted to inpatient for further evaluation and treatment.   Hospital Course:  Present on Admission:  . Acute Chest Syndrome: Please see note from 2/12 2016 for details of hospitalization. However in a nutshell, pt was admitted with a high severity of illness including Acute Chest syndrome, CAP, metabolic acidosis, hypoxemia, anemia and Hb SS with crisis. He was appropriately treated with antibiotics, blood transfusion, supplemental oxygen and iv analgesics. He was non adherent to using oxygen. He became verbally abusive and left AMA despite being informed of the severity of his illness.  . Sickle cell anemia: See note from 07/25/2014. . Tobacco abuse: See note from 07/25/2014. . Sickle cell crisis acute chest syndrome: See note from 07/25/2014. Marland Kitchen Acute chest syndrome: See note from 07/25/2014.  Disposition and Follow-up: I called his PMD Dr. Ronne Binning and made him aware  of the patients condition and disposition. Dr. Ronne Binning reported that he was a difficult patient and that Dr. Ronne Binning has also had difficulty with the patient. He stated that he would try to speak with patient however patient left before Dr. Ronne Binning could speak with him.   DISCHARGE EXAM:  See note from 2/212/2016.  Blood pressure 99/40, pulse 76, temperature 98.4 F (36.9 C), temperature source Oral, resp. rate 10, height  (1.651 m), weight 126 lb 5.2 oz (57.3 kg), SpO2 100 %.  No results for input(s): NA, K, CL, CO2, GLUCOSE, BUN, CREATININE, CALCIUM, MG, PHOS in the last 72 hours. No results for input(s): AST, ALT, ALKPHOS, BILITOT, PROT, ALBUMIN in the last 72 hours. No results for input(s): LIPASE, AMYLASE in the last 72 hours. No results for input(s): WBC, NEUTROABS, HGB, HCT, MCV, PLT in the last 72 hours.   Total time spent including face to face and decision making was <30 minutes. Signed: Kalese Ensz A. 07/28/2014, 12:42 PM

## 2014-07-29 LAB — RESPIRATORY VIRUS PANEL
Adenovirus: NEGATIVE
INFLUENZA B 1: NEGATIVE
Influenza A: NEGATIVE
METAPNEUMOVIRUS: NEGATIVE
PARAINFLUENZA 2 A: NEGATIVE
Parainfluenza 1: NEGATIVE
Parainfluenza 3: NEGATIVE
RESPIRATORY SYNCYTIAL VIRUS A: NEGATIVE
RHINOVIRUS: NEGATIVE
Respiratory Syncytial Virus B: NEGATIVE

## 2014-07-31 LAB — CULTURE, BLOOD (ROUTINE X 2)
Culture: NO GROWTH
Culture: NO GROWTH

## 2015-11-12 DEATH — deceased

## 2023-02-21 ENCOUNTER — Other Ambulatory Visit: Payer: Self-pay | Admitting: Pharmacist
